# Patient Record
Sex: Female | Born: 1940 | Race: White | Hispanic: No | Marital: Married | Smoking: Never smoker
Health system: Southern US, Community
[De-identification: ages and names within clinical notes are randomized; demographics above are authoritative.]

## PROBLEM LIST (undated history)

## (undated) DIAGNOSIS — Z9889 Other specified postprocedural states: Secondary | ICD-10-CM

## (undated) DIAGNOSIS — K2270 Barrett's esophagus without dysplasia: Secondary | ICD-10-CM

## (undated) DIAGNOSIS — I10 Essential (primary) hypertension: Secondary | ICD-10-CM

## (undated) DIAGNOSIS — R000 Tachycardia, unspecified: Secondary | ICD-10-CM

## (undated) DIAGNOSIS — R112 Nausea with vomiting, unspecified: Secondary | ICD-10-CM

## (undated) DIAGNOSIS — Z8719 Personal history of other diseases of the digestive system: Secondary | ICD-10-CM

## (undated) DIAGNOSIS — G43909 Migraine, unspecified, not intractable, without status migrainosus: Secondary | ICD-10-CM

## (undated) DIAGNOSIS — K5792 Diverticulitis of intestine, part unspecified, without perforation or abscess without bleeding: Secondary | ICD-10-CM

## (undated) DIAGNOSIS — M461 Sacroiliitis, not elsewhere classified: Secondary | ICD-10-CM

## (undated) DIAGNOSIS — M069 Rheumatoid arthritis, unspecified: Secondary | ICD-10-CM

## (undated) DIAGNOSIS — Z853 Personal history of malignant neoplasm of breast: Secondary | ICD-10-CM

## (undated) DIAGNOSIS — C801 Malignant (primary) neoplasm, unspecified: Secondary | ICD-10-CM

## (undated) DIAGNOSIS — L309 Dermatitis, unspecified: Secondary | ICD-10-CM

## (undated) HISTORY — DX: Tachycardia, unspecified: R00.0

## (undated) HISTORY — DX: Barrett's esophagus without dysplasia: K22.70

## (undated) HISTORY — DX: Dermatitis, unspecified: L30.9

## (undated) HISTORY — PX: WEDGE RESECTION: SHX5070

## (undated) HISTORY — PX: ABDOMINAL HYSTERECTOMY: SHX81

## (undated) HISTORY — DX: Essential (primary) hypertension: I10

## (undated) HISTORY — DX: Diverticulitis of intestine, part unspecified, without perforation or abscess without bleeding: K57.92

## (undated) HISTORY — DX: Personal history of other diseases of the digestive system: Z87.19

## (undated) HISTORY — DX: Sacroiliitis, not elsewhere classified: M46.1

## (undated) HISTORY — DX: Personal history of malignant neoplasm of breast: Z85.3

## (undated) HISTORY — DX: Rheumatoid arthritis, unspecified: M06.9

---

## 1996-12-26 HISTORY — PX: BREAST LUMPECTOMY: SHX2

## 2020-01-27 DIAGNOSIS — G459 Transient cerebral ischemic attack, unspecified: Secondary | ICD-10-CM

## 2020-01-27 HISTORY — DX: Transient cerebral ischemic attack, unspecified: G45.9

## 2020-03-26 HISTORY — PX: NEUROMA SURGERY: SHX722

## 2020-06-19 NOTE — Progress Notes (Signed)
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   History of Present Illness: Victoria Sharp is a 79 y.o. female seen in consultation per request of her PCP.  According to the patient about 14 months ago she started having pain and swelling in her both hands.  She was having difficulty making a fist.  She also was experiencing discomfort in her right foot for which she was seen by podiatrist.  She underwent right Morton's neuroma surgery and her symptoms improved as regards to her foot.  She states the symptoms of swelling in her hands continue to get worse and recently she started having increased pain and swelling in her left fifth finger.  She states she was seen by her PCP in May 2021 at the time labs showed positive rheumatoid factor.  She continued to have increased swelling and she was given prednisone for 1 week which was very helpful.  She was also placed on methotrexate 4 tablets p.o. weekly which she is taken for last 3 weeks.  She has noticed improvement in her symptoms.  None of the other joints are painful or swollen.  She has been tolerating methotrexate well.  There is no family history of rheumatoid arthritis.  Activities of Daily Living:  Patient reports morning stiffness for 45 minutes.   Patient Denies nocturnal pain.  Difficulty dressing/grooming: Denies Difficulty climbing stairs: Denies Difficulty getting out of chair: Denies Difficulty using hands for taps, buttons, cutlery, and/or writing: Denies  Review of Systems  Constitutional: Positive for fatigue. Negative for night sweats, weight gain and weight loss.  HENT: Negative for mouth sores, trouble swallowing, trouble swallowing, mouth dryness and  nose dryness.   Eyes: Positive for dryness. Negative for pain, redness and visual disturbance.  Respiratory: Negative for cough, shortness of breath and difficulty breathing.   Cardiovascular: Negative for chest pain, palpitations, hypertension, irregular heartbeat and swelling in legs/feet.  Gastrointestinal: Positive for diarrhea. Negative for blood in stool and constipation.       Occasional  Endocrine: Negative for increased urination.  Genitourinary: Negative for vaginal dryness.  Musculoskeletal: Positive for arthralgias, joint pain, joint swelling and morning stiffness. Negative for myalgias, muscle weakness, muscle tenderness and myalgias.  Skin: Negative for color change, rash, hair loss, skin tightness, ulcers and sensitivity to sunlight.  Allergic/Immunologic: Negative for susceptible to infections.  Neurological: Negative for dizziness, memory loss, night sweats and weakness.  Hematological: Negative for swollen glands.  Psychiatric/Behavioral: Negative for depressed mood and sleep disturbance. The patient is not nervous/anxious.     PMFS History:  There are no problems to display for this patient.   Past Medical History:  Diagnosis Date  . History of breast cancer   . Hypertension   . Rheumatoid arthritis ([place])   . Tachycardia     Family History  Problem Relation Age of Onset  . Heart attack Father   . Kidney disease Brother   . Cancer Brother    Past Surgical History:  Procedure Laterality Date  . ABDOMINAL HYSTERECTOMY    . BREAST LUMPECTOMY Left 1998  . NEUROMA SURGERY Right 03/2020  .  WEDGE RESECTION     Social History   Social History Narrative  . Not on file    There is no immunization history on file for this patient.   Objective: Vital Signs: BP (!) 158/98 (BP Location: Right Arm, Patient Position: Sitting, Cuff Size: Small)   Pulse 77   Resp 12   Ht _0  (1.575 m)   Wt 148 lb 6.4 oz (67.3 kg)   BMI 27.14 kg/m    Physical Exam Vitals and  nursing note reviewed.  Constitutional:      Appearance: She is well-developed.  HENT:     Head: Normocephalic and atraumatic.  Eyes:     Conjunctiva/sclera: Conjunctivae normal.  Cardiovascular:     Rate and Rhythm: Normal rate and regular rhythm.     Heart sounds: Normal heart sounds.  Pulmonary:     Effort: Pulmonary effort is normal.     Breath sounds: Normal breath sounds.  Abdominal:     General: Bowel sounds are normal.     Palpations: Abdomen is soft.  Musculoskeletal:     Cervical back: Normal range of motion.  Lymphadenopathy:     Cervical: No cervical adenopathy.  Skin:    General: Skin is warm and dry.     Capillary Refill: Capillary refill takes less than 2 seconds.  Neurological:     Mental Status: She is alert and oriented to person, place, and time.  Psychiatric:        Behavior: Behavior normal.      Musculoskeletal Exam: C-spine thoracic and lumbar spine with good range of motion.  CDAI Exam: CDAI Score: 13  Patient Global: 5 mm; Provider Global: 5 mm Swollen: 6 ; Tender: 7  Joint Exam 06/26/2020      Right  Left  MCP 2  Swollen Tender  Swollen Tender  MCP 3  Swollen Tender     MCP 5  Swollen Tender  Swollen Tender  PIP 5     Swollen Tender  Tarsometatarsal   Tender        Investigation: No additional findings. Labs from [facility] May 31, 2020 CBC normal,  RF 320,Anti-CCP 130.41, ESR 14, CRP less than 5, ANA 1: 160 homogeneous,  September 16, 2019 CMP showed GFR 52  March 06, 2019 hepatitis C nonreactive  Imaging: No results found.  Recent Labs: No results found for: WBC, HGB, PLT, NA, K, CL, CO2, GLUCOSE, BUN, CREATININE, BILITOT, ALKPHOS, AST, ALT, PROT, ALBUMIN, CALCIUM, GFRAA, QFTBGOLD, QFTBGOLDPLUS  Speciality Comments: No specialty comments available.  Procedures:  No procedures performed Allergies: Contrast media [iodinated diagnostic agents], Latex, Metronidazole, and Sulfamethoxazole-trimethoprim   Assessment  / Plan:     Visit Diagnoses: Rheumatoid arthritis with rheumatoid factor of multiple sites without organ or systems involvement ([place]) - 05/14/20: RF 320, CCP 130.41, ESR 14, CRP<5.  Patient has synovitis in several of her MCPs and PIPs today.  She also has positive rheumatoid factor and positive anti-CCP.  We are detailed discussion regarding rheumatoid arthritis.  She did better after the course of prednisone.  She was also placed on methotrexate about 3 weeks ago.  She has noticed some improvement in her symptoms.  Patient has low GFR.  I would like to repeat labs today.  We may have to switch her to some other medication because of low GFR if it deteriorates further.  I discussed possible use of leflunomide.  She will also need some baseline labs and a chest x-ray.  Pain in  both hands -she has pain and swelling in her MCPs and PIPs.  Plan: XR Hand 2 View Right, XR Hand 2 View Left.  X-ray showed osteoarthritic changes.  Pain in both feet -she complains of some discomfort in her feet but no tenderness was noted.  She had recent Morton's neuroma resection which was helpful.  Plan: XR Foot 2 Views Right, XR Foot 2 Views Left.  X-ray showed osteoarthritic changes.  High risk medication use - Plan: DG Chest 2 View, CBC with Differential/Platelet, COMPLETE METABOLIC PANEL WITH GFR, Hepatitis B core antibody, IgM, Hepatitis B surface antigen, HIV Antibody (routine testing w rflx), QuantiFERON-TB Gold Plus, Serum protein electrophoresis with reflex, IgG, IgA, IgM  Positive ANA (antinuclear antibody) -patient is low titer ANA does not have any clinical features of autoimmune disease.  Plan: RNP Antibody, Sjogrens syndrome-A extractable nuclear antibody, Sjogrens syndrome-B extractable nuclear antibody, Anti-Smith antibody, Anti-DNA antibody, double-stranded  Essential hypertension-blood pressure is elevated today.  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  Vitamin D deficiency-in the past per patient.  History of breast cancer - 1998, left breast, lumpectomy and RTx   Orders: Orders Placed This Encounter  Procedures  . XR Hand 2 View Right  . XR Hand 2 View Left  . XR Foot 2 Views Right  . XR Foot 2 Views Left  . DG Chest 2 View  . CBC with Differential/Platelet  . COMPLETE METABOLIC PANEL WITH GFR  . Hepatitis B core antibody, IgM  . Hepatitis B surface antigen  . HIV Antibody (routine testing w rflx)  . QuantiFERON-TB Gold Plus  . Serum protein electrophoresis with reflex  . IgG, IgA, IgM  . RNP Antibody  . Sjogrens syndrome-A extractable nuclear antibody  . Sjogrens syndrome-B extractable nuclear antibody  . Anti-Smith antibody  . Anti-DNA antibody, double-stranded   No orders of the defined types were placed in this encounter.    Follow-Up Instructions: Return in about 2 weeks (around 07/10/2020) for Rheumatoid arthritis.   Bo Merino, MD  Note - This record has been created using Editor, commissioning.  Chart creation errors have been sought, but may not always  have been located. Such creation errors do not reflect on  the standard of medical care.

## 2020-06-26 ENCOUNTER — Ambulatory Visit: Payer: Self-pay

## 2020-06-26 ENCOUNTER — Encounter (INDEPENDENT_AMBULATORY_CARE_PROVIDER_SITE_OTHER): Payer: Self-pay

## 2020-06-26 ENCOUNTER — Other Ambulatory Visit: Payer: Self-pay

## 2020-06-26 ENCOUNTER — Ambulatory Visit (HOSPITAL_BASED_OUTPATIENT_CLINIC_OR_DEPARTMENT_OTHER)
Admission: RE | Admit: 2020-06-26 | Discharge: 2020-06-26 | Disposition: A | Discharge status: Home / Self Care | Payer: Medicare Other | Source: Ambulatory Visit | Attending: Rheumatology | Admitting: Rheumatology

## 2020-06-26 ENCOUNTER — Encounter: Payer: Self-pay | Admitting: Rheumatology

## 2020-06-26 ENCOUNTER — Ambulatory Visit (INDEPENDENT_AMBULATORY_CARE_PROVIDER_SITE_OTHER): Payer: Medicare Other | Admitting: Rheumatology

## 2020-06-26 VITALS — BP 158/98 | HR 77 | Resp 12 | Ht 62.0 in | Wt 148.4 lb

## 2020-06-26 DIAGNOSIS — M79642 Pain in left hand: Secondary | ICD-10-CM

## 2020-06-26 DIAGNOSIS — M0579 Rheumatoid arthritis with rheumatoid factor of multiple sites without organ or systems involvement: Secondary | ICD-10-CM

## 2020-06-26 DIAGNOSIS — M79672 Pain in left foot: Secondary | ICD-10-CM

## 2020-06-26 DIAGNOSIS — M79671 Pain in right foot: Secondary | ICD-10-CM

## 2020-06-26 DIAGNOSIS — I341 Nonrheumatic mitral (valve) prolapse: Secondary | ICD-10-CM

## 2020-06-26 DIAGNOSIS — M79641 Pain in right hand: Secondary | ICD-10-CM

## 2020-06-26 DIAGNOSIS — Z79899 Other long term (current) drug therapy: Secondary | ICD-10-CM | POA: Diagnosis present

## 2020-06-26 DIAGNOSIS — R768 Other specified abnormal immunological findings in serum: Secondary | ICD-10-CM

## 2020-06-26 DIAGNOSIS — E559 Vitamin D deficiency, unspecified: Secondary | ICD-10-CM

## 2020-06-26 DIAGNOSIS — I10 Essential (primary) hypertension: Secondary | ICD-10-CM

## 2020-06-26 DIAGNOSIS — Z8719 Personal history of other diseases of the digestive system: Secondary | ICD-10-CM

## 2020-06-26 DIAGNOSIS — N183 Chronic kidney disease, stage 3 unspecified: Secondary | ICD-10-CM

## 2020-06-26 DIAGNOSIS — Z853 Personal history of malignant neoplasm of breast: Secondary | ICD-10-CM

## 2020-06-26 IMAGING — DX DG CHEST 2V
2 series · 2 of 2 positions shown · non-contrast
Comparison: None.

CLINICAL DATA: Recent diagnosis of rheumatoid arthritis.

EXAM:
CHEST - 2 VIEW

[chest pa]
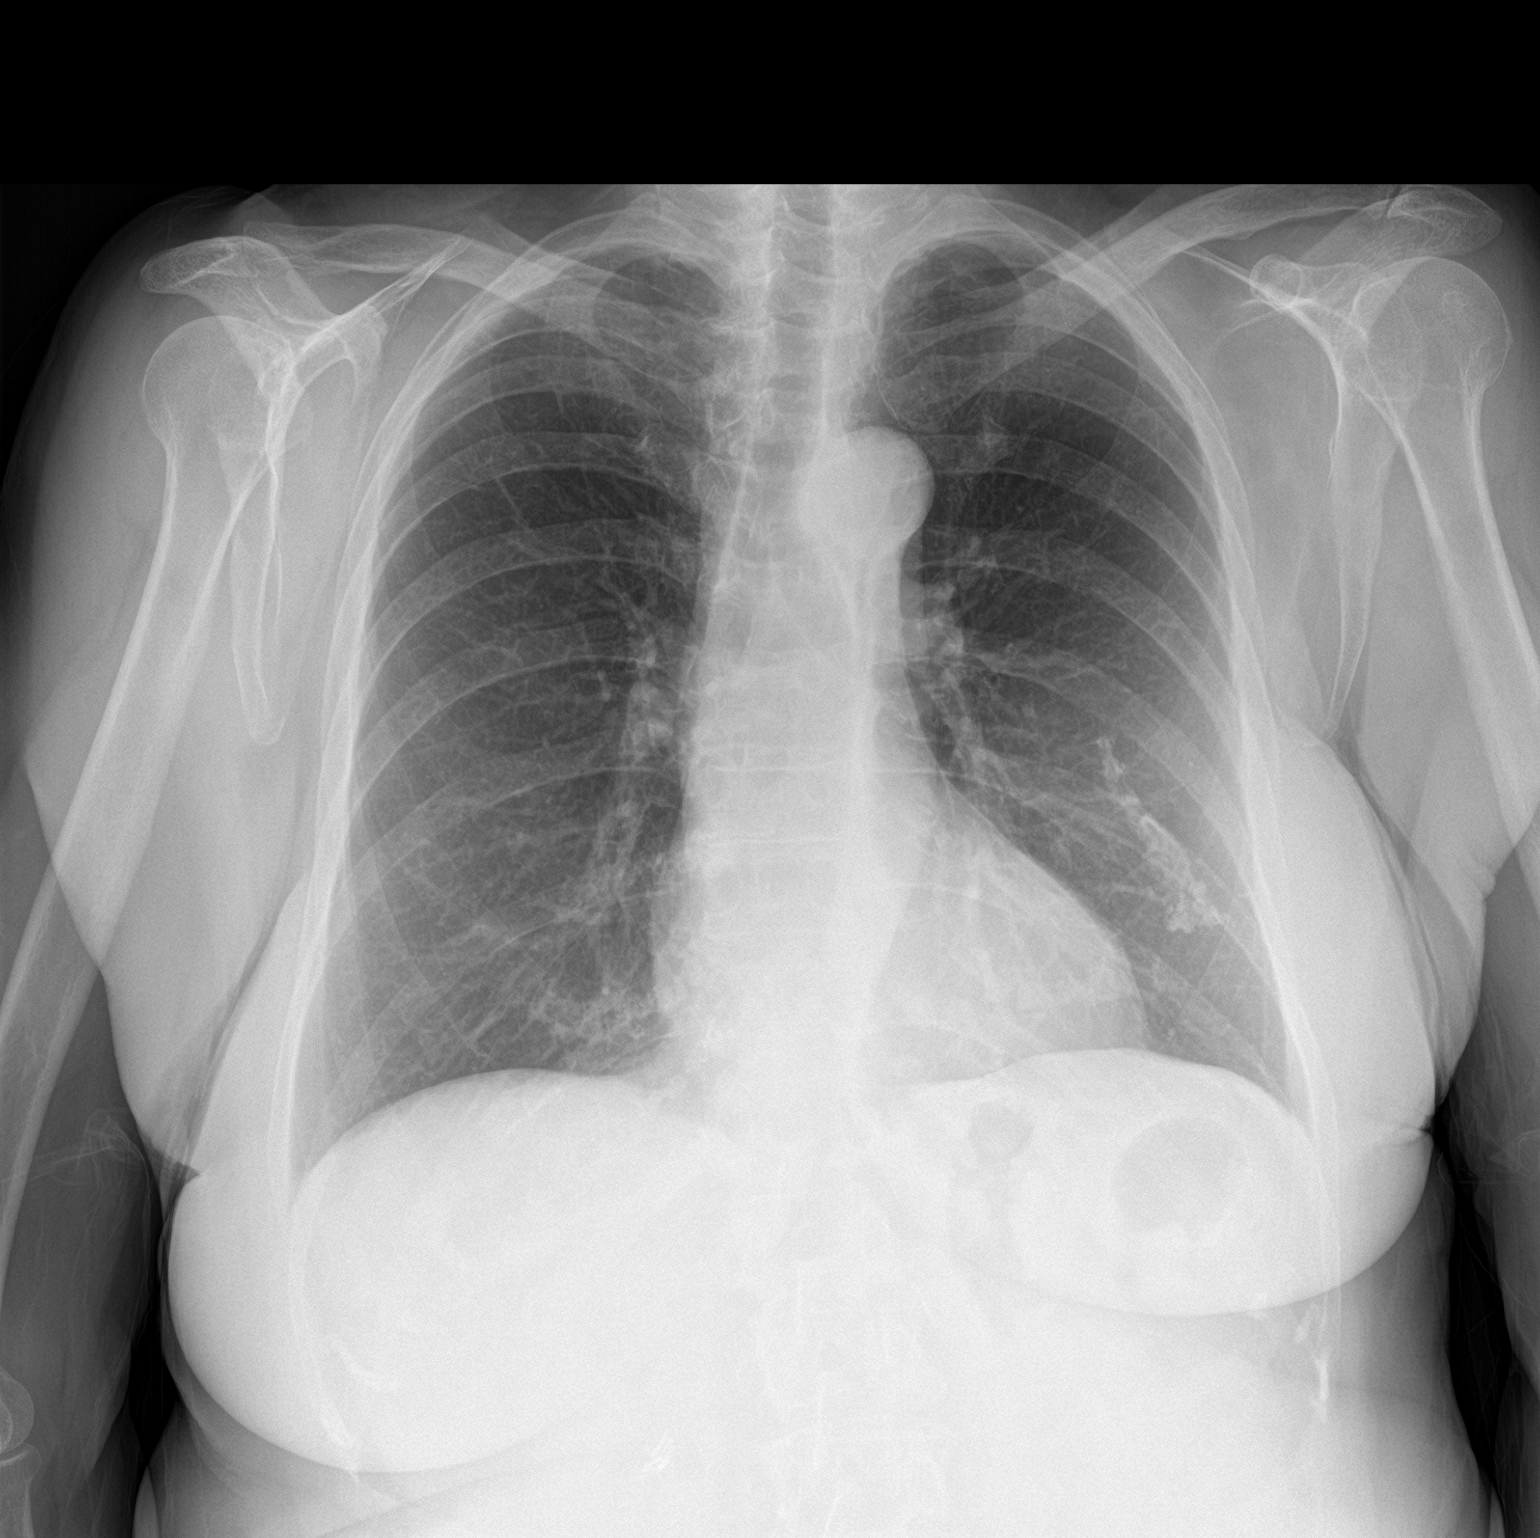

[chest lat]
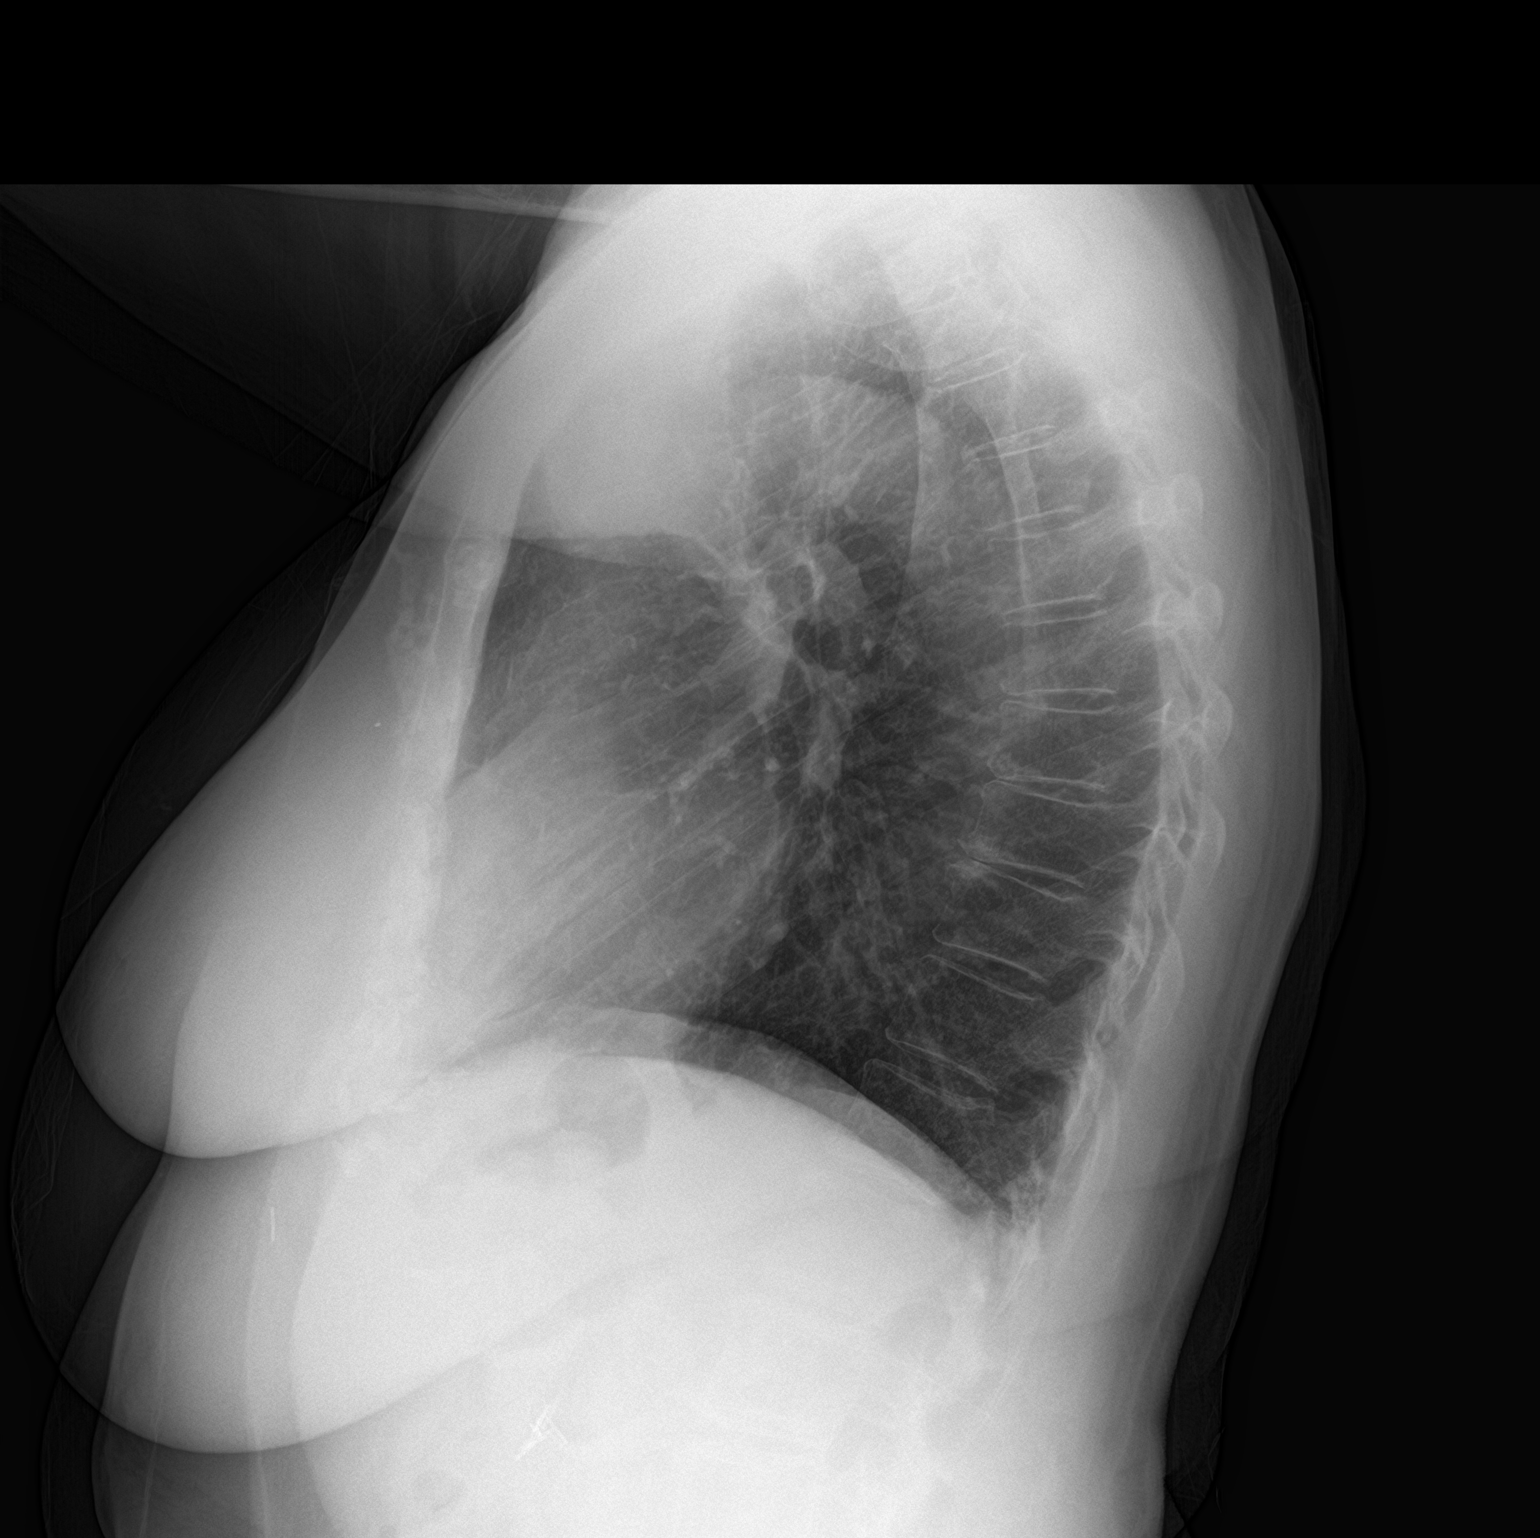

[2 of 2 positions shown; findings below may reference images not displayed]

FINDINGS: The lungs are clear. Heart size is normal. No pneumothorax or
pleural fluid. No acute or focal bony abnormality. Thin
calcification in the anterior left chest wall is likely due to some
prior injury.
IMPRESSION: No acute disease.

## 2020-06-26 NOTE — Patient Instructions (Signed)
Rheumatoid Arthritis Rheumatoid arthritis (RA) is a long-term (chronic) disease. RA causes inflammation in your joints. Your joints may feel painful, stiff, swollen, and warm. RA may start slowly. It most often affects the small joints of the hands and feet. It can also affect other parts of the body. Symptoms of RA often come and go. There is no cure for RA, but medicines can help your symptoms. What are the causes?  RA is an autoimmune disease. This means that your body's defense system (immune system) attacks healthy parts of your body by mistake. The exact cause of RA is not known. What increases the risk?  Being a woman.  Having a family history of RA or other diseases like RA.  Smoking.  Being overweight.  Being exposed to pollutants or chemicals. What are the signs or symptoms?  Morning stiffness that lasts longer than 30 minutes. This is often the first symptom.  Symptoms start slowly. They are often worse in the morning.  As RA gets worse, symptoms may include: ? Pain, stiffness, swelling, warmth, and tenderness in joints on both sides of your body. ? Loss of energy. ? Not feeling hungry. ? Weight loss. ? A low fever. ? Dry eyes and a dry mouth. ? Firm lumps that grow under your skin. ? Changes in the way your joints look. ? Changes in the way your joints work.  Symptoms vary and they: ? Often come and go. ? Sometimes get worse for a period of time. These are called flares. How is this treated?   Treatment may include: ? Taking good care of yourself. Be sure to rest as needed, eat a healthy diet, and exercise. ? Medicines. These may include:  Pain relievers.  Medicines to help with inflammation.  Disease-modifying antirheumatic drugs (DMARDs).  Medicines called biologic response modifiers. ? Physical therapy and occupational therapy. ? Surgery, if joint damage is very bad. Your doctor will work with you to find the best treatments. Follow these  instructions at home: Activity  Return to your normal activities as told by your doctor. Ask your doctor what activities are safe for you.  Rest when you have a flare.  Exercise as told by your doctor. General instructions  Take over-the-counter and prescription medicines only as told by your doctor.  Keep all follow-up visits as told by your doctor. This is important. Where to find more information  American College of Rheumatology: www.rheumatology.org  Arthritis Foundation: www.arthritis.org Contact a doctor if:  You have a flare.  You have a fever.  You have problems because of your medicines. Get help right away if:  You have chest pain.  You have trouble breathing.  You get a hot, painful joint all of a sudden, and it is worse than your normal joint aches. Summary  RA is a long-term disease.  Symptoms of RA start slowly. They are often worse in the morning.  RA causes inflammation in your joints. This information is not intended to replace advice given to you by your health care provider. Make sure you discuss any questions you have with your health care provider. Document Revised: 08/15/2018 Document Reviewed: 08/15/2018 Elsevier Patient Education  2020 [employer]  

## 2020-06-28 NOTE — Progress Notes (Signed)
Chest x-ray is normal.  There is calcification in the anterior chest wall likely due to some prior injury.  Please confirm with patient.

## 2020-06-30 LAB — PROTEIN ELECTROPHORESIS, SERUM, WITH REFLEX
Albumin ELP: 4.1 g/dL (ref 3.8–4.8)
Alpha 1: 0.3 g/dL (ref 0.2–0.3)
Alpha 2: 0.7 g/dL (ref 0.5–0.9)
Beta 2: 0.4 g/dL (ref 0.2–0.5)
Beta Globulin: 0.5 g/dL (ref 0.4–0.6)
Gamma Globulin: 0.8 g/dL (ref 0.8–1.7)
Total Protein: 6.9 g/dL (ref 6.1–8.1)

## 2020-06-30 LAB — CBC WITH DIFFERENTIAL/PLATELET
Absolute Monocytes: 462 cells/uL (ref 200–950)
Basophils Absolute: 30 cells/uL (ref 0–200)
Basophils Relative: 0.5 %
Eosinophils Absolute: 150 cells/uL (ref 15–500)
Eosinophils Relative: 2.5 %
HCT: 43.8 % (ref 35.0–45.0)
Hemoglobin: 14.8 g/dL (ref 11.7–15.5)
Lymphs Abs: 1410 cells/uL (ref 850–3900)
MCH: 31 pg (ref 27.0–33.0)
MCHC: 33.8 g/dL (ref 32.0–36.0)
MCV: 91.6 fL (ref 80.0–100.0)
MPV: 11.4 fL (ref 7.5–12.5)
Monocytes Relative: 7.7 %
Neutro Abs: 3948 cells/uL (ref 1500–7800)
Neutrophils Relative %: 65.8 %
Platelets: 286 10*3/uL (ref 140–400)
RBC: 4.78 10*6/uL (ref 3.80–5.10)
RDW: 13 % (ref 11.0–15.0)
Total Lymphocyte: 23.5 %
WBC: 6 10*3/uL (ref 3.8–10.8)

## 2020-06-30 LAB — QUANTIFERON-TB GOLD PLUS
Mitogen-NIL: >10 IU/mL
NIL: 0.04 IU/mL
QuantiFERON-TB Gold Plus: NEGATIVE
TB1-NIL: 0.04 IU/mL
TB2-NIL: 0.08 IU/mL

## 2020-06-30 LAB — RNP ANTIBODY: Ribonucleic Protein(ENA) Antibody, IgG: <1 AI

## 2020-06-30 LAB — COMPLETE METABOLIC PANEL WITH GFR
AG Ratio: 1.6 (calc) (ref 1.0–2.5)
ALT: 12 U/L (ref 6–29)
AST: 18 U/L (ref 10–35)
Albumin: 4.4 g/dL (ref 3.6–5.1)
Alkaline phosphatase (APISO): 76 U/L (ref 37–153)
BUN/Creatinine Ratio: 18 (calc) (ref 6–22)
BUN: 18 mg/dL (ref 7–25)
CO2: 30 mmol/L (ref 20–32)
Calcium: 9.8 mg/dL (ref 8.6–10.4)
Chloride: 105 mmol/L (ref 98–110)
Creat: 0.99 mg/dL — ABNORMAL HIGH (ref 0.60–0.93)
GFR, Est African American: 64 mL/min/{1.73_m2} (ref >=60)
GFR, Est Non African American: 55 mL/min/{1.73_m2} — ABNORMAL LOW (ref >=60)
Globulin: 2.7 g/dL (calc) (ref 1.9–3.7)
Glucose, Bld: 87 mg/dL (ref 65–99)
Potassium: 4.3 mmol/L (ref 3.5–5.3)
Sodium: 140 mmol/L (ref 135–146)
Total Bilirubin: 0.7 mg/dL (ref 0.2–1.2)
Total Protein: 7.1 g/dL (ref 6.1–8.1)

## 2020-06-30 LAB — SJOGRENS SYNDROME-A EXTRACTABLE NUCLEAR ANTIBODY: SSA (Ro) (ENA) Antibody, IgG: <1 AI

## 2020-06-30 LAB — HIV ANTIBODY (ROUTINE TESTING W REFLEX): HIV 1&2 Ab, 4th Generation: NONREACTIVE

## 2020-06-30 LAB — IGG, IGA, IGM
IgG (Immunoglobin G), Serum: 882 mg/dL (ref 600–1540)
IgM, Serum: 40 mg/dL — ABNORMAL LOW (ref 50–300)
Immunoglobulin A: 283 mg/dL (ref 70–320)

## 2020-06-30 LAB — HEPATITIS B SURFACE ANTIGEN: Hepatitis B Surface Ag: NONREACTIVE

## 2020-06-30 LAB — SJOGRENS SYNDROME-B EXTRACTABLE NUCLEAR ANTIBODY: SSB (La) (ENA) Antibody, IgG: <1 AI

## 2020-06-30 LAB — HEPATITIS B CORE ANTIBODY, IGM: Hep B C IgM: NONREACTIVE

## 2020-06-30 LAB — ANTI-DNA ANTIBODY, DOUBLE-STRANDED: ds DNA Ab: <1 IU/mL

## 2020-06-30 LAB — ANTI-SMITH ANTIBODY: ENA SM Ab Ser-aCnc: <1 AI

## 2020-06-30 NOTE — Progress Notes (Signed)
I will discuss results at the follow-up visit.

## 2020-07-07 ENCOUNTER — Telehealth: Payer: Self-pay | Admitting: Rheumatology

## 2020-07-07 NOTE — Telephone Encounter (Signed)
Please a schedule an appointment to discuss leflunomide

## 2020-07-07 NOTE — Telephone Encounter (Signed)
Patient would like to keep her appointment on 07/14/2020 and Dr. Estanislado Pandy will discuss [place] with patient.

## 2020-07-07 NOTE — Telephone Encounter (Signed)
Patient requesting a call back because she took 4th or 5th  dose of MTX yesterday. Patient got sick afterwards. Nauseous, chills, dry heaves, lots of mucus in throat and nose, diarrhea for two weeks now. Please call to discuss.

## 2020-07-07 NOTE — Telephone Encounter (Signed)
Patient has been experiencing nausea, dry heaves, chills, mucus in throat and nose and diarrhea after last dose of methotrexate yesterday. Patient has been on methotrexate since 06/01/2020 and the symptoms started with stomach ache and dizziness. The symptoms have worsened with each dose. Patient is taking methotrexate 4 tablets by mouth once weekly. Methotrexate was prescribed by Idamae Schuller (PCP). Patient saw you as a new patient on 06/26/2020 and has a follow up on 07/14/2020. Possible use of leflunomide was discussed at new patient visit. Please advise.

## 2020-07-11 NOTE — Progress Notes (Signed)
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   History of Present Illness: Victoria Sharp is a 79 y.o. female with history of seropositive rheumatoid arthritis and osteoarthritis.  She states she was doing well on methotrexate until she started having diarrhea and then she stopped the medication about 2 weeks ago.  She states all her joint pain and swelling has resolved.  She has very minimal discomfort and stiffness now.  She wants to discuss other treatment options.  Activities of Daily Living:  Patient reports morning stiffness for 45 minutes.   Patient Denies nocturnal pain.  Difficulty dressing/grooming: Denies Difficulty climbing stairs: Reports Difficulty getting out of chair: Reports Difficulty using hands for taps, buttons, cutlery, and/or writing: Reports  Review of Systems  Constitutional: Positive for fatigue. Negative for night sweats, weight gain and weight loss.  HENT: Positive for mouth dryness. Negative for mouth sores, trouble swallowing, trouble swallowing and nose dryness.   Eyes: Positive for dryness. Negative for pain, redness, itching and visual disturbance.  Respiratory: Negative for cough, shortness of breath and difficulty breathing.   Cardiovascular: Negative for chest pain, palpitations, hypertension, irregular heartbeat and swelling in legs/feet.  Gastrointestinal: Positive for diarrhea. Negative for blood in stool and constipation.  Endocrine: Positive for increased urination.  Genitourinary: Negative for difficulty urinating and vaginal dryness.  Musculoskeletal: Positive for arthralgias, joint pain, joint swelling and morning stiffness. Negative for myalgias, muscle weakness, muscle  tenderness and myalgias.  Skin: Positive for color change. Negative for rash, hair loss, redness, skin tightness, ulcers and sensitivity to sunlight.  Allergic/Immunologic: Negative for susceptible to infections.  Neurological: Negative for dizziness, numbness, headaches, memory loss, night sweats and weakness.  Hematological: Negative for bruising/bleeding tendency and swollen glands.  Psychiatric/Behavioral: Negative for depressed mood, confusion and sleep disturbance. The patient is not nervous/anxious.     PMFS History:  Patient Active Problem List   Diagnosis Date Noted  . Rheumatoid arthritis with rheumatoid factor of multiple sites without organ or systems involvement ([place]) 06/26/2020  . Essential hypertension 06/26/2020  . MVP (mitral valve prolapse) 06/26/2020  . History of Barrett's esophagus 06/26/2020  . History of gastroesophageal reflux (GERD) 06/26/2020  . History of breast cancer 06/26/2020  . High risk medication use 06/26/2020    Past Medical History:  Diagnosis Date  . History of breast cancer   . Hypertension   . Rheumatoid arthritis ([place])   . Tachycardia     Family History  Problem Relation Age of Onset  . Heart attack Father   . Kidney disease Brother   . Cancer Brother    Past Surgical History:  Procedure Laterality Date  . ABDOMINAL HYSTERECTOMY    . BREAST LUMPECTOMY Left 1998  . NEUROMA SURGERY Right 03/2020  . WEDGE RESECTION     Social History   Social History Narrative  . Not on file    There is no immunization history on file for this patient.   Objective: Vital Signs: BP (!) 152/95 (BP Location: Left Arm, Patient Position: Sitting, Cuff Size: Small)   Pulse 75   Resp 12   Ht  5\' 2"  (1.575 m)   Wt 146 lb 12.8 oz (66.6 kg)   BMI 26.85 kg/m    Physical Exam Vitals and nursing note reviewed.  Constitutional:      Appearance: She is well-developed.  HENT:     Head: Normocephalic and atraumatic.  Eyes:     Conjunctiva/sclera:  Conjunctivae normal.  Cardiovascular:     Rate and Rhythm: Normal rate and regular rhythm.     Heart sounds: Normal heart sounds.  Pulmonary:     Effort: Pulmonary effort is normal.     Breath sounds: Normal breath sounds.  Abdominal:     General: Bowel sounds are normal.     Palpations: Abdomen is soft.  Musculoskeletal:     Cervical back: Normal range of motion.  Lymphadenopathy:     Cervical: No cervical adenopathy.  Skin:    General: Skin is warm and dry.     Capillary Refill: Capillary refill takes less than 2 seconds.  Neurological:     Mental Status: She is alert and oriented to person, place, and time.  Psychiatric:        Behavior: Behavior normal.      Musculoskeletal Exam: C-spine thoracic and lumbar spine with good range of motion.  Shoulder joints, elbow joints, wrist joints, MCPs PIPs and DIPs with good range of motion with no synovitis.  She had mild tenderness on palpation over left fifth MCP joint.  Hip joints, knee joints, ankles with good range of motion.  She had bilateral dorsal spurring and PIP and DIP thickening.  CDAI Exam: CDAI Score: 1.6  Patient Global: 3 mm; Provider Global: 3 mm Swollen: 0 ; Tender: 1  Joint Exam 07/14/2020      Right  Left  MCP 5      Tender     Investigation: No additional findings.  Imaging: DG Chest 2 View  Result Date: 06/27/2020 CLINICAL DATA:  Recent diagnosis of rheumatoid arthritis. EXAM: CHEST - 2 VIEW COMPARISON:  None. FINDINGS: The lungs are clear. Heart size is normal. No pneumothorax or pleural fluid. No acute or focal bony abnormality. Thin calcification in the anterior left chest wall is likely due to some prior injury. IMPRESSION: No acute disease. Electronically Signed   By: Inge Rise M.D.   On: 06/27/2020 15:35   XR Foot 2 Views Left  Result Date: 06/26/2020 First MTP narrowing and subluxation with valgus deformity was noted.  PIP and DIP narrowing was noted.  No other MTP joint narrowing was noted.   No intertarsal or tibiotalar joint space narrowing was noted.  Inferior and posterior calcaneal spurs were noted.  No erosive changes were noted. Impression: These findings are consistent with osteoarthritis of the foot.  XR Foot 2 Views Right  Result Date: 06/26/2020 First MTP severe narrowing and subluxation was noted.  PIP and DIP narrowing was noted.  None of the other MTP showed narrowing.  No intertarsal tibiotalar joint space narrowing was noted.  Inferior calcaneal spur was noted.  No erosive changes were noted. Impression: These findings are consistent with osteoarthritis of the foot.  XR Hand 2 View Left  Result Date: 06/26/2020 CMC, PIP and DIP narrowing was noted.  No MCP, intercarpal or radiocarpal joint space narrowing was noted.  No erosive changes were noted. Impression: These findings are consistent with osteoarthritis of the hand.  XR Hand 2 View Right  Result Date: 06/26/2020 CMC, PIP and DIP narrowing was noted.  No MCP, intercarpal or radiocarpal joint space narrowing  was noted.  No erosive changes were noted. Impression: X-rays were consistent with osteoarthritis.   Recent Labs: Lab Results  Component Value Date   WBC 6.0 06/26/2020   HGB 14.8 06/26/2020   PLT 286 06/26/2020   NA 140 06/26/2020   K 4.3 06/26/2020   CL 105 06/26/2020   CO2 30 06/26/2020   GLUCOSE 87 06/26/2020   BUN 18 06/26/2020   CREATININE 0.99 (H) 06/26/2020   BILITOT 0.7 06/26/2020   AST 18 06/26/2020   ALT 12 06/26/2020   PROT 7.1 06/26/2020   PROT 6.9 06/26/2020   CALCIUM 9.8 06/26/2020   GFRAA 64 06/26/2020   QFTBGOLDPLUS NEGATIVE 06/26/2020   Labs from [facility] May 31, 2020 RF 320, anti-CCP 130.41, ANA 1: 160 homogeneous March 06, 2019 hepatitis C negative June 26, 2020 SPEP normal, IgM low at 40 IgG and IgA normal, TB Gold negative, hepatitis B negative, HIV negative, ENA negative   Speciality Comments: No specialty comments available.  Procedures:  No procedures  performed Allergies: Contrast media [iodinated diagnostic agents], Latex, Metronidazole, and Sulfamethoxazole-trimethoprim   Assessment / Plan:     Visit Diagnoses: Rheumatoid arthritis with rheumatoid factor of multiple sites without organ or systems involvement ([place]) - Positive RF, positive anti-CCP, positive ANA, positive synovitis.  She has done really well on methotrexate at low dose but she had to stop it due to diarrhea.  She also had mild elevation of her creatinine.  We had detailed discussion regarding different treatment options and their side effects.  As her disease is not very aggressive now we discussed  starting low-dose of leflunomide.  Indications contraindications and side effects were discussed.  Handout was given and consent was taken.  My plan is to start her on leflunomide 10 mg p.o. daily.  We will check labs in 2 weeks, 2 months and then every 3 months to monitor for drug toxicity.  Medication counseling:   Baseline Immunosuppressant Therapy Labs  Quantiferon TB Gold Latest Ref Rng & Units 06/26/2020  Quantiferon TB Gold Plus NEGATIVE NEGATIVE    Hepatitis Latest Ref Rng & Units 06/26/2020  Hep B Surface Ag NON-REACTI NON-REACTIVE  Hep B IgM NON-REACTI NON-REACTIVE    Lab Results  Component Value Date   HIV NON-REACTIVE 06/26/2020    Immunoglobulin Electrophoresis Latest Ref Rng & Units 06/26/2020  IgA  70 - 320 mg/dL 283  IgG 600 - 1,540 mg/dL 882  IgM 50 - 300 mg/dL 40(L)    Serum Protein Electrophoresis Latest Ref Rng & Units 06/26/2020  Total Protein 6.1 - 8.1 g/dL 6.9  Albumin 3.8 - 4.8 g/dL 4.1  Alpha-1 0.2 - 0.3 g/dL 0.3  Alpha-2 0.5 - 0.9 g/dL 0.7  Beta Globulin 0.4 - 0.6 g/dL 0.5  Beta 2 0.2 - 0.5 g/dL 0.4  Gamma Globulin 0.8 - 1.7 g/dL 0.8    No results found for: G6PDH  No results found for: TPMT   Patient was counseled on the purpose, proper use, and adverse effects of leflunomide including risk of infection, nausea/diarrhea/weight loss,  increase in blood pressure, rash, hair loss, tingling in the hands and feet, and signs and symptoms of interstitial lung disease.   Also counseled on [place] warning of liver injury and importance of avoiding alcohol while on therapy. Discussed that there is the possibility of an increased risk of malignancy but it is not well understood if this increased risk is due to the medication or the disease state.  Counseled patient to  avoid live vaccines. Recommend annual influenza, Pneumovax 23, Prevnar 13, and Shingrix as indicated.   Discussed the importance of frequent monitoring of liver function and blood count.  Standing orders placed.    Provided patient with educational materials on leflunomide and answered all questions.  Patient consented to [place] use, and consent will be uploaded into the media tab.    High risk medication use -patient will be starting leflunomide.  Primary osteoarthritis of both hands-can protection muscle strengthening was discussed.  Primary osteoarthritis of both feet-proper fitting shoes were discussed.  Positive ANA (antinuclear antibody) -she has no clinical features of lupus.  ENA negative.  Essential hypertension-blood pressure is a still elevated.  She should monitor blood pressure closely.  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  Vitamin D deficiency  History of breast cancer - 1998, left breast, lumpectomy and RTx  Orders: No orders of the defined types were placed in this encounter.  Meds ordered this encounter  Medications  . leflunomide (ARAVA) 10 MG tablet    Sig: Take 1 tablet (10 mg total) by mouth daily.    Dispense:  30 tablet    Refill:  2     Follow-Up Instructions: Return in about 6 weeks (around 08/25/2020) for Rheumatoid arthritis,  Osteoarthritis.   Bo Merino, MD  Note - This record has been created using Editor, commissioning.  Chart creation errors have been sought, but may not always  have been located. Such creation errors do not reflect on  the standard of medical care.

## 2020-07-14 ENCOUNTER — Ambulatory Visit (INDEPENDENT_AMBULATORY_CARE_PROVIDER_SITE_OTHER): Payer: Medicare Other | Admitting: Rheumatology

## 2020-07-14 ENCOUNTER — Other Ambulatory Visit: Payer: Self-pay

## 2020-07-14 ENCOUNTER — Encounter: Payer: Self-pay | Admitting: Rheumatology

## 2020-07-14 VITALS — BP 152/95 | HR 75 | Resp 12 | Ht 62.0 in | Wt 146.8 lb

## 2020-07-14 DIAGNOSIS — M19041 Primary osteoarthritis, right hand: Secondary | ICD-10-CM | POA: Diagnosis not present

## 2020-07-14 DIAGNOSIS — R768 Other specified abnormal immunological findings in serum: Secondary | ICD-10-CM

## 2020-07-14 DIAGNOSIS — M19072 Primary osteoarthritis, left ankle and foot: Secondary | ICD-10-CM

## 2020-07-14 DIAGNOSIS — M19042 Primary osteoarthritis, left hand: Secondary | ICD-10-CM

## 2020-07-14 DIAGNOSIS — Z853 Personal history of malignant neoplasm of breast: Secondary | ICD-10-CM

## 2020-07-14 DIAGNOSIS — M19071 Primary osteoarthritis, right ankle and foot: Secondary | ICD-10-CM

## 2020-07-14 DIAGNOSIS — Z79899 Other long term (current) drug therapy: Secondary | ICD-10-CM | POA: Diagnosis not present

## 2020-07-14 DIAGNOSIS — I10 Essential (primary) hypertension: Secondary | ICD-10-CM

## 2020-07-14 DIAGNOSIS — M0579 Rheumatoid arthritis with rheumatoid factor of multiple sites without organ or systems involvement: Secondary | ICD-10-CM

## 2020-07-14 DIAGNOSIS — Z8719 Personal history of other diseases of the digestive system: Secondary | ICD-10-CM

## 2020-07-14 DIAGNOSIS — I341 Nonrheumatic mitral (valve) prolapse: Secondary | ICD-10-CM

## 2020-07-14 DIAGNOSIS — E559 Vitamin D deficiency, unspecified: Secondary | ICD-10-CM

## 2020-07-14 DIAGNOSIS — N183 Chronic kidney disease, stage 3 unspecified: Secondary | ICD-10-CM

## 2020-07-14 MED ORDER — LEFLUNOMIDE 10 MG PO TABS: 10.0000 mg | ORAL_TABLET | Freq: Every day | ORAL | 2 refills | Status: DC

## 2020-07-14 NOTE — Patient Instructions (Addendum)
Standing Labs We placed an order today for your standing lab work.   Please have your standing labs drawn in 2 weeks, 2 months and then every 3 months  If possible, please have your labs drawn 2 weeks prior to your appointment so that the provider can discuss your results at your appointment.  We have open lab daily Monday through Thursday from 8:30-12:30 PM and 1:30-4:30 PM and Friday from 8:30-12:30 PM and 1:30-4:00 PM at the office of Dr. Bo Merino, [facility].   Please be advised, patients with office appointments requiring lab work will take precedents over walk-in lab work.  If possible, please come for your lab work on Monday and Friday afternoons, as you may experience shorter wait times. The office is located at [address], [place], [place], [place] [address] No appointment is necessary.   Labs are drawn by [facility]. Please bring your co-pay at the time of your lab draw.  You may receive a bill from [facility] for your lab work.  If you wish to have your labs drawn at another location, please call the office 24 hours in advance to send orders.  If you have any questions regarding directions or hours of operation,  please call [phone].   As a reminder, please drink plenty of water prior to coming for your lab work. Thanks!     Leflunomide tablets What is this medicine? LEFLUNOMIDE (le FLOO na mide) is for rheumatoid arthritis. This medicine may be used for other purposes; ask your health care provider or pharmacist if you have questions. COMMON BRAND NAME(S): Arava What should I tell my health care provider before I take this medicine? They need to know if you have any of these conditions:  diabetes  have a fever or infection  high blood pressure  immune system problems  kidney disease  liver disease  low blood cell counts, like low white cell, platelet, or red cell counts  lung or breathing disease, like asthma  recently received or  scheduled to receive a vaccine  receiving treatment for cancer  skin conditions or sensitivity  tingling of the fingers or toes, or other nerve disorder  tuberculosis  an unusual or allergic reaction to leflunomide, teriflunomide, other medicines, food, dyes, or preservatives  pregnant or trying to get pregnant  breast-feeding How should I use this medicine? Take this medicine by mouth with a full glass of water. Follow the directions on the prescription label. Take your medicine at regular intervals. Do not take your medicine more often than directed. Do not stop taking except on your doctor's advice. Talk to your pediatrician regarding the use of this medicine in children. Special care may be needed. Overdosage: If you think you have taken too much of this medicine contact a poison control center or emergency room at once. NOTE: This medicine is only for you. Do not share this medicine with others. What if I miss a dose? If you miss a dose, take it as soon as you can. If it is almost time for your next dose, take only that dose. Do not take double or extra doses. What may interact with this medicine? Do not take this medicine with any of the following medications:  teriflunomide This medicine may also interact with the following medications:  alosetron  birth control pills  caffeine  cefaclor  certain medicines for diabetes like nateglinide, repaglinide, rosiglitazone, pioglitazone  certain medicines for high cholesterol like atorvastatin, pravastatin, rosuvastatin, simvastatin  charcoal  cholestyramine  ciprofloxacin  duloxetine  furosemide  ketoprofen  live virus vaccines  medicines that increase your risk for infection  methotrexate  mitoxantrone  paclitaxel  penicillin  theophylline  tizanidine  warfarin This list may not describe all possible interactions. Give your health care provider a list of all the medicines, herbs, non-prescription  drugs, or dietary supplements you use. Also tell them if you smoke, drink alcohol, or use illegal drugs. Some items may interact with your medicine. What should I watch for while using this medicine? Visit your health care provider for regular checks on your progress. Tell your doctor or health care provider if your symptoms do not start to get better or if they get worse. You may need blood work done while you are taking this medicine. This medicine may cause serious skin reactions. They can happen weeks to months after starting the medicine. Contact your health care provider right away if you notice fevers or flu-like symptoms with a rash. The rash may be red or purple and then turn into blisters or peeling of the skin. Or, you might notice a red rash with swelling of the face, lips or lymph nodes in your neck or under your arms. This medicine may stay in your body for up to 2 years after your last dose. Tell your doctor about any unusual side effects or symptoms. A medicine can be given to help lower your blood levels of this medicine more quickly. Women must use effective birth control with this medicine. There is a potential for serious side effects to an unborn child. Do not become pregnant while taking this medicine. Inform your doctor if you wish to become pregnant. This medicine remains in your blood after you stop taking it. You must continue using effective birth control until the blood levels have been checked and they are low enough. A medicine can be given to help lower your blood levels of this medicine more quickly. Immediately talk to your doctor if you think you may be pregnant. You may need a pregnancy test. Talk to your health care provider or pharmacist for more information. You should not receive certain vaccines during your treatment and for a certain time after your treatment with this medication ends. Talk to your health care provider for more information. What side effects may I  notice from receiving this medicine? Side effects that you should report to your doctor or health care professional as soon as possible:  allergic reactions like skin rash, itching or hives, swelling of the face, lips, or tongue  breathing problems  cough  increased blood pressure  low blood counts - this medicine may decrease the number of white blood cells and platelets. You may be at increased risk for infections and bleeding.  pain, tingling, numbness in the hands or feet  rash, fever, and swollen lymph nodes  redness, blistering, peeing or loosening of the skin, including inside the mouth  signs of decreased platelets or bleeding - bruising, pinpoint red spots on the skin, black, tarry stools, blood in urine  signs of infection - fever or chills, cough, sore throat, pain or trouble passing urine  signs and symptoms of liver injury like dark yellow or brown urine; general ill feeling or flu-like symptoms; light-colored stools; loss of appetite; nausea; right upper belly pain; unusually weak or tired; yellowing of the eyes or skin  trouble passing urine or change in the amount of urine  vomiting Side effects that usually do not require medical attention (report  to your doctor or health care professional if they continue or are bothersome):  diarrhea  hair thinning or loss  headache  nausea  tiredness This list may not describe all possible side effects. Call your doctor for medical advice about side effects. You may report side effects to FDA at 1-800-FDA-1088. Where should I keep my medicine? Keep out of the reach of children. Store at room temperature between 15 and 30 degrees C (59 and 86 degrees F). Protect from moisture and light. Throw away any unused medicine after the expiration date. NOTE: This sheet is a summary. It may not cover all possible information. If you have questions about this medicine, talk to your doctor, pharmacist, or health care provider.   2020 Elsevier/Gold Standard (2019-03-15 15:06:48)

## 2020-07-16 ENCOUNTER — Ambulatory Visit: Payer: Self-pay | Admitting: Rheumatology

## 2020-07-30 ENCOUNTER — Telehealth: Payer: Self-pay | Admitting: Rheumatology

## 2020-07-30 DIAGNOSIS — Z79899 Other long term (current) drug therapy: Secondary | ICD-10-CM

## 2020-07-30 NOTE — Telephone Encounter (Signed)
Lab Orders released.  

## 2020-07-30 NOTE — Telephone Encounter (Signed)
Patient going to [facility] tomorrow for lab draw. Please release orders. 

## 2020-08-01 LAB — CBC WITH DIFFERENTIAL/PLATELET
Absolute Monocytes: 655 cells/uL (ref 200–950)
Basophils Absolute: 62 cells/uL (ref 0–200)
Basophils Relative: 1.1 %
Eosinophils Absolute: 258 cells/uL (ref 15–500)
Eosinophils Relative: 4.6 %
HCT: 44.7 % (ref 35.0–45.0)
Hemoglobin: 14.8 g/dL (ref 11.7–15.5)
Lymphs Abs: 1467 cells/uL (ref 850–3900)
MCH: 30.3 pg (ref 27.0–33.0)
MCHC: 33.1 g/dL (ref 32.0–36.0)
MCV: 91.6 fL (ref 80.0–100.0)
MPV: 11.8 fL (ref 7.5–12.5)
Monocytes Relative: 11.7 %
Neutro Abs: 3158 cells/uL (ref 1500–7800)
Neutrophils Relative %: 56.4 %
Platelets: 263 10*3/uL (ref 140–400)
RBC: 4.88 10*6/uL (ref 3.80–5.10)
RDW: 13.4 % (ref 11.0–15.0)
Total Lymphocyte: 26.2 %
WBC: 5.6 10*3/uL (ref 3.8–10.8)

## 2020-08-01 LAB — COMPLETE METABOLIC PANEL WITH GFR
AG Ratio: 1.7 (calc) (ref 1.0–2.5)
ALT: 18 U/L (ref 6–29)
AST: 24 U/L (ref 10–35)
Albumin: 4.4 g/dL (ref 3.6–5.1)
Alkaline phosphatase (APISO): 78 U/L (ref 37–153)
BUN/Creatinine Ratio: 19 (calc) (ref 6–22)
BUN: 20 mg/dL (ref 7–25)
CO2: 25 mmol/L (ref 20–32)
Calcium: 9.5 mg/dL (ref 8.6–10.4)
Chloride: 104 mmol/L (ref 98–110)
Creat: 1.04 mg/dL — ABNORMAL HIGH (ref 0.60–0.93)
GFR, Est African American: 60 mL/min/{1.73_m2} (ref >=60)
GFR, Est Non African American: 52 mL/min/{1.73_m2} — ABNORMAL LOW (ref >=60)
Globulin: 2.6 g/dL (calc) (ref 1.9–3.7)
Glucose, Bld: 87 mg/dL (ref 65–99)
Potassium: 4.6 mmol/L (ref 3.5–5.3)
Sodium: 140 mmol/L (ref 135–146)
Total Bilirubin: 0.7 mg/dL (ref 0.2–1.2)
Total Protein: 7 g/dL (ref 6.1–8.1)

## 2020-08-01 NOTE — Telephone Encounter (Signed)
CBC is normal.  GFR is low but is stable most likely due to the use of ACE inhibitor's.  Please forward labs to her PCP.

## 2020-08-03 ENCOUNTER — Telehealth: Payer: Self-pay | Admitting: Rheumatology

## 2020-08-03 NOTE — Telephone Encounter (Signed)
I called patient and discuss labs, lab results forwarded to PCP.

## 2020-08-03 NOTE — Telephone Encounter (Signed)
Patient left a message that she was returning a call to discuss lab results.

## 2020-08-10 ENCOUNTER — Telehealth: Payer: Self-pay | Admitting: Rheumatology

## 2020-08-10 NOTE — Telephone Encounter (Signed)
She can take tylenol sparingly for pain relief.  She should avoid taking NSAIDs due to elevated creatinine.

## 2020-08-10 NOTE — Telephone Encounter (Signed)
Patient called stating for the last 2 days she has been experiencing left shoulder pain.  Patient states the pain radiates down to her elbow.  Patient states her left hand is still, but not swollen.  Patient states she also had a low grade fever last night 99.8 which broke this morning.  Patient states she hasn't been able to lay on her left side for the past 2 nights.  Patient is requesting a return call to let her know if there is anything she can take.

## 2020-08-10 NOTE — Telephone Encounter (Signed)
When was the onset of her symptoms? Please clarify if she is having any shortness of breath, chest pain, diaphoresis, jaw pain, or nausea.  If so she should go to the ED for further evaluation.    She the discomfort is localized to her left shoulder joint please schedule a sooner office visit to obtain x-rays.

## 2020-08-10 NOTE — Telephone Encounter (Signed)
Patient states she is not having any shortness of breath, chest pain, diaphoresis, jaw pain, or nausea. Patient advised she can take tylenol sparingly for pain relief.  Patient advised she should avoid taking NSAIDs due to elevated creatinine. Patient states she is feeling better and will call back for a sooner appointment if it gets worse.

## 2020-08-10 NOTE — Telephone Encounter (Signed)
Patient states she is having pain in her left arm near her shoulder and into the top of her arm stopping at her elbow and it is interrupting her sleep. Patient states she tried Voltaren Gel and it has helped. Patient states she is doing better today. Patient states she did have a low grade fever at 99.8. Patient states she is having some stiffness in her left hand and her rings are tight. Patient does not recall any injury. Unable to lay on left side. Patient states she has not missed any doses of Arava. Patient would like to know what she can use as a pain reliever. Please advise.

## 2020-08-13 ENCOUNTER — Ambulatory Visit: Payer: Self-pay | Admitting: Rheumatology

## 2020-08-13 NOTE — Progress Notes (Signed)
Office Visit Note  Patient: Victoria Sharp             Date of Birth: 1941-03-01           MRN: [id number]             PCP: [name]., MD Referring: [name].,* Visit Date: 08/26/2020 Occupation: @GUAROCC @  Subjective:  Pain in joints.   History of Present Illness: Victoria Sharp is a 79 y.o. female with history of rheumatoid arthritis and osteoarthritis.  She was a started on Areva about 5 weeks ago for rheumatoid arthritis.  She states 3 weeks ago she developed a rash on her neck, upper arms, chest and between her thighs.  She was seen by PA at [facility] dermatology.  Who felt that it could be drug reaction.  She was given a steroid intramuscularly and was started on prednisone taper.  The rash is clearing up gradually.  She is also scheduled for a mammogram next week as she developed some swelling in her breast with the onset of the rash.  She has been taking leflunomide throughout this episode.  Its about 4 days ago she developed some swelling on her left wrist which lasted for about 2 days and then resolved.  None of the other joints are swollen currently.  Activities of Daily Living:  Patient reports morning stiffness for 0 minute.   Patient Denies nocturnal pain.  Difficulty dressing/grooming: Denies Difficulty climbing stairs: Denies Difficulty getting out of chair: Reports Difficulty using hands for taps, buttons, cutlery, and/or writing: Denies  Review of Systems  Constitutional: Negative for fatigue, night sweats, weight gain and weight loss.  HENT: Negative for mouth sores, trouble swallowing, trouble swallowing, mouth dryness and nose dryness.   Eyes: Positive for dryness. Negative for pain, redness and visual disturbance.  Respiratory: Negative for cough, shortness of breath and difficulty breathing.   Cardiovascular: Negative for chest pain, palpitations, hypertension, irregular heartbeat and swelling in legs/feet.    Gastrointestinal: Negative for blood in stool, constipation and diarrhea.  Endocrine: Negative for increased urination.  Genitourinary: Negative for vaginal dryness.  Musculoskeletal: Positive for arthralgias and joint pain. Negative for joint swelling, myalgias, muscle weakness, morning stiffness, muscle tenderness and myalgias.  Skin: Positive for rash. Negative for color change, hair loss, skin tightness, ulcers and sensitivity to sunlight.  Allergic/Immunologic: Negative for susceptible to infections.  Neurological: Negative for dizziness, memory loss, night sweats and weakness.  Hematological: Negative for swollen glands.  Psychiatric/Behavioral: Positive for sleep disturbance. Negative for depressed mood. The patient is not nervous/anxious.     PMFS History:  Patient Active Problem List   Diagnosis Date Noted  . Rheumatoid arthritis with rheumatoid factor of multiple sites without organ or systems involvement ([place]) 06/26/2020  . Essential hypertension 06/26/2020  . MVP (mitral valve prolapse) 06/26/2020  . History of Barrett's esophagus 06/26/2020  . History of gastroesophageal reflux (GERD) 06/26/2020  . History of breast cancer 06/26/2020  . High risk medication use 06/26/2020    Past Medical History:  Diagnosis Date  . Dermatitis   . History of breast cancer   . Hypertension   . Rheumatoid arthritis ([place])   . Tachycardia     Family History  Problem Relation Age of Onset  . Heart attack Father   . Sharp disease Brother   . Cancer Brother    Past Surgical History:  Procedure Laterality Date  . ABDOMINAL HYSTERECTOMY    . BREAST LUMPECTOMY Left  [place] Right 03/2020  . WEDGE RESECTION     Social History   Social History Narrative  . Not on file    There is no immunization history on file for this patient.   Objective: Vital Signs: BP (!) 172/107 (BP Location: Right Arm, Patient Position: Sitting, Cuff Size: Small)   Pulse 76   Resp 12    Ht 5\' 2"  (1.575 m)   Wt 145 lb 9.6 oz (66 kg)   BMI 26.63 kg/m    Physical Exam Vitals and nursing note reviewed.  Constitutional:      Appearance: She is well-developed.  HENT:     Head: Normocephalic and atraumatic.  Eyes:     Conjunctiva/sclera: Conjunctivae normal.  Cardiovascular:     Rate and Rhythm: Normal rate and regular rhythm.     Heart sounds: Normal heart sounds.  Pulmonary:     Effort: Pulmonary effort is normal.     Breath sounds: Normal breath sounds.  Abdominal:     General: Bowel sounds are normal.     Palpations: Abdomen is soft.  Musculoskeletal:     Cervical back: Normal range of motion.  Lymphadenopathy:     Cervical: No cervical adenopathy.  Skin:    General: Skin is warm and dry.     Capillary Refill: Capillary refill takes less than 2 seconds.  Neurological:     Mental Status: She is alert and oriented to person, place, and time.  Psychiatric:        Behavior: Behavior normal.      Musculoskeletal Exam: C-spine thoracic and lumbar spine with good range of motion.  Shoulder joints, elbow joints, wrist joints, MCPs, PIPs and DIPs with good range of motion with no synovitis.  Hip joints, knee joints, ankles, MTPs and PIPs with good range of motion with no synovitis.  CDAI Exam: CDAI Score: 0.8  Patient Global: 5 mm; Provider Global: 3 mm Swollen: 0 ; Tender: 0  Joint Exam 08/26/2020   No joint exam has been documented for this visit   There is currently no information documented on the homunculus. Go to the Rheumatology activity and complete the homunculus joint exam.  Investigation: No additional findings.  Imaging: No results found.  Recent Labs: Lab Results  Component Value Date   WBC 5.6 07/31/2020   HGB 14.8 07/31/2020   PLT 263 07/31/2020   NA 140 07/31/2020   K 4.6 07/31/2020   CL 104 07/31/2020   CO2 25 07/31/2020   GLUCOSE 87 07/31/2020   BUN 20 07/31/2020   CREATININE 1.04 (H) 07/31/2020   BILITOT 0.7 07/31/2020   AST  24 07/31/2020   ALT 18 07/31/2020   PROT 7.0 07/31/2020   CALCIUM 9.5 07/31/2020   GFRAA 60 07/31/2020   QFTBGOLDPLUS NEGATIVE 06/26/2020    Speciality Comments: No specialty comments available.  Procedures:  No procedures performed Allergies: Contrast media [iodinated diagnostic agents], Latex, Metronidazole, and Sulfamethoxazole-trimethoprim   Assessment / Plan:     Visit Diagnoses: Rheumatoid arthritis with rheumatoid factor of multiple sites without organ or systems involvement ([place]) - Positive RF, positive anti-CCP, positive ANA, positive synovitis.  Patient had no synovitis on my examination today.  As she is taking high-dose prednisone for the rash.  I have advised her to discontinue leflunomide.  The rash occurred after being on leflunomide for 2 weeks.  She was evaluated at [facility] dermatology and was given intramuscular steroids followed by prednisone taper.  She does not  have any synovitis on examination as she is on prednisone.  Different treatment options and their side effects were discussed at length.  After reviewing indications side effects contraindications we decided to proceed with Enbrel.  Handout was given and consent was taken.  We will apply for Enbrel.  We will schedule her an appointment for her first injection once approved.  Medication counseling:   TB Test: June 30, 2020 Hepatitis panel: June 30, 2020 HIV: June 30, 2020 SPEP: June 30, 2020 Immunoglobulins: June 30, 2020  Chest x-ray: June 27, 2020  Does patient have diagnosis of heart failure?  No  Counseled patient that Enbrel is a TNF blocking agent.  Reviewed Enbrel dose of 50 mg once weekly.  Counseled patient on purpose, proper use, and adverse effects of Enbrel.  Reviewed the most common adverse effects including infections, headache, and injection site reactions. Discussed that there is the possibility of an increased risk of malignancy but it is not well understood if this increased risk is due to the  medication or the disease state.  Advised patient to get yearly dermatology exams due to risk of skin cancer.  Reviewed the importance of regular labs while on Enbrel therapy.  Advised patient to get standing labs one month after starting Enbrel then every 2 months.  Provided patient with standing lab orders.  Counseled patient that Enbrel should be held prior to scheduled surgery.  Counseled patient to avoid live vaccines while on Enbrel.  Advised patient to get annual influenza vaccine and the pneumococcal vaccine as needed.  Provided patient with medication education material and answered all questions.  Patient voiced understanding.  Patient consented to Enbrel.  Will upload consent into the media tab.  Reviewed storage instructions for Enbrel.  Advised initial injection must be administered in office.  Patient voiced understanding.    High risk medication use - Arava 10mg  po qd. (previously on methotrexate at low dose but she had to stop it due to diarrhea)  - Plan: CBC with Differential/Platelet, COMPLETE METABOLIC PANEL WITH GFR  Primary osteoarthritis of both hands-she has some stiffness from underlying osteoarthritis.  Primary osteoarthritis of both feet-proper fitting shoes were discussed.  Positive ANA (antinuclear antibody) - she has no clinical features of lupus.  ENA negative.  Essential hypertension-her blood pressure is currently very high which most likely is due to use of prednisone.  I have advised her to monitor it closely and follow-up with her PCP.  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  Use of mask, social distancing and hand hygiene was discussed.  I have also advised her to get a COVID-19 booster after  she finishes the course of prednisone.  She is up-to-date on other vaccinations.  She will need flu vaccine this winter.  Orders: Orders Placed This Encounter  Procedures  . CBC with Differential/Platelet  . COMPLETE METABOLIC PANEL WITH GFR   No orders of the defined types were placed in this encounter.     Follow-Up Instructions: Return in about 6 weeks (around 10/07/2020) for Rheumatoid arthritis.   Bo Merino, MD  Note - This record has been created using Editor, commissioning.  Chart creation errors have been sought, but may not always  have been located. Such creation  errors do not reflect on  the standard of medical care.

## 2020-08-17 ENCOUNTER — Telehealth: Payer: Self-pay | Admitting: Rheumatology

## 2020-08-17 NOTE — Telephone Encounter (Signed)
Patient left a voicemail stating a couple of days ago she developed a rash from her neck, under her breast area and down to her waistline.  Patient states the left side is worse than the right.  Patient states she notices the rash is worse in the morning or when she is warm.  Patient states she has tried "itching lotion" which only helps temporarily.  Patient states she did start a new medication Leflunomide approximately 1 month ago.  Patient is requesting a return call.

## 2020-08-17 NOTE — Telephone Encounter (Signed)
I returned patient's call.  She states the rashes not getting worse it is the same.  She has noticed a lot of improvement on leflunomide.  I have advised her to schedule an appointment with her PCP and take their advice if it is a reaction to leflunomide or some other rash.  She can contact us if there is a concern about reaction to leflunomide and we can plan on changing her medication.

## 2020-08-21 ENCOUNTER — Telehealth: Payer: Self-pay | Admitting: Rheumatology

## 2020-08-21 NOTE — Telephone Encounter (Signed)
Ok to take dose pack while on Arava. Avoid taking NSAIDs while on prednisone taper.

## 2020-08-21 NOTE — Telephone Encounter (Signed)
Patient called stating she saw her PCP regarding a rash that began around her chest, under her left arm and down to her waist.  Patient states the rash was red, painful, and hard and it was diagnosed as dermatitis.  Patient states she was given a cortisone injection which made it better for a day.  Patient states the rash has returned and was given a prescription of Prednisone (48 tablet dose pack).  Patient states when she took the prescription to the pharmacy she was told "she shouldn't be taking Prednisone with Leflunomide" and advised to call her Rheumatologist before taking the medication.

## 2020-08-21 NOTE — Telephone Encounter (Signed)
Patient advised ok to take dose pack while on [place]. Patient advised to avoid taking NSAIDs while on prednisone taper.

## 2020-08-26 ENCOUNTER — Other Ambulatory Visit: Payer: Self-pay

## 2020-08-26 ENCOUNTER — Telehealth: Payer: Self-pay

## 2020-08-26 ENCOUNTER — Ambulatory Visit (INDEPENDENT_AMBULATORY_CARE_PROVIDER_SITE_OTHER): Payer: Medicare Other | Admitting: Rheumatology

## 2020-08-26 ENCOUNTER — Encounter: Payer: Self-pay | Admitting: Rheumatology

## 2020-08-26 VITALS — BP 172/107 | HR 76 | Resp 12 | Ht 62.0 in | Wt 145.6 lb

## 2020-08-26 DIAGNOSIS — Z853 Personal history of malignant neoplasm of breast: Secondary | ICD-10-CM

## 2020-08-26 DIAGNOSIS — M19071 Primary osteoarthritis, right ankle and foot: Secondary | ICD-10-CM

## 2020-08-26 DIAGNOSIS — Z8719 Personal history of other diseases of the digestive system: Secondary | ICD-10-CM

## 2020-08-26 DIAGNOSIS — E559 Vitamin D deficiency, unspecified: Secondary | ICD-10-CM

## 2020-08-26 DIAGNOSIS — M19072 Primary osteoarthritis, left ankle and foot: Secondary | ICD-10-CM

## 2020-08-26 DIAGNOSIS — I341 Nonrheumatic mitral (valve) prolapse: Secondary | ICD-10-CM

## 2020-08-26 DIAGNOSIS — Z79899 Other long term (current) drug therapy: Secondary | ICD-10-CM | POA: Diagnosis not present

## 2020-08-26 DIAGNOSIS — M0579 Rheumatoid arthritis with rheumatoid factor of multiple sites without organ or systems involvement: Secondary | ICD-10-CM | POA: Diagnosis not present

## 2020-08-26 DIAGNOSIS — M19041 Primary osteoarthritis, right hand: Secondary | ICD-10-CM | POA: Diagnosis not present

## 2020-08-26 DIAGNOSIS — M19042 Primary osteoarthritis, left hand: Secondary | ICD-10-CM

## 2020-08-26 DIAGNOSIS — I10 Essential (primary) hypertension: Secondary | ICD-10-CM

## 2020-08-26 DIAGNOSIS — R768 Other specified abnormal immunological findings in serum: Secondary | ICD-10-CM

## 2020-08-26 DIAGNOSIS — R21 Rash and other nonspecific skin eruption: Secondary | ICD-10-CM

## 2020-08-26 DIAGNOSIS — N183 Chronic kidney disease, stage 3 unspecified: Secondary | ICD-10-CM

## 2020-08-26 NOTE — Telephone Encounter (Signed)
Submitted a Prior Authorization request to [employer] for ENBREL via Cover My Meds. Will update once we receive a response.   (KeyDorian Heckle) - [id number]

## 2020-08-26 NOTE — Patient Instructions (Addendum)
Etanercept injection What is this medicine? ETANERCEPT (et a NER sept) is used for the treatment of rheumatoid arthritis in adults and children. The medicine is also used to treat psoriatic arthritis, ankylosing spondylitis, and psoriasis. This medicine may be used for other purposes; ask your health care provider or pharmacist if you have questions. COMMON BRAND NAME(S): Enbrel What should I tell my health care provider before I take this medicine? They need to know if you have any of these conditions:  blood disorders  cancer  congestive heart failure  diabetes  exposure to chickenpox  immune system problems  infection  multiple sclerosis  seizure disorder  tuberculosis, a positive skin test for tuberculosis or have recently been in close contact with someone who has tuberculosis  Wegener's granulomatosis  an unusual or allergic reaction to etanercept, latex, other medicines, foods, dyes, or preservatives  pregnant or trying to get pregnant  breast-feeding How should I use this medicine? The medicine is given by injection under the skin. You will be taught how to prepare and give this medicine. Use exactly as directed. Take your medicine at regular intervals. Do not take your medicine more often than directed. It is important that you put your used needles and syringes in a special sharps container. Do not put them in a trash can. If you do not have a sharps container, call your pharmacist or healthcare provider to get one. A special MedGuide will be given to you by the pharmacist with each prescription and refill. Be sure to read this information carefully each time. Talk to your pediatrician regarding the use of this medicine in children. While this drug may be prescribed for children as young as 4 years of age for selected conditions, precautions do apply. Overdosage: If you think you have taken too much of this medicine contact a poison control center or emergency room  at once. NOTE: This medicine is only for you. Do not share this medicine with others. What if I miss a dose? If you miss a dose, contact your health care professional to find out when you should take your next dose. Do not take double or extra doses without advice. What may interact with this medicine? Do not take this medicine with any of the following medications:  anakinra This medicine may also interact with the following medications:  cyclophosphamide  sulfasalazine  vaccines This list may not describe all possible interactions. Give your health care provider a list of all the medicines, herbs, non-prescription drugs, or dietary supplements you use. Also tell them if you smoke, drink alcohol, or use illegal drugs. Some items may interact with your medicine. What should I watch for while using this medicine? Tell your doctor or healthcare professional if your symptoms do not start to get better or if they get worse. You will be tested for tuberculosis (TB) before you start this medicine. If your doctor prescribes any medicine for TB, you should start taking the TB medicine before starting this medicine. Make sure to finish the full course of TB medicine. Call your doctor or health care professional for advice if you get a fever, chills or sore throat, or other symptoms of a cold or flu. Do not treat yourself. This drug decreases your body's ability to fight infections. Try to avoid being around people who are sick. What side effects may I notice from receiving this medicine? Side effects that you should report to your doctor or health care professional as soon as possible:  allergic   reactions like skin rash, itching or hives, swelling of the face, lips, or tongue  changes in vision  fever, chills or any other sign of infection  numbness or tingling in legs or other parts of the body  red, scaly patches or raised bumps on the skin  shortness of breath or difficulty  breathing  swollen lymph nodes in the neck, underarm, or groin areas  unexplained weight loss  unusual bleeding or bruising  unusual swelling or fluid retention in the legs  unusually weak or tired Side effects that usually do not require medical attention (report to your doctor or health care professional if they continue or are bothersome):  dizziness  headache  nausea  redness, itching, or swelling at the injection site  vomiting This list may not describe all possible side effects. Call your doctor for medical advice about side effects. You may report side effects to FDA at 1-800-FDA-1088. Where should I keep my medicine? Keep out of the reach of children. Enbrel products: Store unopened Enbrel vials, cartridges, or pens in a refrigerator between 2 and 8 degrees C (36 and 46 degrees F). Do not freeze. Do not shake. Keep in the original container to protect from light. Throw away any unopened and unused medicine that has been stored in the refrigerator after the expiration date. If needed, you may store an Enbrel single-use vial, single-dose prefilled syringe, SureClick autoinjector, or Enbrel Mini cartridge at room temperature between 20 to 25 degrees C (68 to 77 degrees F) for up to 14 days. Protect from light, heat, and moisture. Do not shake. Once any of these items are stored at room temperature, do not place them back into the refrigerator. Throw the item away after 14 days, even if it still contains medicine. If you are using the Enbrel multi-dose vials, you will be instructed on how to dilute and store this medicine once it has been diluted. The Enbrel AutoTouch reusable autoinjector device should be stored at room temperature. Do NOT refrigerate the AutoTouch reusable autoinjector. Erelzi products: Store Erelzi prefilled syringes or Sensoready pen in the refrigerator between 2 and 8 degrees C (36 and 46 degrees F). Do not freeze. Do not shake. Keep in the original container  to protect from light. Throw away any unopened and unused medicine that has been stored in the refrigerator after the expiration date. If needed, you may store the Erelzi prefilled syringe or pen at room temperature between 20 to 25 degrees C (68 to 77 degrees F) for up to 28 days. Protect from light, heat, and moisture. Do not shake. Once any of these items are stored at room temperature, do not place them back into the refrigerator. Throw the item away after 28 days, even if it still contains medicine. NOTE: This sheet is a summary. It may not cover all possible information. If you have questions about this medicine, talk to your doctor, pharmacist, or health care provider.  2020 Elsevier/Gold Standard (2019-03-11 09:49:58)  COVID-19 vaccine recommendations:   COVID-19 vaccine is recommended for everyone (unless you are allergic to a vaccine component), even if you are on a medication that suppresses your immune system.   If you are on Methotrexate, Cellcept (mycophenolate), Rinvoq, Xeljanz, and Olumiant- hold the medication for 1 week after each vaccine. Hold Methotrexate for 2 weeks after the single dose COVID-19 vaccine.   If you are on Orencia subcutaneous injection - hold medication one week prior to and one week after the first COVID-19 vaccine dose (  only).   If you are on Orencia IV infusions- time vaccination administration so that the first COVID-19 vaccination will occur four weeks after the infusion and postpone the subsequent infusion by one week.   If you are on Cyclophosphamide or Rituxan infusions please contact your doctor prior to receiving the COVID-19 vaccine.   Do not take Tylenol or any anti-inflammatory medications (NSAIDs) 24 hours prior to the COVID-19 vaccination.   There is no direct evidence about the efficacy of the COVID-19 vaccine in individuals who are on medications that suppress the immune system.   Even if you are fully vaccinated, and you are on any  medications that suppress your immune system, please continue to wear a mask, maintain at least six feet social distance and practice hand hygiene.   If you develop a COVID-19 infection, please contact your PCP or our office to determine if you need antibody infusion.  The booster vaccine is now available for immunocompromised patients. It is advised that if you had Pfizer vaccine you should get Pfizer booster.  If you had a Moderna vaccine then you should get a Moderna booster. Johnson and Johnson does not have a booster vaccine at this time.  Please see the following web sites for updated information.   [email or web address]://[email or web address]/[email or web address]-Vaccination-Patient-Resources.[email or web address]  [email or web address]://[email or web address]/About-[email or web address]-Releases/ID/1159   

## 2020-08-26 NOTE — Telephone Encounter (Signed)
Please apply for enbrel per Dr. Estanislado Pandy. Thanks!   Consent obtained and sent to the scan center. Also provided patient with patient assistance application (she will complete and bring back to the office). Packet started in pending PAP folder.

## 2020-08-26 NOTE — Telephone Encounter (Signed)
Received notification from [place] regarding a prior authorization for ENBREL. Authorization has been APPROVED from 07/27/20 to 11/24/20.   Authorization # [id number]   Ran test claim for 1 month supply, patient's copay is $1.742.83. Patient has patient assistance application.

## 2020-08-27 ENCOUNTER — Telehealth: Payer: Self-pay | Admitting: Rheumatology

## 2020-08-27 NOTE — Telephone Encounter (Signed)
Routing to clinical staff to advise.

## 2020-08-27 NOTE — Telephone Encounter (Signed)
Patient left a voicemail stating she has decided to bring the Enbrel paperwork to the office personally so that it can be reviewed to make sure everything is filled out correctly.  Patient is requesting a return call with the best time to stop by the office today.

## 2020-08-27 NOTE — Telephone Encounter (Signed)
Patient will come by on 08/28/2020 in the afternoon to discuss paperwork with Marissa per her request.

## 2020-09-09 NOTE — Telephone Encounter (Signed)
Patient returned signed application along with income documents.  Provider portion signed.  Application faxed.  Will update when we receive a response.   Mariella Saa, PharmD, [place], CPP Clinical Specialty Pharmacist (Rheumatology and Pulmonology)  09/09/2020 9:14 AM

## 2020-09-18 ENCOUNTER — Telehealth: Payer: Self-pay | Admitting: Rheumatology

## 2020-09-18 NOTE — Telephone Encounter (Signed)
FYI:  Patient called stating she had her COVID booster vaccine last Friday, 09/11/20.  Patient is asking if she can have her flu vaccine today when she takes her husband to his doctor's appointment.    Marissa checked with Dr. Estanislado Pandy who confirmed it was okay to have the flu vaccine since it has been one week since her booster.

## 2020-09-22 ENCOUNTER — Ambulatory Visit: Payer: Self-pay | Admitting: Rheumatology

## 2020-09-23 ENCOUNTER — Telehealth: Payer: Self-pay | Admitting: Rheumatology

## 2020-09-23 ENCOUNTER — Telehealth: Payer: Self-pay

## 2020-09-23 NOTE — Telephone Encounter (Signed)
Called [employer] and verified.  Received notification from [employer], Enbrel patient assistance application has been APPROVED. Coverage is from 09/11/20 to 12/25/20.   Phone# [phone] Fax# [phone]  Patient is ready to schedule Enbrel New start visit. She just needs to call [employer] to schedule shipment. Patient will need to reapply for 2022 calendar year.

## 2020-09-23 NOTE — Telephone Encounter (Signed)
Patient left a voicemail stating she received a letter last week that her Enbrel has been approved and requesting a return call to let her know what she needs to do next.

## 2020-09-23 NOTE — Telephone Encounter (Signed)
Patient calling to let you know #1. [employer] called to confirm delivery of Enbrel mini, and wanted to make sure it is suppose to be the 50mg . Delivery is scheduled for this Friday. Patient also request a call back this pm to confirm she is still okay to receive her first Enbrel dose tomorrow since she did receive the FLU vaccine this past Friday, 09/18/20.  Please call to advise patient this pm.

## 2020-09-23 NOTE — Telephone Encounter (Signed)
Routing to clinical team

## 2020-09-23 NOTE — Telephone Encounter (Signed)
Patient has been scheduled for 09/24/2020 at 10:30 am for a new start to Enbrel. Patient will call [employer] to set up shipment of medication. Patient provided with number.

## 2020-09-23 NOTE — Telephone Encounter (Signed)
Patient advised she is good to come for appointment tomorrow to start Enbrel.

## 2020-09-24 ENCOUNTER — Other Ambulatory Visit: Payer: Self-pay

## 2020-09-24 ENCOUNTER — Ambulatory Visit (INDEPENDENT_AMBULATORY_CARE_PROVIDER_SITE_OTHER): Payer: Medicare Other | Admitting: Pharmacist

## 2020-09-24 VITALS — BP 154/90 | HR 72

## 2020-09-24 DIAGNOSIS — M0579 Rheumatoid arthritis with rheumatoid factor of multiple sites without organ or systems involvement: Secondary | ICD-10-CM | POA: Diagnosis not present

## 2020-09-24 MED ORDER — ENBREL MINI 50 MG/ML ~~LOC~~ SOCT: 50.0000 mg | SUBCUTANEOUS | 0 refills | Status: DC

## 2020-09-24 NOTE — Progress Notes (Signed)
Pharmacy Note  Subjective:   Patient presents to clinic today to receive first dose of Enbrel.  Patient running a fever or have signs/symptoms of infection? No  Patient currently on antibiotics for the treatment of infection? No  Patient have any upcoming invasive procedures/surgeries? No  Objective: CMP     Component Value Date/Time   NA 140 07/31/2020 1033   K 4.6 07/31/2020 1033   CL 104 07/31/2020 1033   CO2 25 07/31/2020 1033   GLUCOSE 87 07/31/2020 1033   BUN 20 07/31/2020 1033   CREATININE 1.04 (H) 07/31/2020 1033   CALCIUM 9.5 07/31/2020 1033   PROT 7.0 07/31/2020 1033   AST 24 07/31/2020 1033   ALT 18 07/31/2020 1033   BILITOT 0.7 07/31/2020 1033   GFRNONAA 52 (L) 07/31/2020 1033   GFRAA 60 07/31/2020 1033    CBC    Component Value Date/Time   WBC 5.6 07/31/2020 1033   RBC 4.88 07/31/2020 1033   HGB 14.8 07/31/2020 1033   HCT 44.7 07/31/2020 1033   PLT 263 07/31/2020 1033   MCV 91.6 07/31/2020 1033   MCH 30.3 07/31/2020 1033   MCHC 33.1 07/31/2020 1033   RDW 13.4 07/31/2020 1033   LYMPHSABS 1,467 07/31/2020 1033   EOSABS 258 07/31/2020 1033   BASOSABS 62 07/31/2020 1033    Baseline Immunosuppressant Therapy Labs TB GOLD Quantiferon TB Gold Latest Ref Rng & Units 06/26/2020  Quantiferon TB Gold Plus NEGATIVE NEGATIVE   Hepatitis Panel Hepatitis Latest Ref Rng & Units 06/26/2020  Hep B Surface Ag NON-REACTI NON-REACTIVE  Hep B IgM NON-REACTI NON-REACTIVE   HIV Lab Results  Component Value Date   HIV NON-REACTIVE 06/26/2020   Immunoglobulins Immunoglobulin Electrophoresis Latest Ref Rng & Units 06/26/2020  IgA  70 - 320 mg/dL 283  IgG 600 - 1,540 mg/dL 882  IgM 50 - 300 mg/dL 40(L)   SPEP Serum Protein Electrophoresis Latest Ref Rng & Units 07/31/2020  Total Protein 6.1 - 8.1 g/dL 7.0  Albumin 3.8 - 4.8 g/dL -  Alpha-1 0.2 - 0.3 g/dL -  Alpha-2 0.5 - 0.9 g/dL -  Beta Globulin 0.4 - 0.6 g/dL -  Beta 2 0.2 - 0.5 g/dL -  Gamma Globulin 0.8 - 1.7  g/dL -   G6PD No results found for: G6PDH TPMT No results found for: TPMT   Chest x-ray: no acute disease 06/27/2020  Assessment/Plan:  Demonstrated proper injection technique with Enbrel demo device  Patient able to demonstrate proper injection technique using the teach back method. Patient self injected in the lower abdomen with:  Sample Medication: Enbrel Mini 50 mg/ml NDC: 56213-086-57 Lot: [id number] Expiration: 03/2022  Patient tolerated well.  Observed for 30 mins in office for adverse reaction and none noted.   Patient is to return in 1 month for labs and 6-8 weeks for follow-up appointment.  Standing orders placed.   Enbrel approved through insurance but unaffordable.  She was approved for patient assistance and will receive her first shipment tomorrow.  Patient blood pressure elevated at appointment.  Patient states she did not take her blood pressure medications this morning.  She was also anxious about giving herself an injection.  Her blood pressure came down after her injection.  Patient monitors her blood pressure at home.  Advised her to continue to monitor and follow-up with PCP if it continues to be elevated.  All questions encouraged and answered.  Instructed patient to call with any further questions or concerns.  Mariella Saa, PharmD, [employer]  Rheumatology Clinical Pharmacist  09/24/2020 10:40 AM

## 2020-09-24 NOTE — Patient Instructions (Signed)
Standing Labs We placed an order today for your standing lab work.   Please have your standing labs drawn in 1 month then every 3 months   If possible, please have your labs drawn 2 weeks prior to your appointment so that the provider can discuss your results at your appointment.  We have open lab daily Monday through Thursday from 8:30-12:30 PM and 1:30-4:30 PM and Friday from 8:30-12:30 PM and 1:30-4:00 PM at the office of Dr. Shaili Deveshwar, [facility].   Please be advised, patients with office appointments requiring lab work will take precedents over walk-in lab work.  If possible, please come for your lab work on Monday and Friday afternoons, as you may experience shorter wait times. The office is located at [address], [address], [place], [place] [address] No appointment is necessary.   Labs are drawn by [facility]. Please bring your co-pay at the time of your lab draw.  You may receive a bill from [facility] for your lab work.  If you wish to have your labs drawn at another location, please call the office 24 hours in advance to send orders.  If you have any questions regarding directions or hours of operation,  please call [phone].   As a reminder, please drink plenty of water prior to coming for your lab work. Thanks!   

## 2020-10-09 NOTE — Progress Notes (Signed)
Office Visit Note  Patient: Victoria Sharp             Date of Birth: 13-Jan-1941           MRN: [id number]             PCP: [name]., MD Referring: [name].,* Visit Date: 10/23/2020 Occupation: @GUAROCC @  Subjective:  Other (right sided low back pain, patient reports small injection site reaction when injecting enbrel. )   History of Present Illness: Victoria Sharp is a 80 y.o. female with history of seropositive rheumatoid arthritis.  She has been on Enbrel for about 5 weeks now.  Arava was discontinued due to dermatitis.  She states that she has been tolerating Enbrel well except for some localized injection site reaction.  She denies any joint pain or joint swelling.  She has some stiffness in the morning.  Recently she has been experiencing some pain on the right side of her lower back.  She denies any radiculopathy.  Activities of Daily Living:  Patient reports morning stiffness for 20 minutes.   Patient Denies nocturnal pain.  Difficulty dressing/grooming: Denies Difficulty climbing stairs: Denies Difficulty getting out of chair: Denies Difficulty using hands for taps, buttons, cutlery, and/or writing: Reports  Review of Systems  Constitutional: Negative for fatigue.  HENT: Negative for mouth sores, mouth dryness and nose dryness.   Eyes: Positive for dryness. Negative for pain and itching.  Respiratory: Negative for shortness of breath and difficulty breathing.   Cardiovascular: Negative for chest pain and palpitations.  Gastrointestinal: Negative for blood in stool, constipation and diarrhea.  Endocrine: Negative for increased urination.  Genitourinary: Negative for difficulty urinating.  Musculoskeletal: Positive for arthralgias, joint pain and morning stiffness. Negative for joint swelling, myalgias, muscle tenderness and myalgias.  Skin: Positive for rash. Negative for color change and redness.  Allergic/Immunologic: Negative for  susceptible to infections.  Neurological: Negative for dizziness, numbness, headaches, memory loss and weakness.  Hematological: Negative for bruising/bleeding tendency.  Psychiatric/Behavioral: Negative for confusion.    PMFS History:  Patient Active Problem List   Diagnosis Date Noted  . Rheumatoid arthritis with rheumatoid factor of multiple sites without organ or systems involvement ([place]) 06/26/2020  . Essential hypertension 06/26/2020  . MVP (mitral valve prolapse) 06/26/2020  . History of Barrett's esophagus 06/26/2020  . History of gastroesophageal reflux (GERD) 06/26/2020  . History of breast cancer 06/26/2020  . High risk medication use 06/26/2020    Past Medical History:  Diagnosis Date  . Dermatitis   . History of breast cancer   . Hypertension   . Rheumatoid arthritis ([place])   . Tachycardia     Family History  Problem Relation Age of Onset  . Heart attack Father   . Kidney disease Brother   . Cancer Brother    Past Surgical History:  Procedure Laterality Date  . ABDOMINAL HYSTERECTOMY    . BREAST LUMPECTOMY Left 1998  . NEUROMA SURGERY Right 03/2020  . WEDGE RESECTION     Social History   Social History Narrative  . Not on file   Immunization History  Administered Date(s) Administered  . PFIZER SARS-COV-2 Vaccination 01/09/2020, 01/30/2020, 09/11/2020     Objective: Vital Signs: BP 135/86 (BP Location: Left Arm, Patient Position: Sitting, Cuff Size: Normal)   Pulse 68   Resp 14   Ht 5\' 2"  (1.575 m)   Wt 144 lb 9.6 oz (65.6 kg)   BMI 26.45 kg/m    Physical  Exam Vitals and nursing note reviewed.  Constitutional:      Appearance: She is well-developed.  HENT:     Head: Normocephalic and atraumatic.  Eyes:     Conjunctiva/sclera: Conjunctivae normal.  Cardiovascular:     Rate and Rhythm: Normal rate and regular rhythm.     Heart sounds: Normal heart sounds.  Pulmonary:     Effort: Pulmonary effort is normal.     Breath sounds: Normal breath  sounds.  Abdominal:     General: Bowel sounds are normal.     Palpations: Abdomen is soft.  Musculoskeletal:     Cervical back: Normal range of motion.  Lymphadenopathy:     Cervical: No cervical adenopathy.  Skin:    General: Skin is warm and dry.     Capillary Refill: Capillary refill takes less than 2 seconds.  Neurological:     Mental Status: She is alert and oriented to person, place, and time.  Psychiatric:        Behavior: Behavior normal.      Musculoskeletal Exam: C-spine was in good range of motion.  Shoulder joints, elbow joints, wrist joints, MCPs PIPs and DIPs been good range of motion with no synovitis.  She has some PIP and DIP thickening due to underlying osteoarthritis.  She had discomfort in the right piriformis region.  Hip joints, knee joints were in good range of motion.  She had no tenderness over ankle joints or MTPs.  CDAI Exam: CDAI Score: 0.7  Patient Global: 5 mm; Provider Global: 2 mm Swollen: 0 ; Tender: 0  Joint Exam 10/23/2020   No joint exam has been documented for this visit   There is currently no information documented on the homunculus. Go to the Rheumatology activity and complete the homunculus joint exam.  Investigation: No additional findings.  Imaging: No results found.  Recent Labs: Lab Results  Component Value Date   WBC 5.6 10/19/2020   HGB 14.1 10/19/2020   PLT 223 10/19/2020   NA 140 10/19/2020   K 4.7 10/19/2020   CL 103 10/19/2020   CO2 29 10/19/2020   GLUCOSE 72 10/19/2020   BUN 23 10/19/2020   CREATININE 0.89 10/19/2020   BILITOT 0.7 10/19/2020   AST 22 10/19/2020   ALT 13 10/19/2020   PROT 6.5 10/19/2020   CALCIUM 9.8 10/19/2020   GFRAA 72 10/19/2020   QFTBGOLDPLUS NEGATIVE 06/26/2020    Speciality Comments: Methotrexate-diarrhea, Arava-dermatitis  Procedures:  No procedures performed Allergies: Contrast media [iodinated diagnostic agents], Latex, Metronidazole, and Sulfamethoxazole-trimethoprim    Assessment / Plan:     Visit Diagnoses: 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  She is doing much better on Enbrel 50 mg subcu weekly.  She had no synovitis on examination today.  She has been experiencing mild injection site reaction.  Instructions were given.  High risk medication use - Enbrel-started 09/24/20.Jolee Ewing 10mg  po qd- dcd due to dermatitis) (previously on methotrexate at low dose but she had to stop it due to diarrhea)  Primary osteoarthritis of both hands-she has mild DIP and PIP thickening.  Primary osteoarthritis of both feet-she has done very well after her right foot surgery and has been wearing normal shoes now.  Right piriformis syndrome-Tylenol, heating pad and stretching exercises were discussed.  I also gave her a handout on back exercises.  Positive ANA (antinuclear antibody)-she has no clinical features of lupus.  Essential hypertension-blood pressure is slightly elevated.  History of gastroesophageal reflux (GERD)  MVP (mitral valve prolapse)  History of Barrett's esophagus  Vitamin D deficiency  History of breast cancer  Educate about COVID-19 virus infection-patient is fully immunized against COVID-19.  She also received a booster.  Use of mask, social distancing and hand hygiene was discussed.  Use of monoclonal antibodies were discussed in case she develops COVID-19 infection.  Orders: No orders of the defined types were placed in this encounter.  No orders of the defined types were placed in this encounter.   Follow-Up Instructions: Return in about 3 months (around 01/23/2021) for Rheumatoid arthritis.   Bo Merino, MD  Note - This record has been created using Editor, commissioning.  Chart creation errors have been sought, but may not always  have been located. Such creation errors do not reflect on  the  standard of medical care.

## 2020-10-12 ENCOUNTER — Ambulatory Visit: Payer: Self-pay | Admitting: Rheumatology

## 2020-10-14 ENCOUNTER — Ambulatory Visit: Payer: Medicare Other | Admitting: Rheumatology

## 2020-10-16 ENCOUNTER — Telehealth: Payer: Self-pay

## 2020-10-16 DIAGNOSIS — Z79899 Other long term (current) drug therapy: Secondary | ICD-10-CM

## 2020-10-16 NOTE — Telephone Encounter (Signed)
Lab Orders released.  

## 2020-10-16 NOTE — Telephone Encounter (Signed)
Patient called requesting her labwork orders be sent to [facility] in [place].  Patient states she will be going on Monday, 10/19/20.

## 2020-10-19 LAB — CBC WITH DIFFERENTIAL/PLATELET
Absolute Monocytes: 616 cells/uL (ref 200–950)
Basophils Absolute: 39 cells/uL (ref 0–200)
Basophils Relative: 0.7 %
Eosinophils Absolute: 190 cells/uL (ref 15–500)
Eosinophils Relative: 3.4 %
HCT: 42.4 % (ref 35.0–45.0)
Hemoglobin: 14.1 g/dL (ref 11.7–15.5)
Lymphs Abs: 1579 cells/uL (ref 850–3900)
MCH: 30.9 pg (ref 27.0–33.0)
MCHC: 33.3 g/dL (ref 32.0–36.0)
MCV: 92.8 fL (ref 80.0–100.0)
MPV: 12.1 fL (ref 7.5–12.5)
Monocytes Relative: 11 %
Neutro Abs: 3175 cells/uL (ref 1500–7800)
Neutrophils Relative %: 56.7 %
Platelets: 223 10*3/uL (ref 140–400)
RBC: 4.57 10*6/uL (ref 3.80–5.10)
RDW: 12.7 % (ref 11.0–15.0)
Total Lymphocyte: 28.2 %
WBC: 5.6 10*3/uL (ref 3.8–10.8)

## 2020-10-19 LAB — COMPLETE METABOLIC PANEL WITH GFR
AG Ratio: 1.6 (calc) (ref 1.0–2.5)
ALT: 13 U/L (ref 6–29)
AST: 22 U/L (ref 10–35)
Albumin: 4 g/dL (ref 3.6–5.1)
Alkaline phosphatase (APISO): 62 U/L (ref 37–153)
BUN: 23 mg/dL (ref 7–25)
CO2: 29 mmol/L (ref 20–32)
Calcium: 9.8 mg/dL (ref 8.6–10.4)
Chloride: 103 mmol/L (ref 98–110)
Creat: 0.89 mg/dL (ref 0.60–0.93)
GFR, Est African American: 72 mL/min/{1.73_m2} (ref >=60)
GFR, Est Non African American: 63 mL/min/{1.73_m2} (ref >=60)
Globulin: 2.5 g/dL (calc) (ref 1.9–3.7)
Glucose, Bld: 72 mg/dL (ref 65–139)
Potassium: 4.7 mmol/L (ref 3.5–5.3)
Sodium: 140 mmol/L (ref 135–146)
Total Bilirubin: 0.7 mg/dL (ref 0.2–1.2)
Total Protein: 6.5 g/dL (ref 6.1–8.1)

## 2020-10-20 NOTE — Telephone Encounter (Signed)
CBC and CMP normal

## 2020-10-23 ENCOUNTER — Ambulatory Visit (INDEPENDENT_AMBULATORY_CARE_PROVIDER_SITE_OTHER): Payer: Medicare Other | Admitting: Rheumatology

## 2020-10-23 ENCOUNTER — Encounter: Payer: Self-pay | Admitting: Rheumatology

## 2020-10-23 ENCOUNTER — Other Ambulatory Visit: Payer: Self-pay

## 2020-10-23 VITALS — BP 135/86 | HR 68 | Resp 14 | Ht 62.0 in | Wt 144.6 lb

## 2020-10-23 DIAGNOSIS — G5701 Lesion of sciatic nerve, right lower limb: Secondary | ICD-10-CM

## 2020-10-23 DIAGNOSIS — N183 Chronic kidney disease, stage 3 unspecified: Secondary | ICD-10-CM

## 2020-10-23 DIAGNOSIS — I341 Nonrheumatic mitral (valve) prolapse: Secondary | ICD-10-CM

## 2020-10-23 DIAGNOSIS — M19041 Primary osteoarthritis, right hand: Secondary | ICD-10-CM | POA: Diagnosis not present

## 2020-10-23 DIAGNOSIS — M19072 Primary osteoarthritis, left ankle and foot: Secondary | ICD-10-CM

## 2020-10-23 DIAGNOSIS — I10 Essential (primary) hypertension: Secondary | ICD-10-CM

## 2020-10-23 DIAGNOSIS — M0579 Rheumatoid arthritis with rheumatoid factor of multiple sites without organ or systems involvement: Secondary | ICD-10-CM

## 2020-10-23 DIAGNOSIS — M19071 Primary osteoarthritis, right ankle and foot: Secondary | ICD-10-CM | POA: Diagnosis not present

## 2020-10-23 DIAGNOSIS — R768 Other specified abnormal immunological findings in serum: Secondary | ICD-10-CM

## 2020-10-23 DIAGNOSIS — Z7189 Other specified counseling: Secondary | ICD-10-CM

## 2020-10-23 DIAGNOSIS — E559 Vitamin D deficiency, unspecified: Secondary | ICD-10-CM

## 2020-10-23 DIAGNOSIS — Z853 Personal history of malignant neoplasm of breast: Secondary | ICD-10-CM

## 2020-10-23 DIAGNOSIS — Z79899 Other long term (current) drug therapy: Secondary | ICD-10-CM | POA: Diagnosis not present

## 2020-10-23 DIAGNOSIS — M19042 Primary osteoarthritis, left hand: Secondary | ICD-10-CM

## 2020-10-23 DIAGNOSIS — Z8719 Personal history of other diseases of the digestive system: Secondary | ICD-10-CM

## 2020-10-23 NOTE — Patient Instructions (Addendum)
Standing Labs We placed an order today for your standing lab work.   Please have your standing labs drawn in January and every 3 months  If possible, please have your labs drawn 2 weeks prior to your appointment so that the provider can discuss your results at your appointment.  We have open lab daily Monday through Thursday from 8:30-12:30 PM and 1:30-4:30 PM and Friday from 8:30-12:30 PM and 1:30-4:00 PM at the office of Dr. Bo Merino, [facility].   Please be advised, patients with office appointments requiring lab work will take precedents over walk-in lab work.  If possible, please come for your lab work on Monday and Friday afternoons, as you may experience shorter wait times. The office is located at [address] [place], [place], [place] [address] No appointment is necessary.   Labs are drawn by [facility]. Please bring your co-pay at the time of your lab draw.  You may receive a bill from [facility] for your lab work.  If you wish to have your labs drawn at another location, please call the office 24 hours in advance to send orders.  If you have any questions regarding directions or hours of operation,  please call [phone].   As a reminder, please drink plenty of water prior to coming for your lab work. Thanks!  COVID-19 vaccine recommendations:   COVID-19 vaccine is recommended for everyone (unless you are allergic to a vaccine component), even if you are on a medication that suppresses your immune system.    Do not take Tylenol or any anti-inflammatory medications (NSAIDs) 24 hours prior to the COVID-19 vaccination.   There is no direct evidence about the efficacy of the COVID-19 vaccine in individuals who are on medications that suppress the immune system.   Even if you are fully vaccinated, and you are on any medications that suppress your immune system, please continue to wear a mask, maintain at least six feet social distance and practice hand  hygiene.   If you develop a COVID-19 infection, please contact your PCP or our office to determine if you need monoclonal antibody infusion.  The booster vaccine is now available for immunocompromised patients.   Please see the following web sites for updated information.   [email or web address]://[email or web address]/[email or web address]-[email or web address]-Patient-Resources.[email or web address]    Back Exercises The following exercises strengthen the muscles that help to support the trunk and back. They also help to keep the lower back flexible. Doing these exercises can help to prevent back pain or lessen existing pain.  If you have back pain or discomfort, try doing these exercises 2-3 times each day or as told by your health care provider.  As your pain improves, do them once each day, but increase the number of times that you repeat the steps for each exercise (do more repetitions).  To prevent the recurrence of back pain, continue to do these exercises once each day or as told by your health care provider. Do exercises exactly as told by your health care provider and adjust them as directed. It is normal to feel mild stretching, pulling, tightness, or discomfort as you do these exercises, but you should stop right away if you feel sudden pain or your pain gets worse. Exercises Single knee to chest Repeat these steps 3-5 times for each leg: 1. Lie on your back on a firm bed or the floor with your legs extended. 2. Bring one knee to your chest. Your other leg should stay extended and in contact with the floor. 3.  Hold your knee in place by grabbing your knee or thigh with both hands and hold. 4. Pull on your knee until you feel a gentle stretch in your lower back or buttocks. 5. Hold the stretch for 10-30 seconds. 6. Slowly release and straighten your leg. Pelvic tilt Repeat these steps 5-10 times: 1. Lie on your back on a firm bed or the floor with your legs extended. 2. Bend your knees so they are pointing toward  the ceiling and your feet are flat on the floor. 3. Tighten your lower abdominal muscles to press your lower back against the floor. This motion will tilt your pelvis so your tailbone points up toward the ceiling instead of pointing to your feet or the floor. 4. With gentle tension and even breathing, hold this position for 5-10 seconds. Cat-cow Repeat these steps until your lower back becomes more flexible: 1. Get into a hands-and-knees position on a firm surface. Keep your hands under your shoulders, and keep your knees under your hips. You may place padding under your knees for comfort. 2. Let your head hang down toward your chest. Contract your abdominal muscles and point your tailbone toward the floor so your lower back becomes rounded like the back of a cat. 3. Hold this position for 5 seconds. 4. Slowly lift your head, let your abdominal muscles relax and point your tailbone up toward the ceiling so your back forms a sagging arch like the back of a cow. 5. Hold this position for 5 seconds.  Press-ups Repeat these steps 5-10 times: 1. Lie on your abdomen (face-down) on the floor. 2. Place your palms near your head, about shoulder-width apart. 3. Keeping your back as relaxed as possible and keeping your hips on the floor, slowly straighten your arms to raise the top half of your body and lift your shoulders. Do not use your back muscles to raise your upper torso. You may adjust the placement of your hands to make yourself more comfortable. 4. Hold this position for 5 seconds while you keep your back relaxed. 5. Slowly return to lying flat on the floor.  Bridges Repeat these steps 10 times: 1. Lie on your back on a firm surface. 2. Bend your knees so they are pointing toward the ceiling and your feet are flat on the floor. Your arms should be flat at your sides, next to your body. 3. Tighten your buttocks muscles and lift your buttocks off the floor until your waist is at almost the same  height as your knees. You should feel the muscles working in your buttocks and the back of your thighs. If you do not feel these muscles, slide your feet 1-2 inches farther away from your buttocks. 4. Hold this position for 3-5 seconds. 5. Slowly lower your hips to the starting position, and allow your buttocks muscles to relax completely. If this exercise is too easy, try doing it with your arms crossed over your chest. Abdominal crunches Repeat these steps 5-10 times: 1. Lie on your back on a firm bed or the floor with your legs extended. 2. Bend your knees so they are pointing toward the ceiling and your feet are flat on the floor. 3. Cross your arms over your chest. 4. Tip your chin slightly toward your chest without bending your neck. 5. Tighten your abdominal muscles and slowly raise your trunk (torso) high enough to lift your shoulder blades a tiny bit off the floor. Avoid raising your torso higher than that because  it can put too much stress on your low back and does not help to strengthen your abdominal muscles. 6. Slowly return to your starting position. Back lifts Repeat these steps 5-10 times: 1. Lie on your abdomen (face-down) with your arms at your sides, and rest your forehead on the floor. 2. Tighten the muscles in your legs and your buttocks. 3. Slowly lift your chest off the floor while you keep your hips pressed to the floor. Keep the back of your head in line with the curve in your back. Your eyes should be looking at the floor. 4. Hold this position for 3-5 seconds. 5. Slowly return to your starting position. Contact a health care provider if:  Your back pain or discomfort gets much worse when you do an exercise.  Your worsening back pain or discomfort does not lessen within 2 hours after you exercise. If you have any of these problems, stop doing these exercises right away. Do not do them again unless your health care provider says that you can. Get help right away  if:  You develop sudden, severe back pain. If this happens, stop doing the exercises right away. Do not do them again unless your health care provider says that you can. This information is not intended to replace advice given to you by your health care provider. Make sure you discuss any questions you have with your health care provider. Document Revised: 04/18/2019 Document Reviewed: 09/13/2018 Elsevier Patient Education  [employer].

## 2020-10-26 ENCOUNTER — Telehealth: Payer: Self-pay

## 2020-10-26 NOTE — Telephone Encounter (Signed)
Patient left a voicemail stating at her appointment in September she had discussed taking care of her grandchildren three afternoons per week and Dr. Estanislado Pandy wasn't exactly satisfied with her doing that.  Patient states she forgot to ask at her appointment last week "since she had such a good report if it would be okay now to watch them."  Patient requested a return call.

## 2020-10-27 ENCOUNTER — Telehealth: Payer: Self-pay | Admitting: Pharmacy Technician

## 2020-10-27 NOTE — Telephone Encounter (Signed)
Patient advised per Dr. Estanislado Pandy,  she is fully immunized.  She may take care of her grandchildren using all the precautions including use of mask, and good handwashing. Patient expressed understanding.

## 2020-10-27 NOTE — Telephone Encounter (Signed)
Received notification from [employer] regarding a prior authorization for ENBREL. Authorization has been APPROVED from 09/26/20 to 10/26/23.   Authorization # [id number]

## 2020-10-27 NOTE — Telephone Encounter (Signed)
I reviewed her chart.  She is fully immunized.  She may take care of her grandchildren using all the precautions including use of mask, and good handwashing.

## 2020-11-05 ENCOUNTER — Telehealth: Payer: Self-pay | Admitting: Rheumatology

## 2020-11-05 NOTE — Telephone Encounter (Signed)
Patient received patient assistance form for Enbrel. Patient states nothing has changed since she started it two months ago. Please call to advise patient.

## 2020-11-05 NOTE — Telephone Encounter (Signed)
Returned call and advised patient that she will need ot reapply for 2022. She will complete form and drop off at office.

## 2020-11-09 ENCOUNTER — Telehealth: Payer: Self-pay | Admitting: Pharmacist

## 2020-11-09 NOTE — Telephone Encounter (Signed)
Patient signed and completedEnbrel Mini renewal application(maintenance dose of Enbrel 50mg  once weekly).Faxed applicationwithprescription to [employer].  Faxed to [facility] at [place] phone: [phone]  Knox Saliva, PharmD, MPH Clinical Pharmacist (Rheumatology and Pulmonology)

## 2020-11-18 ENCOUNTER — Telehealth: Payer: Self-pay | Admitting: Rheumatology

## 2020-11-18 NOTE — Telephone Encounter (Signed)
Patient left a message stating she has either a really bad allergy, or cold. Patient wants to know if she should hold off on taking Enbrel tomorrow? Please call to advise.

## 2020-11-18 NOTE — Telephone Encounter (Signed)
Patient advised to hold her Enbrel. Patient advised if her symptoms continue then she should be evaluated by PCP or urgent care. Patient advised if placed on antibiotics she should hold Enbrel until they are completed and she is well. Patient expressed understanding.

## 2020-12-01 ENCOUNTER — Telehealth: Payer: Self-pay

## 2020-12-01 NOTE — Telephone Encounter (Signed)
Patient left a voicemail stating she will finish her antibiotics on Wednesday, 12/02/20 and is requesting a return call to let her know when she can begin taking her Enbrel injection again.

## 2020-12-01 NOTE — Telephone Encounter (Signed)
Patient advised she needs to complete the antibiotic and feeling well prior to restarting Enbrel. Patient advised if she is still not feeling well after completing the antibiotics she will need to be reevaluated by her PCP and not to resume the Enbrel.  Patient expressed understanding.

## 2020-12-07 ENCOUNTER — Telehealth: Payer: Self-pay

## 2020-12-07 NOTE — Telephone Encounter (Signed)
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  Patient requested a return call.

## 2020-12-07 NOTE — Telephone Encounter (Signed)
Returned patient's call and advised that office has not received determination and that [employer] is currently processing a high volume of applications. Will advise when determination has been received.

## 2020-12-17 ENCOUNTER — Telehealth: Payer: Self-pay | Admitting: Pharmacist

## 2020-12-17 NOTE — Telephone Encounter (Signed)
Okay thank you

## 2020-12-17 NOTE — Telephone Encounter (Addendum)
Patient's auto-injector AutoTouch malfunctioned - she just received the pen in September and the battery shouldn't have expired after just 3 months. She will be receiving a new one from the company is 7-10 days. She did not receive any dose today since AutoTouch malfunctioned. She plans to stop by clinic today, 12/17/20, to pick up new AutoTouch device and confirmed she has some doses at home.  Advised that once she runs out of her doses at home and if she hasn't heard back from [facility] at that point in time, we can potentially provide a sample then.  Patient's PAP renewal application is still pending, and she plans to call [employer] for an update this week.  Labeled auto-injector is on my desk.  Medication Samples have been provided to the patient.  Drug name: Enbrel AutoTouch Autoinjector Qty: 1 NDC: [id number] LOT: [id number] Exp.Date: 03/25/22   Dosing instructions: Use to inject Enbrel once weekly.  The patient has been instructed regarding the correct time, dose, and frequency of taking this medication, including desired effects and most common side effects.   Cassandria Anger 9:41 AM 12/17/2020  Knox Saliva, PharmD, MPH Clinical Pharmacist (Rheumatology and Pulmonology)

## 2020-12-21 ENCOUNTER — Telehealth: Payer: Self-pay

## 2020-12-21 NOTE — Telephone Encounter (Signed)
Called [employer] and have already provided verbal to replace AutoTouch injector. Patient came into clinic last week b/c it malfunctioned after 3 months of use.  Patient will be notified by [employer] if device needs to be returned to company for further inspection. Patient aware that company will take 7-10 days to ship AutoTouch to patient.  Nothing further needed.  Chesley Mires, PharmD, MPH Clinical Pharmacist (Rheumatology and Pulmonology)

## 2020-12-21 NOTE — Telephone Encounter (Signed)
Nikki from [employer] left a voicemail for a verbal order for a product replacement for the patient.  Patient has reported her Enbrel mini as damaged or defective to the manufacturer.   [employer] will replace the product free of charge and they can ship it to her.  We do need a prescription in order to dispense.  Please call [phone]  Case #[id number]

## 2021-01-08 NOTE — Progress Notes (Signed)
Office Visit Note  Patient: Victoria Sharp             Date of Birth: [birth year]           MRN: [id number]             PCP: [name]., MD Referring: [name].,* Visit Date: 01/22/2021 Occupation: @GUAROCC @  Subjective:  Low back pain   History of Present Illness: Victoria Sharp is a 80 y.o. female with history of seropositive rheumatoid arthritis and osteoarthritis.  She was started on Enbrel 50 mg sq injections once weekly in September 2021.  She has no significant clinical improvement since starting on Enbrel.  She has not had any recent flares.  The swelling and pain in her right hand has improved significantly.  Patient reports that she recently was diagnosed with a sinus infection and was treated with a course of antibiotics.  She states that she held Enbrel during this time.  She did not notice any increased joint pain or joint swelling while off of Enbrel.  Patient reports that she continues to have persistent right-sided lower back pain.  She has been performing home exercises at home but her symptoms have not improved.  She was evaluated by her PCP who recommended physical therapy.  She will be starting physical therapy next week.     Activities of Daily Living:  Patient reports morning stiffness for 0 minutes.   Patient Denies nocturnal pain.  Difficulty dressing/grooming: Denies Difficulty climbing stairs: Denies Difficulty getting out of chair: Denies Difficulty using hands for taps, buttons, cutlery, and/or writing: Denies  Review of Systems  Constitutional: Negative for fatigue.  HENT: Negative for mouth sores, mouth dryness and nose dryness.   Eyes: Positive for dryness. Negative for pain and visual disturbance.  Respiratory: Negative for cough, hemoptysis, shortness of breath and difficulty breathing.   Cardiovascular: Negative for chest pain, palpitations, hypertension and swelling in legs/feet.  Gastrointestinal: Negative for blood  in stool, constipation and diarrhea.  Endocrine: Negative for increased urination.  Genitourinary: Negative for painful urination.  Musculoskeletal: Positive for arthralgias, joint pain and muscle weakness. Negative for joint swelling, myalgias, morning stiffness, muscle tenderness and myalgias.  Skin: Negative for color change, pallor, rash, hair loss, nodules/bumps, skin tightness, ulcers and sensitivity to sunlight.  Allergic/Immunologic: Negative for susceptible to infections.  Neurological: Negative for dizziness, numbness, headaches and weakness.  Hematological: Negative for swollen glands.  Psychiatric/Behavioral: Negative for depressed mood and sleep disturbance. The patient is not nervous/anxious.     PMFS History:  Patient Active Problem List   Diagnosis Date Noted  . Rheumatoid arthritis with rheumatoid factor of multiple sites without organ or systems involvement ([place]) 06/26/2020  . Essential hypertension 06/26/2020  . MVP (mitral valve prolapse) 06/26/2020  . History of Barrett's esophagus 06/26/2020  . History of gastroesophageal reflux (GERD) 06/26/2020  . History of breast cancer 06/26/2020  . High risk medication use 06/26/2020    Past Medical History:  Diagnosis Date  . Dermatitis   . History of breast cancer   . Hypertension   . Rheumatoid arthritis ([place])   . Tachycardia     Family History  Problem Relation Age of Onset  . Heart attack Father   . Kidney disease Brother   . Cancer Brother    Past Surgical History:  Procedure Laterality Date  . ABDOMINAL HYSTERECTOMY    . BREAST LUMPECTOMY Left 1998  . NEUROMA SURGERY Right 03/2020  . WEDGE RESECTION  Social History   Social History Narrative  . Not on file   Immunization History  Administered Date(s) Administered  . PFIZER(Purple Top)SARS-COV-2 Vaccination 01/09/2020, 01/30/2020, 09/11/2020     Objective: Vital Signs: BP (!) 154/99 (BP Location: Left Arm, Patient Position: Sitting, Cuff Size:  Normal)   Pulse 70   Resp 13   Ht 5\' 2"  (1.575 m)   Wt 152 lb 6.4 oz (69.1 kg)   BMI 27.87 kg/m    Physical Exam Vitals and nursing note reviewed.  Constitutional:      Appearance: She is well-developed and well-nourished.  HENT:     Head: Normocephalic and atraumatic.  Eyes:     Extraocular Movements: EOM normal.     Conjunctiva/sclera: Conjunctivae normal.  Cardiovascular:     Pulses: Intact distal pulses.  Pulmonary:     Effort: Pulmonary effort is normal.  Abdominal:     Palpations: Abdomen is soft.  Musculoskeletal:     Cervical back: Normal range of motion.  Skin:    General: Skin is warm and dry.     Capillary Refill: Capillary refill takes less than 2 seconds.  Neurological:     Mental Status: She is alert and oriented to person, place, and time.  Psychiatric:        Mood and Affect: Mood and affect normal.        Behavior: Behavior normal.      Musculoskeletal Exam: C-spine has good range of motion with no discomfort.  Some discomfort in the lumbar spine with range of motion.  Tenderness in the lumbar region and over the right piriformis muscle.  Shoulder joints, elbow joints, wrist joints, MCPs, PIPs, DIPs have good range of motion with no synovitis.  She is able to make a complete fist bilaterally.  PIP and DIP thickening consistent with osteoarthritis of both hands noted.  Hip joints have good range of motion with no discomfort.  Knee joints have good range of motion with no warmth or effusion.  Ankle joints have good range of motion with no tenderness or inflammation.  CDAI Exam: CDAI Score: 0.2  Patient Global: 1 mm; Provider Global: 1 mm Swollen: 0 ; Tender: 0  Joint Exam 01/22/2021   No joint exam has been documented for this visit   There is currently no information documented on the homunculus. Go to the Rheumatology activity and complete the homunculus joint exam.  Investigation: No additional findings.  Imaging: No results found.  Recent  Labs: Lab Results  Component Value Date   WBC 8.2 01/14/2021   HGB 14.7 01/14/2021   PLT 251 01/14/2021   NA 139 01/14/2021   K 4.0 01/14/2021   CL 103 01/14/2021   CO2 28 01/14/2021   GLUCOSE 81 01/14/2021   BUN 20 01/14/2021   CREATININE 0.95 (H) 01/14/2021   BILITOT 0.6 01/14/2021   AST 22 01/14/2021   ALT 13 01/14/2021   PROT 6.7 01/14/2021   CALCIUM 9.7 01/14/2021   GFRAA 67 01/14/2021   QFTBGOLDPLUS NEGATIVE 06/26/2020    Speciality Comments: Methotrexate-diarrhea, Arava-dermatitis  Procedures:  No procedures performed Allergies: Contrast media [iodinated diagnostic agents], Latex, Leflunomide, Methotrexate derivatives, Metronidazole, and Sulfamethoxazole-trimethoprim   Assessment / Plan:     Visit Diagnoses: Rheumatoid arthritis with rheumatoid factor of multiple sites without organ or systems involvement ([place]) - Positive RF, positive anti-CCP, positive ANA, positive synovitis: She has no joint tenderness or synovitis on exam.  She has not had any recent rheumatoid arthritis flares.  She has  clinically been doing well on Enbrel 50 mg sq injections once weekly.  She was started on Enbrel on 09/24/2020 and has been tolerating it without any side effects or injection site reactions.  She has PIP and DIP thickening consistent with osteoarthritis of both hands but has no tenderness or synovitis of MCP joints on exam.  She has noticed significant clinical improvement on Enbrel.  She will continue on Enbrel 50 mg sq injections once weekly.  She does not need a refill at this time.  She was advised to notify us if she develops increased joint pain or joint swelling.  She will follow up in the office in 5 months.   High risk medication use - Enbrel 50 mg sq injections once weekly-started 09/24/20.    Jolee Ewing 10mg  po qd- dcd due to dermatitis) (previously on methotrexate at low dose but she had to stop it due to diarrhea).  CBC and CMP were updated on 01/14/21.  She will be due to update lab  work in April and every 3 months to monitor for drug toxicity. Standing orders for CBC and CMP are in place. TB gold negative on 06/26/20 and will continue to be monitored yearly.  She recently held Enbrel due to being diagnosed with a sinus infection.  She has since resumed the use of Enbrel.  We discussed the importance of holding Enbrel if she develops signs or symptoms of infection and to resume once infection has completely cleared. She has received 3 pfizer covid-19 vaccine doses.  Discussed the importance of yearly skin exams while on Enbrel due to the increased risk of skin cancer.  Primary osteoarthritis of both hands: She has PIP and DIP thickening consistent with osteoarthritis of both hands.  No joint tenderness or inflammation was noted.  We discussed the importance of joint protection and muscle strengthening.  Primary osteoarthritis of both feet: She is not experiencing any discomfort in her feet at this time.  Piriformis syndrome of right side: She has tenderness location on examination today.  She has been performing home exercises without significant relief.  She will be starting physical therapy next week.   Positive ANA (antinuclear antibody) - She has no clinical features of systemic lupus.  Osteoporosis screening:Future order for DEXA placed today.   Other medical conditions are listed as follows:  History of gastroesophageal reflux (GERD)  Essential hypertension  History of Barrett's esophagus  MVP (mitral valve prolapse)  Vitamin D deficiency  History of breast cancer  Orders: Orders Placed This Encounter  Procedures  . DG BONE DENSITY (DXA)   No orders of the defined types were placed in this encounter.    Follow-Up Instructions: Return in about 5 months (around 06/22/2021) for Rheumatoid arthritis, Osteoarthritis.   Ofilia Neas, PA-C  Note - This record has been created using Dragon software.  Chart creation errors have been sought, but may not  always  have been located. Such creation errors do not reflect on  the standard of medical care.

## 2021-01-14 ENCOUNTER — Telehealth: Payer: Self-pay

## 2021-01-14 ENCOUNTER — Other Ambulatory Visit: Payer: Self-pay | Admitting: *Deleted

## 2021-01-14 DIAGNOSIS — M0579 Rheumatoid arthritis with rheumatoid factor of multiple sites without organ or systems involvement: Secondary | ICD-10-CM

## 2021-01-14 DIAGNOSIS — Z111 Encounter for screening for respiratory tuberculosis: Secondary | ICD-10-CM

## 2021-01-14 DIAGNOSIS — Z9225 Personal history of immunosupression therapy: Secondary | ICD-10-CM

## 2021-01-14 DIAGNOSIS — Z79899 Other long term (current) drug therapy: Secondary | ICD-10-CM

## 2021-01-14 NOTE — Telephone Encounter (Signed)
Patient called requesting her labwork orders be sent to [facility] [address] [facility] in [place].  Patient states she will be going this morning (01/14/21) at 10:00 am.

## 2021-01-14 NOTE — Addendum Note (Signed)
Addended by: Carole Binning on: 01/14/2021 10:47 AM   Modules accepted: Orders

## 2021-01-14 NOTE — Telephone Encounter (Signed)
Lab Orders faxed

## 2021-01-14 NOTE — Telephone Encounter (Signed)
Seth Bake called patient, orders for labs placed

## 2021-01-14 NOTE — Telephone Encounter (Signed)
Patient is at [facility] in [facility] and was told her labwork orders need to be faxed to [phone]

## 2021-01-15 LAB — CBC WITH DIFFERENTIAL/PLATELET
Absolute Monocytes: 656 cells/uL (ref 200–950)
Basophils Absolute: 49 cells/uL (ref 0–200)
Basophils Relative: 0.6 %
Eosinophils Absolute: 172 cells/uL (ref 15–500)
Eosinophils Relative: 2.1 %
HCT: 43.9 % (ref 35.0–45.0)
Hemoglobin: 14.7 g/dL (ref 11.7–15.5)
Lymphs Abs: 1820 cells/uL (ref 850–3900)
MCH: 30.6 pg (ref 27.0–33.0)
MCHC: 33.5 g/dL (ref 32.0–36.0)
MCV: 91.5 fL (ref 80.0–100.0)
MPV: 12 fL (ref 7.5–12.5)
Monocytes Relative: 8 %
Neutro Abs: 5502 cells/uL (ref 1500–7800)
Neutrophils Relative %: 67.1 %
Platelets: 251 10*3/uL (ref 140–400)
RBC: 4.8 10*6/uL (ref 3.80–5.10)
RDW: 11.8 % (ref 11.0–15.0)
Total Lymphocyte: 22.2 %
WBC: 8.2 10*3/uL (ref 3.8–10.8)

## 2021-01-15 LAB — COMPLETE METABOLIC PANEL WITH GFR
AG Ratio: 1.8 (calc) (ref 1.0–2.5)
ALT: 13 U/L (ref 6–29)
AST: 22 U/L (ref 10–35)
Albumin: 4.3 g/dL (ref 3.6–5.1)
Alkaline phosphatase (APISO): 69 U/L (ref 37–153)
BUN/Creatinine Ratio: 21 (calc) (ref 6–22)
BUN: 20 mg/dL (ref 7–25)
CO2: 28 mmol/L (ref 20–32)
Calcium: 9.7 mg/dL (ref 8.6–10.4)
Chloride: 103 mmol/L (ref 98–110)
Creat: 0.95 mg/dL — ABNORMAL HIGH (ref 0.60–0.93)
GFR, Est African American: 67 mL/min/{1.73_m2} (ref >=60)
GFR, Est Non African American: 58 mL/min/{1.73_m2} — ABNORMAL LOW (ref >=60)
Globulin: 2.4 g/dL (calc) (ref 1.9–3.7)
Glucose, Bld: 81 mg/dL (ref 65–139)
Potassium: 4 mmol/L (ref 3.5–5.3)
Sodium: 139 mmol/L (ref 135–146)
Total Bilirubin: 0.6 mg/dL (ref 0.2–1.2)
Total Protein: 6.7 g/dL (ref 6.1–8.1)

## 2021-01-15 NOTE — Progress Notes (Signed)
Creatinine is mildly elevated.  Most likely due to ACE inhibitor use.  CBC is normal.  Please forward labs to patient's PCP.

## 2021-01-22 ENCOUNTER — Other Ambulatory Visit: Payer: Self-pay

## 2021-01-22 ENCOUNTER — Encounter: Payer: Self-pay | Admitting: Physician Assistant

## 2021-01-22 ENCOUNTER — Ambulatory Visit (INDEPENDENT_AMBULATORY_CARE_PROVIDER_SITE_OTHER): Payer: Medicare Other | Admitting: Physician Assistant

## 2021-01-22 VITALS — BP 154/99 | HR 70 | Resp 13 | Ht 62.0 in | Wt 152.4 lb

## 2021-01-22 DIAGNOSIS — I10 Essential (primary) hypertension: Secondary | ICD-10-CM

## 2021-01-22 DIAGNOSIS — M19041 Primary osteoarthritis, right hand: Secondary | ICD-10-CM | POA: Diagnosis not present

## 2021-01-22 DIAGNOSIS — Z79899 Other long term (current) drug therapy: Secondary | ICD-10-CM

## 2021-01-22 DIAGNOSIS — R768 Other specified abnormal immunological findings in serum: Secondary | ICD-10-CM

## 2021-01-22 DIAGNOSIS — M0579 Rheumatoid arthritis with rheumatoid factor of multiple sites without organ or systems involvement: Secondary | ICD-10-CM | POA: Diagnosis not present

## 2021-01-22 DIAGNOSIS — E559 Vitamin D deficiency, unspecified: Secondary | ICD-10-CM

## 2021-01-22 DIAGNOSIS — M19042 Primary osteoarthritis, left hand: Secondary | ICD-10-CM

## 2021-01-22 DIAGNOSIS — Z853 Personal history of malignant neoplasm of breast: Secondary | ICD-10-CM

## 2021-01-22 DIAGNOSIS — Z13820 Encounter for screening for osteoporosis: Secondary | ICD-10-CM

## 2021-01-22 DIAGNOSIS — M19072 Primary osteoarthritis, left ankle and foot: Secondary | ICD-10-CM

## 2021-01-22 DIAGNOSIS — M19071 Primary osteoarthritis, right ankle and foot: Secondary | ICD-10-CM

## 2021-01-22 DIAGNOSIS — Z780 Asymptomatic menopausal state: Secondary | ICD-10-CM

## 2021-01-22 DIAGNOSIS — Z8719 Personal history of other diseases of the digestive system: Secondary | ICD-10-CM

## 2021-01-22 DIAGNOSIS — I341 Nonrheumatic mitral (valve) prolapse: Secondary | ICD-10-CM

## 2021-01-22 DIAGNOSIS — G5701 Lesion of sciatic nerve, right lower limb: Secondary | ICD-10-CM

## 2021-01-22 NOTE — Patient Instructions (Signed)
Standing Labs We placed an order today for your standing lab work.   Please have your standing labs drawn in April and every 3 months  If possible, please have your labs drawn 2 weeks prior to your appointment so that the provider can discuss your results at your appointment.  We have open lab daily Monday through Thursday from 8:30-12:30 PM and 1:30-4:30 PM and Friday from 8:30-12:30 PM and 1:30-4:00 PM at the office of Dr. Shaili Deveshwar, [facility].   Please be advised, all patients with office appointments requiring lab work will take precedents over walk-in lab work.  If possible, please come for your lab work on Monday and Friday afternoons, as you may experience shorter wait times. The office is located at [address], [address], [place], [place] [address] No appointment is necessary.   Labs are drawn by [facility]. Please bring your co-pay at the time of your lab draw.  You may receive a bill from [facility] for your lab work.  If you wish to have your labs drawn at another location, please call the office 24 hours in advance to send orders.  If you have any questions regarding directions or hours of operation,  please call [phone].   As a reminder, please drink plenty of water prior to coming for your lab work. Thanks!  

## 2021-01-27 ENCOUNTER — Observation Stay (HOSPITAL_BASED_OUTPATIENT_CLINIC_OR_DEPARTMENT_OTHER)
Admission: EM | Admit: 2021-01-27 | Discharge: 2021-01-28 | Disposition: A | Discharge status: Home / Self Care | Payer: Medicare Other | Attending: Family Medicine | Admitting: Family Medicine

## 2021-01-27 ENCOUNTER — Emergency Department (HOSPITAL_BASED_OUTPATIENT_CLINIC_OR_DEPARTMENT_OTHER): Payer: Medicare Other

## 2021-01-27 ENCOUNTER — Emergency Department (HOSPITAL_COMMUNITY): Payer: Medicare Other

## 2021-01-27 ENCOUNTER — Encounter (HOSPITAL_BASED_OUTPATIENT_CLINIC_OR_DEPARTMENT_OTHER): Payer: Self-pay

## 2021-01-27 ENCOUNTER — Other Ambulatory Visit: Payer: Self-pay

## 2021-01-27 DIAGNOSIS — Z91040 Latex allergy status: Secondary | ICD-10-CM | POA: Diagnosis not present

## 2021-01-27 DIAGNOSIS — H539 Unspecified visual disturbance: Secondary | ICD-10-CM

## 2021-01-27 DIAGNOSIS — G459 Transient cerebral ischemic attack, unspecified: Secondary | ICD-10-CM | POA: Diagnosis not present

## 2021-01-27 DIAGNOSIS — R200 Anesthesia of skin: Secondary | ICD-10-CM

## 2021-01-27 DIAGNOSIS — M0579 Rheumatoid arthritis with rheumatoid factor of multiple sites without organ or systems involvement: Secondary | ICD-10-CM | POA: Diagnosis present

## 2021-01-27 DIAGNOSIS — R202 Paresthesia of skin: Secondary | ICD-10-CM | POA: Diagnosis present

## 2021-01-27 DIAGNOSIS — Z853 Personal history of malignant neoplasm of breast: Secondary | ICD-10-CM | POA: Diagnosis not present

## 2021-01-27 DIAGNOSIS — I10 Essential (primary) hypertension: Secondary | ICD-10-CM | POA: Diagnosis present

## 2021-01-27 DIAGNOSIS — R519 Headache, unspecified: Secondary | ICD-10-CM

## 2021-01-27 DIAGNOSIS — N183 Chronic kidney disease, stage 3 unspecified: Secondary | ICD-10-CM | POA: Insufficient documentation

## 2021-01-27 DIAGNOSIS — Z79899 Other long term (current) drug therapy: Secondary | ICD-10-CM | POA: Insufficient documentation

## 2021-01-27 DIAGNOSIS — Z20822 Contact with and (suspected) exposure to covid-19: Secondary | ICD-10-CM | POA: Insufficient documentation

## 2021-01-27 DIAGNOSIS — I129 Hypertensive chronic kidney disease with stage 1 through stage 4 chronic kidney disease, or unspecified chronic kidney disease: Secondary | ICD-10-CM | POA: Insufficient documentation

## 2021-01-27 LAB — RAPID URINE DRUG SCREEN, HOSP PERFORMED
Amphetamines: NOT DETECTED
Barbiturates: NOT DETECTED
Benzodiazepines: NOT DETECTED
Cocaine: NOT DETECTED
Opiates: NOT DETECTED
Tetrahydrocannabinol: NOT DETECTED

## 2021-01-27 LAB — URINALYSIS, ROUTINE W REFLEX MICROSCOPIC
Bilirubin Urine: NEGATIVE
Glucose, UA: NEGATIVE mg/dL
Hgb urine dipstick: NEGATIVE
Ketones, ur: NEGATIVE mg/dL
Leukocytes,Ua: NEGATIVE
Nitrite: NEGATIVE
Protein, ur: NEGATIVE mg/dL
Specific Gravity, Urine: 1.01 (ref 1.005–1.030)
pH: 6 (ref 5.0–8.0)

## 2021-01-27 LAB — COMPREHENSIVE METABOLIC PANEL
ALT: 15 U/L (ref 0–44)
AST: 25 U/L (ref 15–41)
Albumin: 4.3 g/dL (ref 3.5–5.0)
Alkaline Phosphatase: 65 U/L (ref 38–126)
Anion gap: 9 (ref 5–15)
BUN: 23 mg/dL (ref 8–23)
CO2: 27 mmol/L (ref 22–32)
Calcium: 9.6 mg/dL (ref 8.9–10.3)
Chloride: 103 mmol/L (ref 98–111)
Creatinine, Ser: 1.03 mg/dL — ABNORMAL HIGH (ref 0.44–1.00)
GFR, Estimated: 56 mL/min — ABNORMAL LOW (ref >60)
Glucose, Bld: 92 mg/dL (ref 70–99)
Potassium: 4.2 mmol/L (ref 3.5–5.1)
Sodium: 139 mmol/L (ref 135–145)
Total Bilirubin: 0.6 mg/dL (ref 0.3–1.2)
Total Protein: 7.4 g/dL (ref 6.5–8.1)

## 2021-01-27 LAB — CBC
HCT: 47.8 % — ABNORMAL HIGH (ref 36.0–46.0)
Hemoglobin: 15.8 g/dL — ABNORMAL HIGH (ref 12.0–15.0)
MCH: 30.6 pg (ref 26.0–34.0)
MCHC: 33.1 g/dL (ref 30.0–36.0)
MCV: 92.5 fL (ref 80.0–100.0)
Platelets: 249 10*3/uL (ref 150–400)
RBC: 5.17 MIL/uL — ABNORMAL HIGH (ref 3.87–5.11)
RDW: 12.4 % (ref 11.5–15.5)
WBC: 5.3 10*3/uL (ref 4.0–10.5)
nRBC: 0 % (ref 0.0–0.2)

## 2021-01-27 LAB — PROTIME-INR
INR: 1 (ref 0.8–1.2)
Prothrombin Time: 13 seconds (ref 11.4–15.2)

## 2021-01-27 LAB — DIFFERENTIAL
Abs Immature Granulocytes: 0.01 10*3/uL (ref 0.00–0.07)
Basophils Absolute: 0 10*3/uL (ref 0.0–0.1)
Basophils Relative: 1 %
Eosinophils Absolute: 0.1 10*3/uL (ref 0.0–0.5)
Eosinophils Relative: 2 %
Immature Granulocytes: 0 %
Lymphocytes Relative: 30 %
Lymphs Abs: 1.6 10*3/uL (ref 0.7–4.0)
Monocytes Absolute: 0.5 10*3/uL (ref 0.1–1.0)
Monocytes Relative: 9 %
Neutro Abs: 3.1 10*3/uL (ref 1.7–7.7)
Neutrophils Relative %: 58 %

## 2021-01-27 LAB — APTT: aPTT: 27 seconds (ref 24–36)

## 2021-01-27 LAB — ETHANOL: Alcohol, Ethyl (B): <10 mg/dL (ref <10)

## 2021-01-27 IMAGING — CT CT ANGIO HEAD
2 of 7 series · 8 of 33 positions shown · IV contrast (OMNI 350)
Comparison: None.

CLINICAL DATA: TIA. Blurry vision, facial numbness, and right-sided
headache.

EXAM:
CT ANGIOGRAPHY HEAD AND NECK
TECHNIQUE: Multidetector CT imaging of the head and neck was performed using
the standard protocol during bolus administration of intravenous
contrast. Multiplanar CT image reconstructions and MIPs were
obtained to evaluate the vascular anatomy. Carotid stenosis
measurements (when applicable) are obtained utilizing NASCET
criteria, using the distal internal carotid diameter as the
denominator.
CONTRAST:  50mL OMNIPAQUE IOHEXOL 350 MG/ML SOLN

[Series 5: cta neck · axial · 0.48mm/px · z∈[-202,-88]mm · 2 of 171 slices shown]
[im 57/171  soft-tissue]
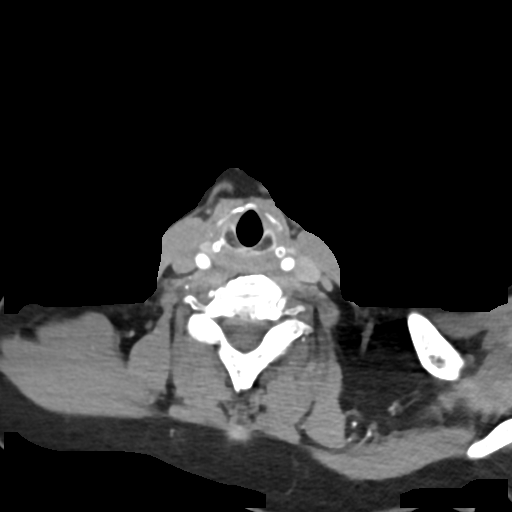
[im 114/171  bone]
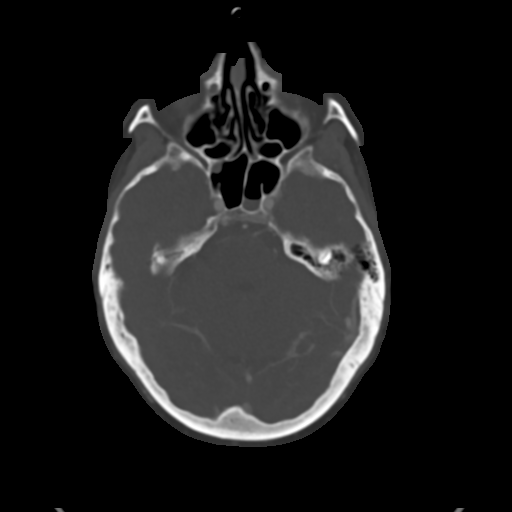

[Series 7: cta neck axial · axial · 0.50mm/px · z∈[-266,-25]mm · 6 of 339 slices shown]
[im 49/339  soft-tissue]
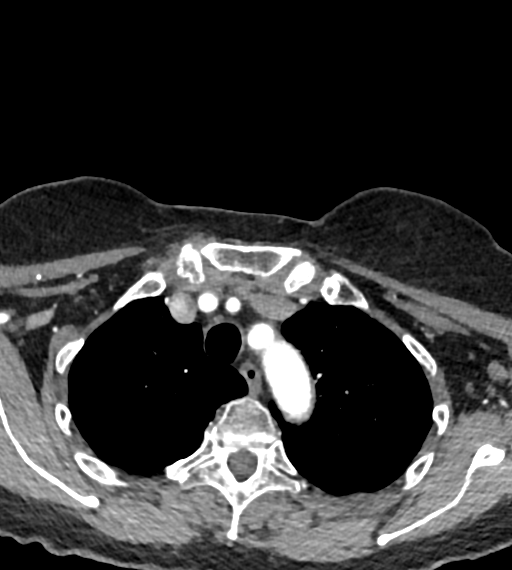
[im 97/339  soft-tissue]
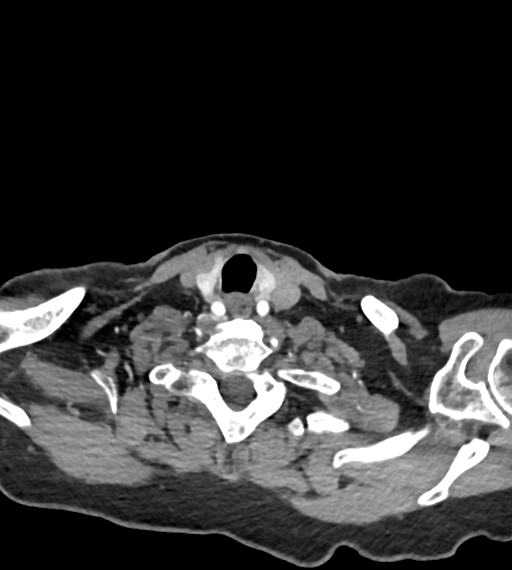
[im 145/339  soft-tissue]
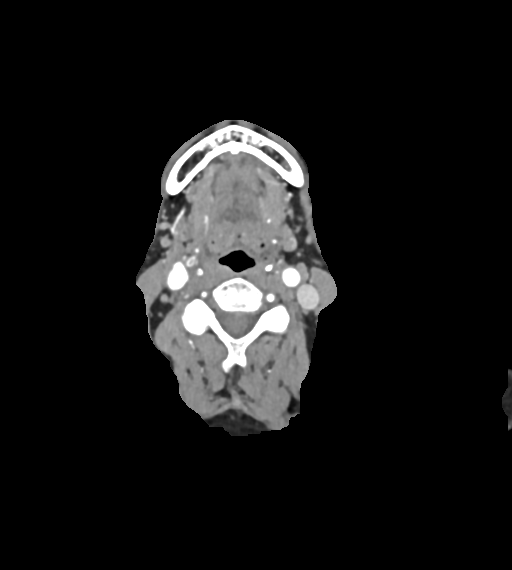
[im 194/339  soft-tissue]
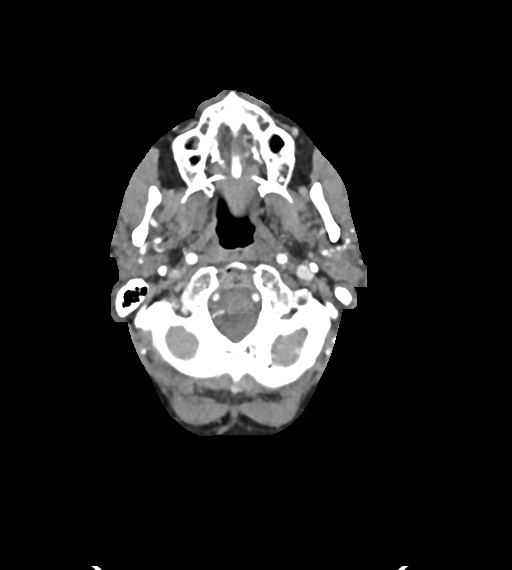
[im 242/339  soft-tissue]
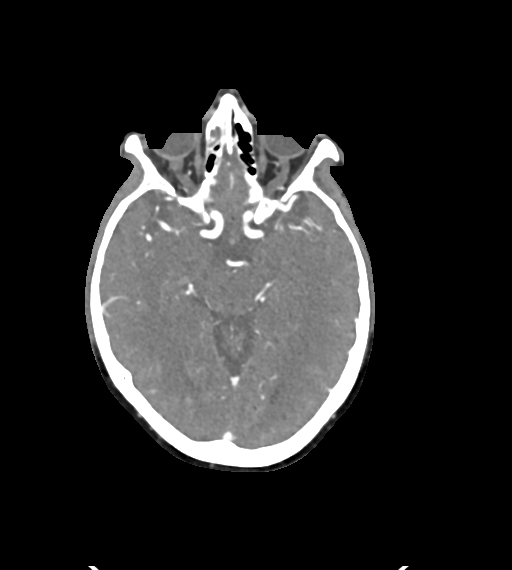
[im 290/339  soft-tissue]
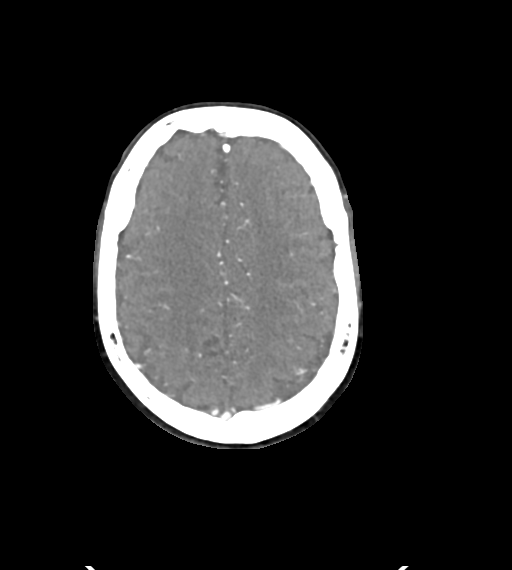

[8 of 33 positions shown; findings below may reference images not displayed]

FINDINGS: CTA NECK FINDINGS

Aortic arch: Standard 3 vessel aortic arch with minimal
atherosclerotic plaque. Widely patent arch vessel origins.

Right carotid system: Patent with minimal atherosclerotic plaque at
the carotid bifurcation. No evidence of stenosis or dissection.
Tortuous proximal common carotid artery.

Left carotid system: Patent with minimal atherosclerotic plaque at
the carotid bifurcation. No evidence of stenosis or dissection.

Vertebral arteries: Patent without evidence of stenosis or
dissection. Dominant left vertebral artery.

Skeleton: Moderate lower cervical disc degeneration. Severe left
facet arthrosis at C7-T1 with trace anterolisthesis.

Other neck: No evidence of cervical lymphadenopathy or mass.

Upper chest: Small nodules in both lung apices measuring 2 mm. No
apical lung consolidation or mass.

Review of the MIP images confirms the above findings

CTA HEAD FINDINGS

Anterior circulation: The internal carotid arteries are widely
patent from skull base to carotid termini. ACAs and MCAs are patent
without evidence of proximal branch occlusion or significant
proximal stenosis. No aneurysm is identified.

Posterior circulation: The intracranial vertebral arteries are
patent with the right being extremely diminutive distal to the PICA
origin. There is atherosclerotic irregularity of the left V4 segment
with at most mild stenosis. The right PICA and left AICA appear
dominant. Patent SCAs are seen bilaterally. The basilar artery is
widely patent. There are fetal origins of both PCAs. No significant
proximal PCA stenosis is evident. No aneurysm is identified.

Venous sinuses: As permitted by contrast timing, patent.

Anatomic variants: Fetal PCAs.

Review of the MIP images confirms the above findings
IMPRESSION: 1. Mild atherosclerosis in the head and neck without a large vessel
occlusion or flow limiting proximal stenosis.
2. 2 mm apical lung nodules. No follow-up needed if patient is
low-risk (and has no known or suspected primary neoplasm).
Non-contrast chest CT can be considered in 12 months if patient is
high-risk. This recommendation follows the consensus statement:
Guidelines for Management of Incidental Pulmonary Nodules Detected
3. Aortic Atherosclerosis ([5A]-[5A]).

## 2021-01-27 IMAGING — MR MR HEAD W/O CM
7 of 11 series · 25 of 48 positions shown · non-contrast
Comparison: None.

CLINICAL DATA: Transient ischemic attack

EXAM:
MRI HEAD WITHOUT CONTRAST
TECHNIQUE: Multiplanar, multiecho pulse sequences of the brain and surrounding
structures were obtained without intravenous contrast.

[Series 2: DWI · axial · 3.0mm · 0.94mm/px · z∈[-60,+92]mm · 7 of 104 slices shown (1 of 2)]
[im 1/104]
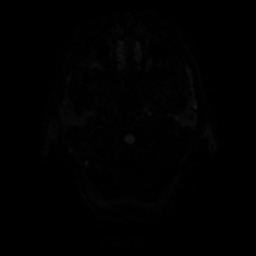
[im 18/104]
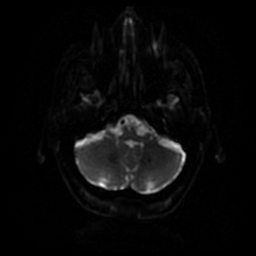
[im 35/104]
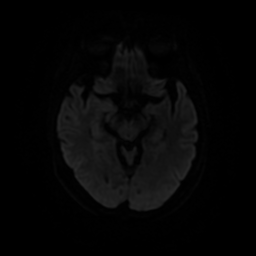
[im 52/104]
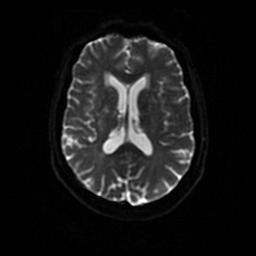
[im 69/104]
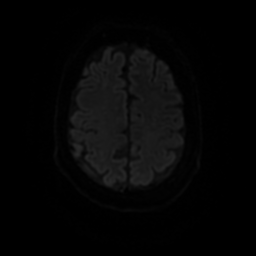
[im 86/104]
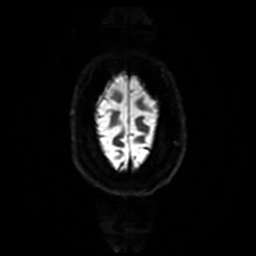
[im 104/104]
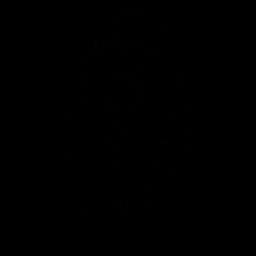

[Series 3: DWI · coronal · 4.0mm · 0.94mm/px · 6 of 74 slices shown (2 of 2)]
[im 1/74]
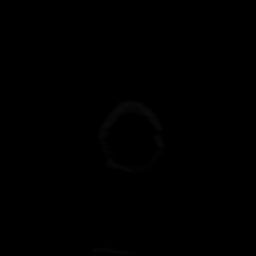
[im 15/74]
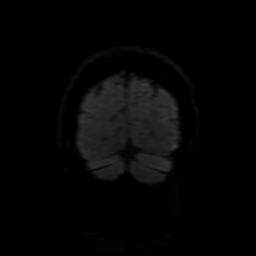
[im 30/74]
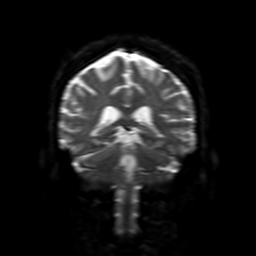
[im 44/74]
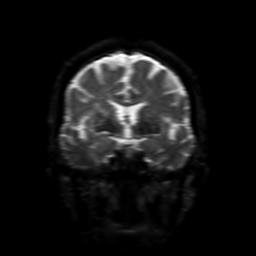
[im 59/74]
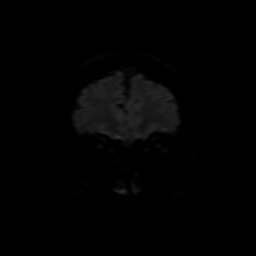
[im 74/74]
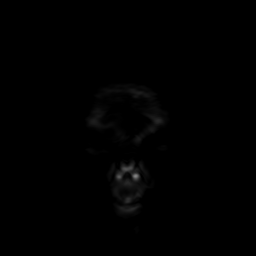

[Series 4: FLAIR · sagittal · 5.0mm · 0.23mm/px · 2 of 26 slices shown (1 of 2)]
[im 1/26]
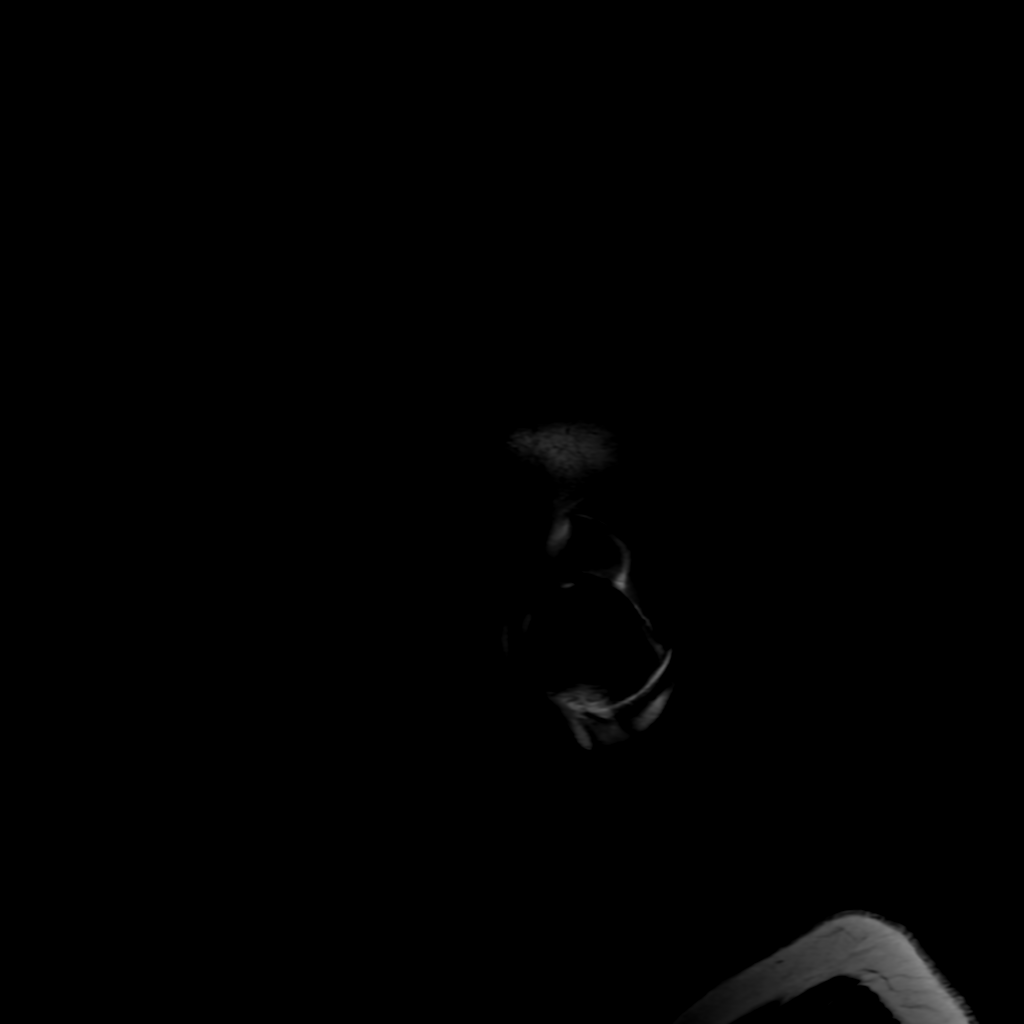
[im 26/26]
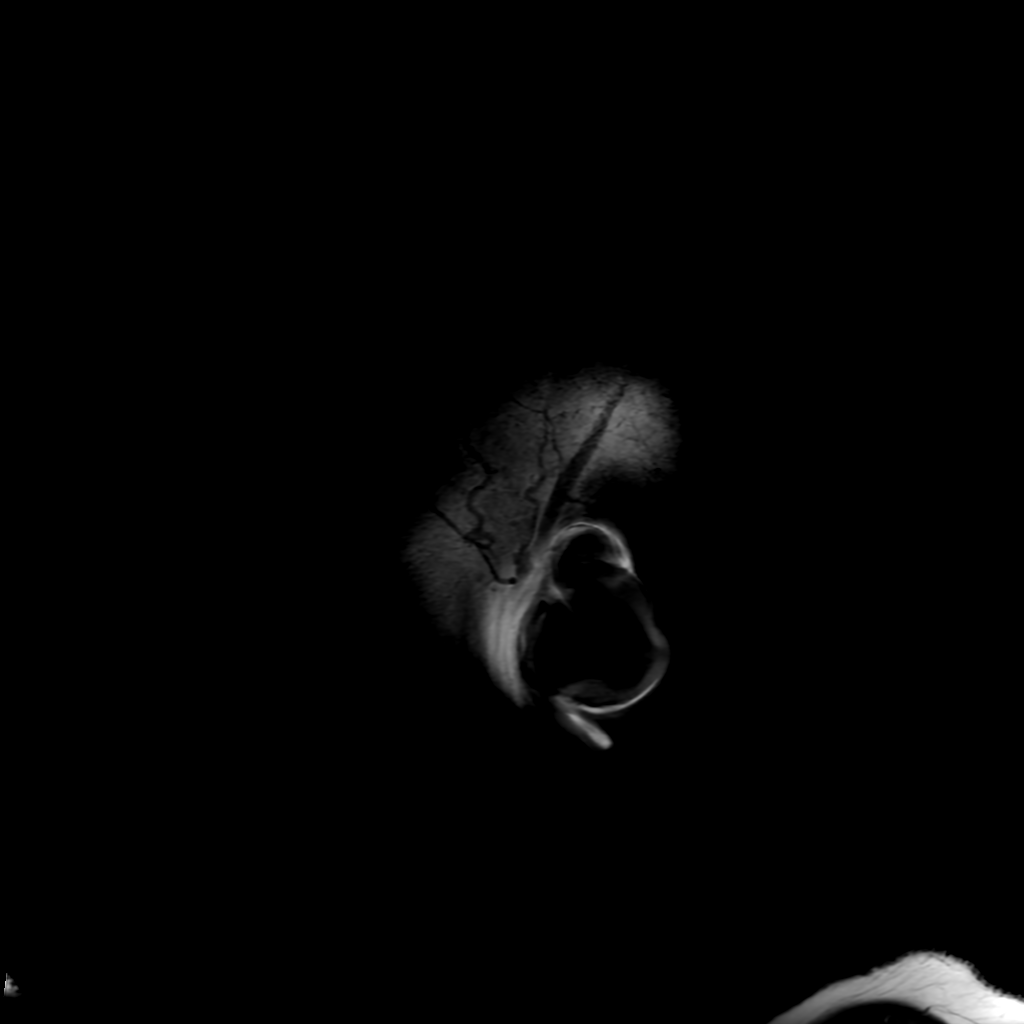

[Series 5: T2 · axial · 5.0mm · 0.23mm/px · 1 of 26 slices shown]
[im 1/26]
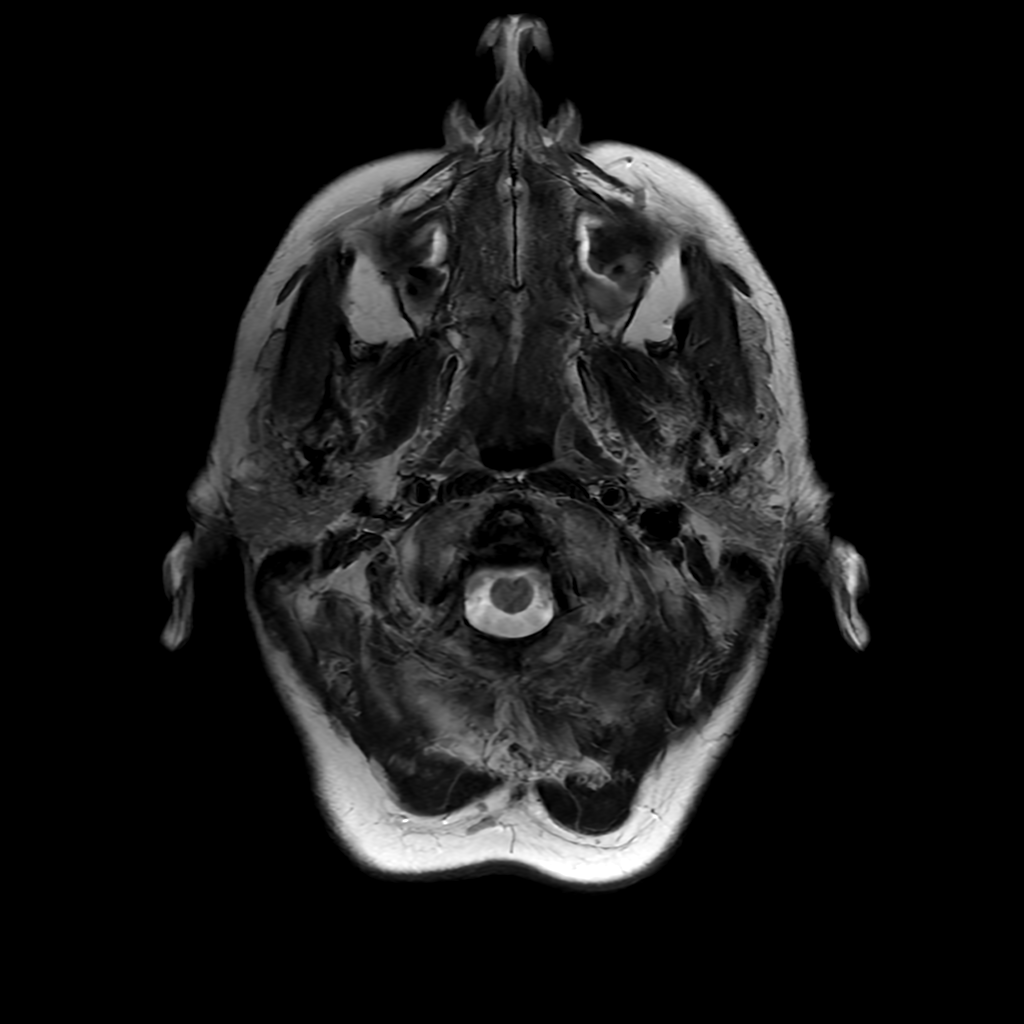

[Series 6: FLAIR · axial · 3.0mm · 0.45mm/px · z∈[-63,+87]mm · 2 of 26 slices shown (2 of 2)]
[im 1/26]
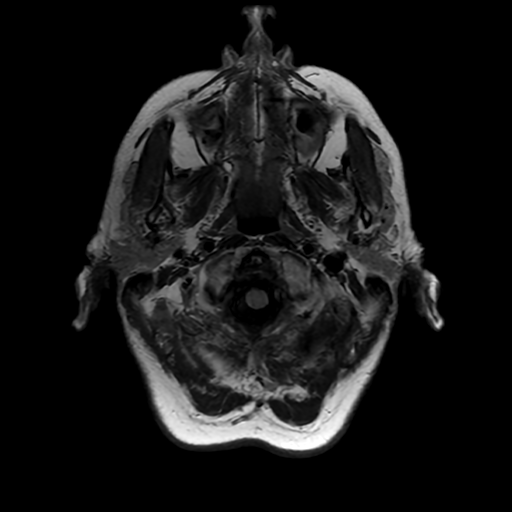
[im 26/26]
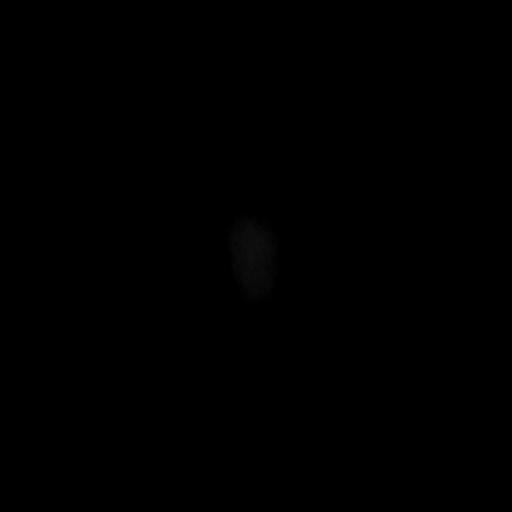

[Series 250: ADC · axial · 3.0mm · 0.94mm/px · z∈[-60,+92]mm · 4 of 52 slices shown (1 of 2)]
[im 1/52]
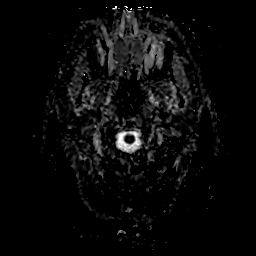
[im 18/52]
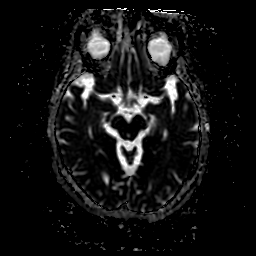
[im 35/52]
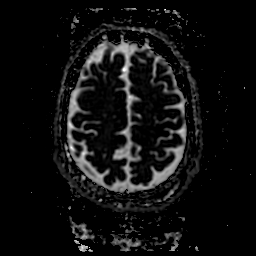
[im 52/52]
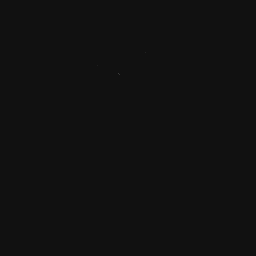

[Series 350: ADC · coronal · 4.0mm · 0.94mm/px · 3 of 36 slices shown (2 of 2)]
[im 1/36]
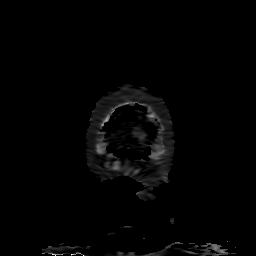
[im 18/36]
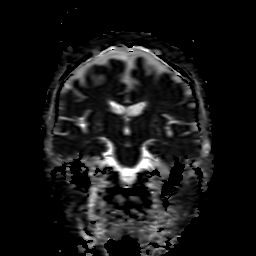
[im 36/36]
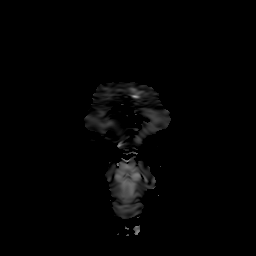

[25 of 48 positions shown; findings below may reference images not displayed]

FINDINGS: Brain: No acute infarct, mass effect or extra-axial collection. No
acute or chronic hemorrhage. There is multifocal hyperintense
T2-weighted signal within the white matter. Parenchymal volume and
CSF spaces are normal. The midline structures are normal.

Vascular: Major flow voids are preserved.

Skull and upper cervical spine: Normal calvarium and skull base.
Visualized upper cervical spine and soft tissues are normal.

Sinuses/Orbits:No paranasal sinus fluid levels or advanced mucosal
thickening. No mastoid or middle ear effusion. Normal orbits.
IMPRESSION: 1. No acute intracranial abnormality.
2. Findings of chronic small vessel ischemia.

## 2021-01-27 IMAGING — CT CT HEAD W/O CM
3 series · 16 of 47 positions shown, 19 images · non-contrast
Comparison: None.

CLINICAL DATA: Woke up this morning with vision changes, confusion,
right jaw numbness, and left hand numbness. Right parietal headache
for the past 3 weeks.

EXAM:
CT HEAD WITHOUT CONTRAST
TECHNIQUE: Contiguous axial images were obtained from the base of the skull
through the vertex without intravenous contrast.

[Series 2: head wo · axial · 0.42mm/px · z∈[-163,-28]mm · 10 of 33 slices shown, 13 images]
[im 3/33  brain]
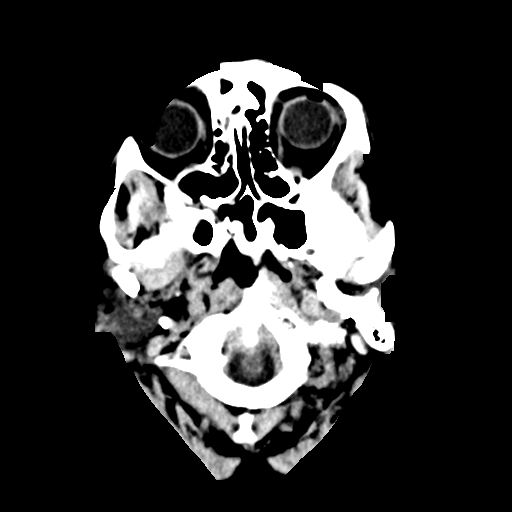
[im 3/33  bone]
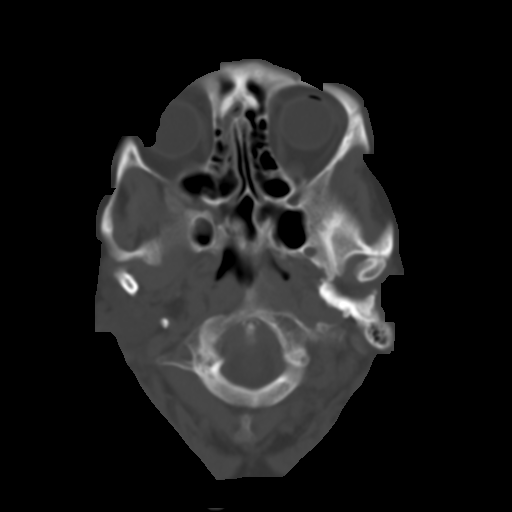
[im 6/33  brain]
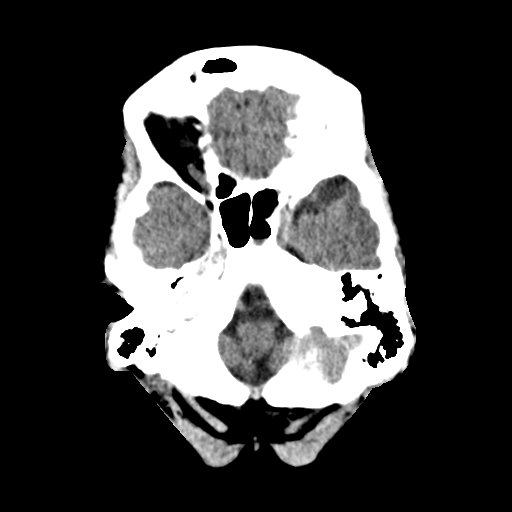
[im 9/33  brain]
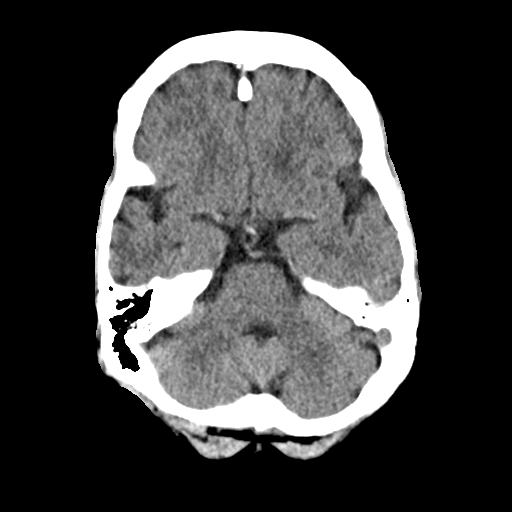
[im 12/33  brain]
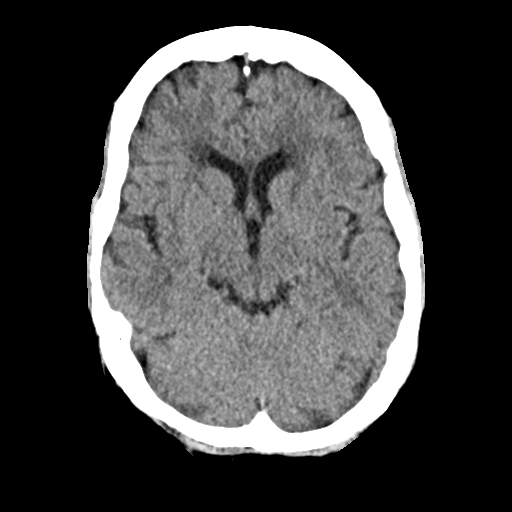
[im 15/33  brain]
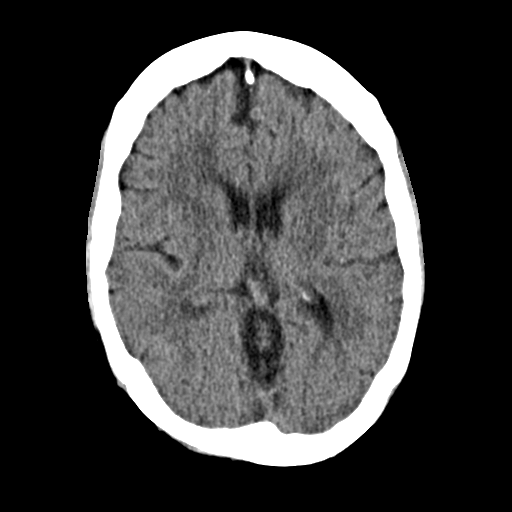
[im 15/33  bone]
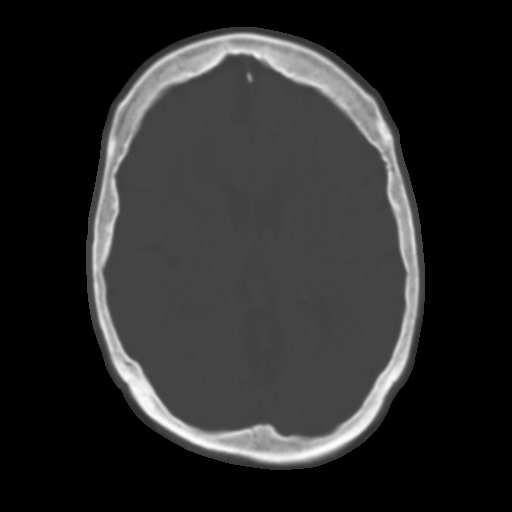
[im 18/33  brain]
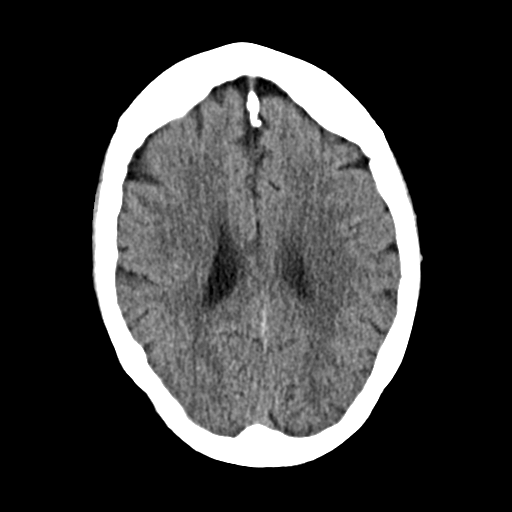
[im 21/33  brain]
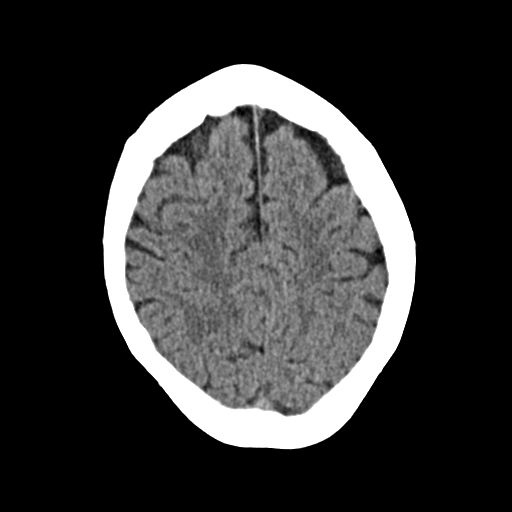
[im 25/33  brain]
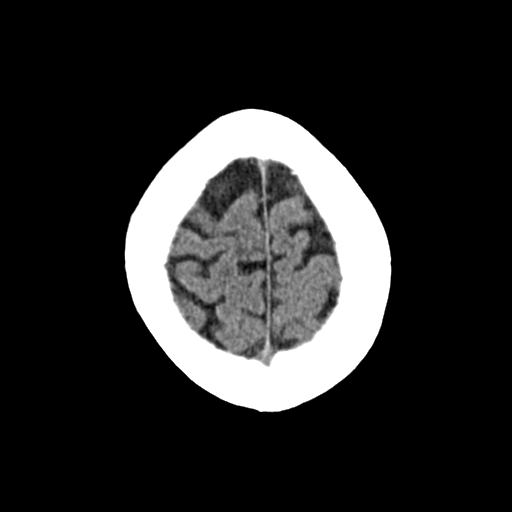
[im 27/33  brain]
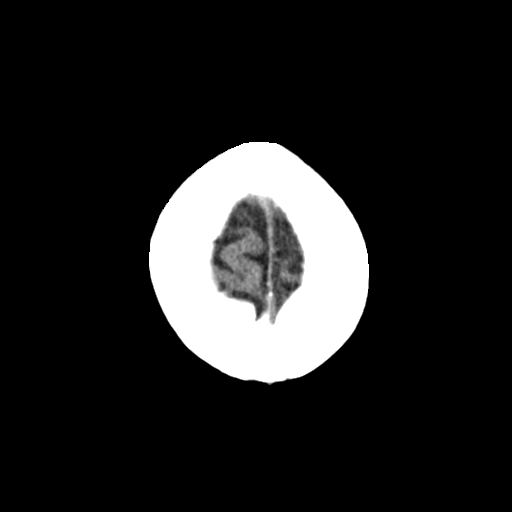
[im 27/33  bone]
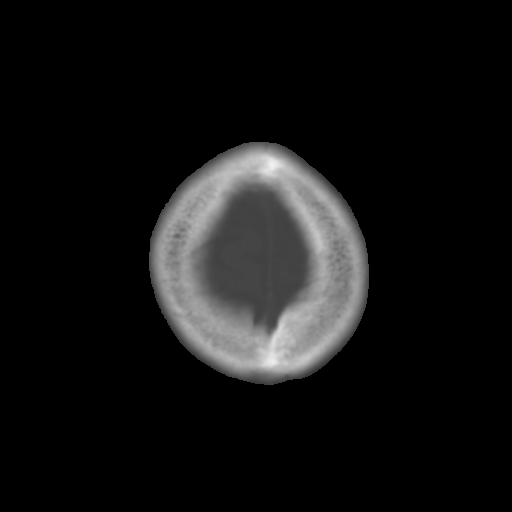
[im 30/33  brain]
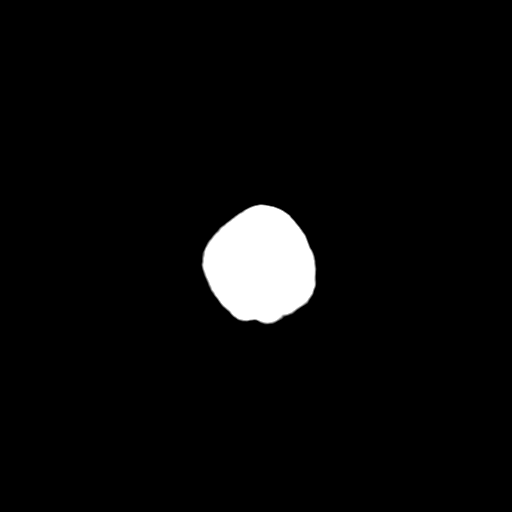

[Series 4: coronal soft · coronal · 0.33mm/px · 3 of 68 slices shown]
[im 23/68  brain]
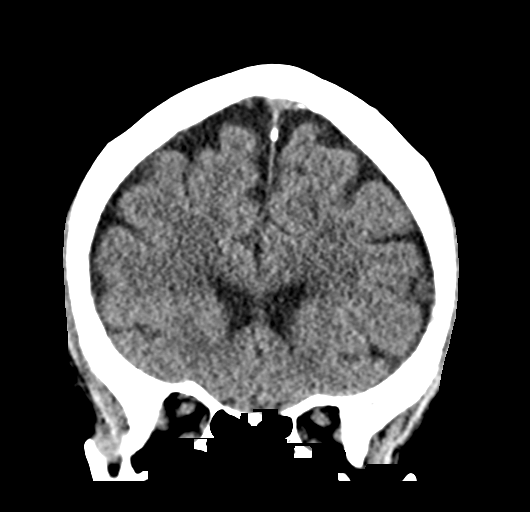
[im 30/68  brain]
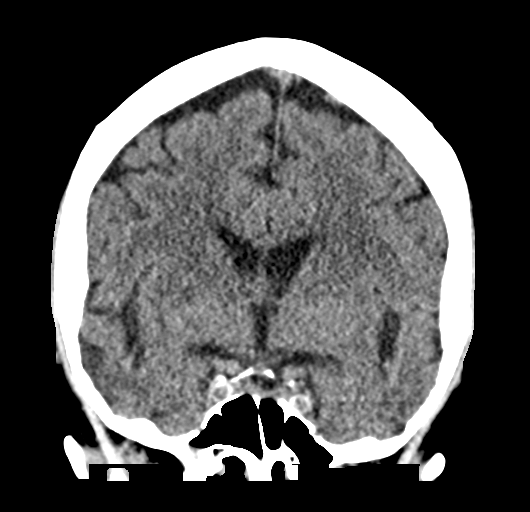
[im 38/68  brain]
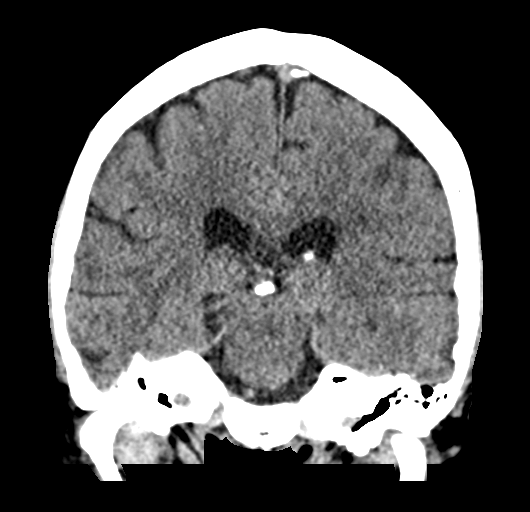

[Series 5: sag soft · sagittal · 0.33mm/px · 3 of 52 slices shown]
[im 18/52  brain]
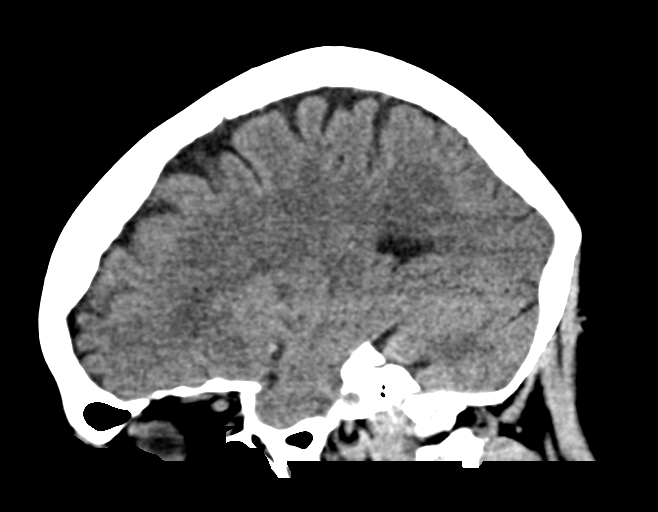
[im 26/52  brain]
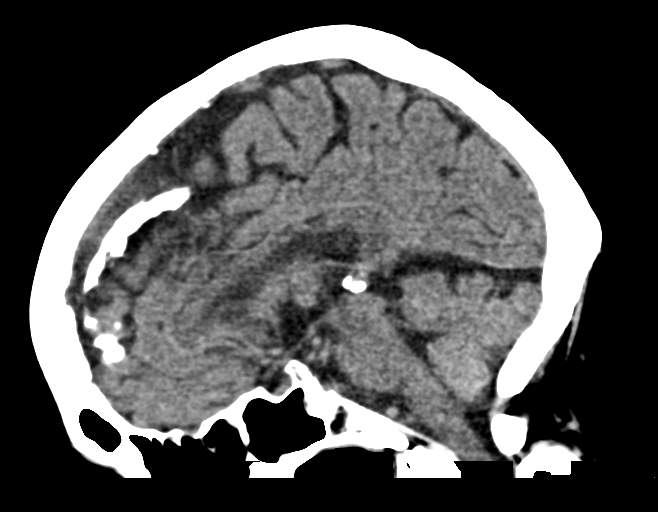
[im 35/52  brain]
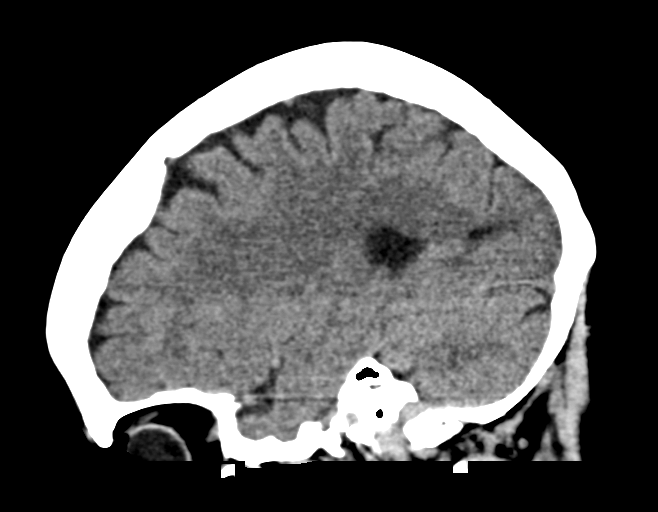

[16 of 47 positions shown; findings below may reference images not displayed]

FINDINGS: Brain: No evidence of acute infarction, hemorrhage, hydrocephalus,
extra-axial collection or mass lesion/mass effect. Mild generalized
cerebral atrophy. Scattered mild periventricular and subcortical
white matter hypodensities are nonspecific, but favored to reflect
chronic microvascular ischemic changes.

Vascular: No hyperdense vessel or unexpected calcification.

Skull: Normal. Negative for fracture or focal lesion.

Sinuses/Orbits: No acute finding. Small air-fluid level in the left
sphenoid sinus.

Other: None.
IMPRESSION: 1. No acute intracranial abnormality.
2. Mild cerebral atrophy and chronic microvascular ischemic changes.

## 2021-01-27 MED ORDER — IOHEXOL 350 MG/ML SOLN
50.0000 mL | Freq: Once | INTRAVENOUS | Status: AC | PRN
  Administered 2021-01-27: 50 mL via INTRAVENOUS

## 2021-01-27 MED ORDER — DIPHENHYDRAMINE HCL 50 MG/ML IJ SOLN
12.5000 mg | Freq: Once | INTRAMUSCULAR | Status: AC
  Administered 2021-01-27: 12.5 mg via INTRAVENOUS
  Filled 2021-01-27 (×2): qty 1

## 2021-01-27 MED ORDER — DIPHENHYDRAMINE HCL 25 MG PO CAPS: 50.0000 mg | ORAL_CAPSULE | Freq: Once | ORAL | Status: AC

## 2021-01-27 MED ORDER — PROCHLORPERAZINE EDISYLATE 10 MG/2ML IJ SOLN
5.0000 mg | Freq: Once | INTRAMUSCULAR | Status: AC
  Administered 2021-01-27: 5 mg via INTRAVENOUS
  Filled 2021-01-27 (×2): qty 2

## 2021-01-27 MED ORDER — DIPHENHYDRAMINE HCL 50 MG/ML IJ SOLN
50.0000 mg | Freq: Once | INTRAMUSCULAR | Status: AC
  Administered 2021-01-27: 50 mg via INTRAVENOUS
  Filled 2021-01-27: qty 1

## 2021-01-27 MED ORDER — HYDROCORTISONE NA SUCCINATE PF 100 MG IJ SOLR
200.0000 mg | Freq: Once | INTRAMUSCULAR | Status: AC
  Administered 2021-01-27: 200 mg via INTRAVENOUS
  Filled 2021-01-27 (×2): qty 4

## 2021-01-27 MED ORDER — HYDROCORTISONE NA SUCCINATE PF 250 MG IJ SOLR: 200.0000 mg | Freq: Once | INTRAMUSCULAR | Status: DC

## 2021-01-27 NOTE — ED Notes (Signed)
Patient transported to CT 

## 2021-01-27 NOTE — ED Notes (Signed)
Patient transported to MRI 

## 2021-01-27 NOTE — ED Provider Notes (Signed)
Patient sent ED to ED from [place] for stroke work-up.  In short, patient is 80 year old female with history of rheumatoid arthritis on Enbrel who presented to the ED with complaints of right-sided headache, blurred vision, and facial numbness.  Patient was concerned given her elevated blood pressures at home.  Dr. Theda Sers, neurology, was consulted and recommended CTA head and neck as well as MRI of brain.  If negative, plan is for likely dispo home.  Patient has a contrast dye allergy, patient will require steroid premedication.  Physical exam per sending physician was notable for serous effusion to bilateral TMs and subjective decreased sensation to light touch on right side of face.  No other focal deficits.  Her symptoms had resolved at time of the evaluation.  On my examination, patient reports that this morning when she got up out of bed she began to experience blurred vision and "floaters" in her vision.  She then felt a "weird sensation" in her chest.  She then developed the numbness paresthesias involving her chin, most notably on the right side.  She also experienced left forearm paresthesias.  She denies any focal weakness, but states that she is generally weak in the morning due to her rheumatoid arthritis.  She states that she then was having trouble working a remote control and felt confused.  These symptoms lasted approximately 1 hour before improving.  She denies any persisting symptoms at this time.  She states that her blood pressure medications were adjusted a few months ago and she was unsure if that was a contributing factor.  Physical Exam  BP (!) 148/89 (BP Location: Right Arm)   Pulse 92   Temp 98.1 F (36.7 C) (Oral)   Resp 20   Ht 5\' 2"  (1.575 m)   Wt 67.1 kg   SpO2 98%   BMI 27.07 kg/m   Physical Exam Vitals and nursing note reviewed. Exam conducted with a chaperone present.  HENT:     Head: Normocephalic and atraumatic.  Eyes:     General: No scleral  icterus.    Extraocular Movements: Extraocular movements intact.     Conjunctiva/sclera: Conjunctivae normal.     Pupils: Pupils are equal, round, and reactive to light.  Cardiovascular:     Rate and Rhythm: Normal rate.     Pulses: Normal pulses.  Pulmonary:     Effort: Pulmonary effort is normal. No respiratory distress.  Skin:    General: Skin is dry.  Neurological:     General: No focal deficit present.     Mental Status: She is alert and oriented to person, place, and time.     GCS: GCS eye subscore is 4. GCS verbal subscore is 5. GCS motor subscore is 6.     Cranial Nerves: No cranial nerve deficit.     Motor: No weakness.  Psychiatric:        Mood and Affect: Mood normal.        Behavior: Behavior normal.        Thought Content: Thought content normal.    ED Course/Procedures   Clinical Course as of 01/27/21 2336  Wed Jan 27, 2021  2322 I spoke with Dr. Lorrin Goodell.  Questions whether not this was related to hypertension versus TIA.  He recommends admission to hospitalist for TIA work-up.  He will place additional orders and see her shortly.   [GG]  2334 I spoke with Dr. Tonie Griffith who will admit patient. [GG]    Clinical Course  User Index [GG] Corena Herter, PA-C    Procedures Results for orders placed or performed during the hospital encounter of 01/27/21  Ethanol  Result Value Ref Range   Alcohol, Ethyl (B) <10 <10 mg/dL  Protime-INR  Result Value Ref Range   Prothrombin Time 13.0 11.4 - 15.2 seconds   INR 1.0 0.8 - 1.2  APTT  Result Value Ref Range   aPTT 27 24 - 36 seconds  CBC  Result Value Ref Range   WBC 5.3 4.0 - 10.5 K/uL   RBC 5.17 (H) 3.87 - 5.11 MIL/uL   Hemoglobin 15.8 (H) 12.0 - 15.0 g/dL   HCT 47.8 (H) 36.0 - 46.0 %   MCV 92.5 80.0 - 100.0 fL   MCH 30.6 26.0 - 34.0 pg   MCHC 33.1 30.0 - 36.0 g/dL   RDW 12.4 11.5 - 15.5 %   Platelets 249 150 - 400 K/uL   nRBC 0.0 0.0 - 0.2 %  Differential  Result Value Ref Range   Neutrophils Relative  % 58 %   Neutro Abs 3.1 1.7 - 7.7 K/uL   Lymphocytes Relative 30 %   Lymphs Abs 1.6 0.7 - 4.0 K/uL   Monocytes Relative 9 %   Monocytes Absolute 0.5 0.1 - 1.0 K/uL   Eosinophils Relative 2 %   Eosinophils Absolute 0.1 0.0 - 0.5 K/uL   Basophils Relative 1 %   Basophils Absolute 0.0 0.0 - 0.1 K/uL   Immature Granulocytes 0 %   Abs Immature Granulocytes 0.01 0.00 - 0.07 K/uL  Comprehensive metabolic panel  Result Value Ref Range   Sodium 139 135 - 145 mmol/L   Potassium 4.2 3.5 - 5.1 mmol/L   Chloride 103 98 - 111 mmol/L   CO2 27 22 - 32 mmol/L   Glucose, Bld 92 70 - 99 mg/dL   BUN 23 8 - 23 mg/dL   Creatinine, Ser 1.03 (H) 0.44 - 1.00 mg/dL   Calcium 9.6 8.9 - 10.3 mg/dL   Total Protein 7.4 6.5 - 8.1 g/dL   Albumin 4.3 3.5 - 5.0 g/dL   AST 25 15 - 41 U/L   ALT 15 0 - 44 U/L   Alkaline Phosphatase 65 38 - 126 U/L   Total Bilirubin 0.6 0.3 - 1.2 mg/dL   GFR, Estimated 56 (L) >60 mL/min   Anion gap 9 5 - 15  Urine rapid drug screen (hosp performed)  Result Value Ref Range   Opiates NONE DETECTED NONE DETECTED   Cocaine NONE DETECTED NONE DETECTED   Benzodiazepines NONE DETECTED NONE DETECTED   Amphetamines NONE DETECTED NONE DETECTED   Tetrahydrocannabinol NONE DETECTED NONE DETECTED   Barbiturates NONE DETECTED NONE DETECTED  Urinalysis, Routine w reflex microscopic Urine, Clean Catch  Result Value Ref Range   Color, Urine YELLOW YELLOW   APPearance CLEAR CLEAR   Specific Gravity, Urine 1.010 1.005 - 1.030   pH 6.0 5.0 - 8.0   Glucose, UA NEGATIVE NEGATIVE mg/dL   Hgb urine dipstick NEGATIVE NEGATIVE   Bilirubin Urine NEGATIVE NEGATIVE   Ketones, ur NEGATIVE NEGATIVE mg/dL   Protein, ur NEGATIVE NEGATIVE mg/dL   Nitrite NEGATIVE NEGATIVE   Leukocytes,Ua NEGATIVE NEGATIVE   CT Angio Head W or Wo Contrast  Result Date: 01/27/2021 CLINICAL DATA:  TIA. Blurry vision, facial numbness, and right-sided headache. EXAM: CT ANGIOGRAPHY HEAD AND NECK TECHNIQUE: Multidetector CT  imaging of the head and neck was performed using the standard protocol during bolus administration of intravenous contrast.  Multiplanar CT image reconstructions and MIPs were obtained to evaluate the vascular anatomy. Carotid stenosis measurements (when applicable) are obtained utilizing NASCET criteria, using the distal internal carotid diameter as the denominator. CONTRAST:  32mL OMNIPAQUE IOHEXOL 350 MG/ML SOLN COMPARISON:  None. FINDINGS: CTA NECK FINDINGS Aortic arch: Standard 3 vessel aortic arch with minimal atherosclerotic plaque. Widely patent arch vessel origins. Right carotid system: Patent with minimal atherosclerotic plaque at the carotid bifurcation. No evidence of stenosis or dissection. Tortuous proximal common carotid artery. Left carotid system: Patent with minimal atherosclerotic plaque at the carotid bifurcation. No evidence of stenosis or dissection. Vertebral arteries: Patent without evidence of stenosis or dissection. Dominant left vertebral artery. Skeleton: Moderate lower cervical disc degeneration. Severe left facet arthrosis at C7-T1 with trace anterolisthesis. Other neck: No evidence of cervical lymphadenopathy or mass. Upper chest: Small nodules in both lung apices measuring 2 mm. No apical lung consolidation or mass. Review of the MIP images confirms the above findings CTA HEAD FINDINGS Anterior circulation: The internal carotid arteries are widely patent from skull base to carotid termini. ACAs and MCAs are patent without evidence of proximal branch occlusion or significant proximal stenosis. No aneurysm is identified. Posterior circulation: The intracranial vertebral arteries are patent with the right being extremely diminutive distal to the PICA origin. There is atherosclerotic irregularity of the left V4 segment with at most mild stenosis. The right PICA and left AICA appear dominant. Patent SCAs are seen bilaterally. The basilar artery is widely patent. There are fetal origins of  both PCAs. No significant proximal PCA stenosis is evident. No aneurysm is identified. Venous sinuses: As permitted by contrast timing, patent. Anatomic variants: Fetal PCAs. Review of the MIP images confirms the above findings IMPRESSION: 1. Mild atherosclerosis in the head and neck without a large vessel occlusion or flow limiting proximal stenosis. 2. 2 mm apical lung nodules. No follow-up needed if patient is low-risk (and has no known or suspected primary neoplasm). Non-contrast chest CT can be considered in 12 months if patient is high-risk. This recommendation follows the consensus statement: Guidelines for Management of Incidental Pulmonary Nodules Detected on CT Images: From the Fleischner Society 2017; Radiology 2017; 284:228-243. 3. Aortic Atherosclerosis (ICD10-I70.0). Electronically Signed   By: Logan Bores M.D.   On: 01/27/2021 18:11   CT HEAD WO CONTRAST  Result Date: 01/27/2021 CLINICAL DATA:  Woke up this morning with vision changes, confusion, right jaw numbness, and left hand numbness. Right parietal headache for the past 3 weeks. EXAM: CT HEAD WITHOUT CONTRAST TECHNIQUE: Contiguous axial images were obtained from the base of the skull through the vertex without intravenous contrast. COMPARISON:  None. FINDINGS: Brain: No evidence of acute infarction, hemorrhage, hydrocephalus, extra-axial collection or mass lesion/mass effect. Mild generalized cerebral atrophy. Scattered mild periventricular and subcortical white matter hypodensities are nonspecific, but favored to reflect chronic microvascular ischemic changes. Vascular: No hyperdense vessel or unexpected calcification. Skull: Normal. Negative for fracture or focal lesion. Sinuses/Orbits: No acute finding. Small air-fluid level in the left sphenoid sinus. Other: None. IMPRESSION: 1. No acute intracranial abnormality. 2. Mild cerebral atrophy and chronic microvascular ischemic changes. Electronically Signed   By: Titus Dubin M.D.   On:  01/27/2021 09:26   CT Angio Neck W and/or Wo Contrast  Result Date: 01/27/2021 CLINICAL DATA:  TIA. Blurry vision, facial numbness, and right-sided headache. EXAM: CT ANGIOGRAPHY HEAD AND NECK TECHNIQUE: Multidetector CT imaging of the head and neck was performed using the standard protocol during bolus administration of intravenous  contrast. Multiplanar CT image reconstructions and MIPs were obtained to evaluate the vascular anatomy. Carotid stenosis measurements (when applicable) are obtained utilizing NASCET criteria, using the distal internal carotid diameter as the denominator. CONTRAST:  37mL OMNIPAQUE IOHEXOL 350 MG/ML SOLN COMPARISON:  None. FINDINGS: CTA NECK FINDINGS Aortic arch: Standard 3 vessel aortic arch with minimal atherosclerotic plaque. Widely patent arch vessel origins. Right carotid system: Patent with minimal atherosclerotic plaque at the carotid bifurcation. No evidence of stenosis or dissection. Tortuous proximal common carotid artery. Left carotid system: Patent with minimal atherosclerotic plaque at the carotid bifurcation. No evidence of stenosis or dissection. Vertebral arteries: Patent without evidence of stenosis or dissection. Dominant left vertebral artery. Skeleton: Moderate lower cervical disc degeneration. Severe left facet arthrosis at C7-T1 with trace anterolisthesis. Other neck: No evidence of cervical lymphadenopathy or mass. Upper chest: Small nodules in both lung apices measuring 2 mm. No apical lung consolidation or mass. Review of the MIP images confirms the above findings CTA HEAD FINDINGS Anterior circulation: The internal carotid arteries are widely patent from skull base to carotid termini. ACAs and MCAs are patent without evidence of proximal branch occlusion or significant proximal stenosis. No aneurysm is identified. Posterior circulation: The intracranial vertebral arteries are patent with the right being extremely diminutive distal to the PICA origin. There is  atherosclerotic irregularity of the left V4 segment with at most mild stenosis. The right PICA and left AICA appear dominant. Patent SCAs are seen bilaterally. The basilar artery is widely patent. There are fetal origins of both PCAs. No significant proximal PCA stenosis is evident. No aneurysm is identified. Venous sinuses: As permitted by contrast timing, patent. Anatomic variants: Fetal PCAs. Review of the MIP images confirms the above findings IMPRESSION: 1. Mild atherosclerosis in the head and neck without a large vessel occlusion or flow limiting proximal stenosis. 2. 2 mm apical lung nodules. No follow-up needed if patient is low-risk (and has no known or suspected primary neoplasm). Non-contrast chest CT can be considered in 12 months if patient is high-risk. This recommendation follows the consensus statement: Guidelines for Management of Incidental Pulmonary Nodules Detected on CT Images: From the Fleischner Society 2017; Radiology 2017; 284:228-243. 3. Aortic Atherosclerosis (ICD10-I70.0). Electronically Signed   By: Logan Bores M.D.   On: 01/27/2021 18:11   MR BRAIN WO CONTRAST  Result Date: 01/27/2021 CLINICAL DATA:  Transient ischemic attack EXAM: MRI HEAD WITHOUT CONTRAST TECHNIQUE: Multiplanar, multiecho pulse sequences of the brain and surrounding structures were obtained without intravenous contrast. COMPARISON:  None. FINDINGS: Brain: No acute infarct, mass effect or extra-axial collection. No acute or chronic hemorrhage. There is multifocal hyperintense T2-weighted signal within the white matter. Parenchymal volume and CSF spaces are normal. The midline structures are normal. Vascular: Major flow voids are preserved. Skull and upper cervical spine: Normal calvarium and skull base. Visualized upper cervical spine and soft tissues are normal. Sinuses/Orbits:No paranasal sinus fluid levels or advanced mucosal thickening. No mastoid or middle ear effusion. Normal orbits. IMPRESSION: 1. No acute  intracranial abnormality. 2. Findings of chronic small vessel ischemia. Electronically Signed   By: Ulyses Jarred M.D.   On: 01/27/2021 20:37    MDM   Victoria Sharp was evaluated in Emergency Department on 01/27/2021 for the symptoms described in the history of present illness. She was evaluated in the context of the global COVID-19 pandemic, which necessitated consideration that the patient might be at risk for infection with the SARS-CoV-2 virus that causes COVID-19. Institutional protocols and algorithms  that pertain to the evaluation of patients at risk for COVID-19 are in a state of rapid change based on information released by regulatory bodies including the CDC and federal and state organizations. These policies and algorithms were followed during the patient's care in the ED.  I personally reviewed patient's medical chart and all notes from triage and staff during today's encounter. I have also ordered and reviewed all labs and imaging that I felt to be medically necessary in the evaluation of this patient's complaints and with consideration of their with their physical exam. If needed, translation services were available and utilized.   I spoke with Dr. Theda Sers, neurology, who will see patient at bedside.  CTA head and neck as well as MRI brain ordered and pending.  I spoke with Dr. Lorrin Goodell.  Questions whether not this was related to hypertension versus TIA.  He recommends admission to hospitalist for TIA work-up.  He will place additional orders and see her shortly.    I spoke with hospitalist, Dr. Tonie Griffith, who will admit patient.     Corena Herter, PA-C 01/27/21 2336    Carmin Muskrat, MD 01/28/21 [phone]

## 2021-01-27 NOTE — ED Triage Notes (Signed)
Pt arrives with c/o headache, facial numbness, and headache states that when she woke up this morning she felt that she "couldn't see as well" states that she has had a headache off and on for a few weeks reports she felt some numbness in her face and throat that started on one side and moved to the other side. Pt reports being on a new medication and monitoring her BP this morning she got 178/98 at home around 530 am, took it again at 756 am and got 213/128 in her left arm and 208/129 in her right arm.

## 2021-01-27 NOTE — ED Provider Notes (Signed)
[facility] EMERGENCY DEPARTMENT Provider Note   CSN: [id number] Arrival date & time: 01/27/21  [id number]     History Chief Complaint  Patient presents with  . Numbness    Victoria Sharp is a 80 y.o. female.  80 yo F with a chief complaint of blurry vision facial numbness and a right-sided headache.  Patient has had a right-sided headache for a couple weeks now.  Woke up this morning and felt like something was not right and when she turned the lights on to look into the mirror realized that her vision was somewhat blurry.  She thinks it was worse on the right than the left.  Did not try to close 1 eye to see if there is any change.  She will like the right side of her face was also weak but has trouble quantifying this.  She denied arm or leg weakness denies difficulty with speech or swallowing.  She did feel little bit confused.  Her vision improved but then she felt like she was tingling all over her face.  Tingling went into her arms.  Her vision changes that happened to her previously when she had had a migraine headache.  Has not had 1 of those in some time.  Checked her blood pressure at home and was concerned because it was elevated.  Symptoms lasted in total of about 20 minutes.  Still has a mild right-sided headache but otherwise symptoms had resolved.  Came to the ED for evaluation.  Has been having issues with blood pressure elevation.  Had recent medication change.  The history is provided by the patient.  Illness Severity:  Moderate Onset quality:  Gradual Duration:  20 minutes Timing:  Rare Progression:  Resolved Chronicity:  New Associated symptoms: no chest pain, no congestion, no fever, no headaches, no myalgias, no nausea, no rhinorrhea, no shortness of breath, no vomiting and no wheezing        Past Medical History:  Diagnosis Date  . Dermatitis   . History of breast cancer   . Hypertension   . Rheumatoid arthritis ([place])   . Tachycardia      Patient Active Problem List   Diagnosis Date Noted  . Rheumatoid arthritis with rheumatoid factor of multiple sites without organ or systems involvement ([place]) 06/26/2020  . Essential hypertension 06/26/2020  . MVP (mitral valve prolapse) 06/26/2020  . History of Barrett's esophagus 06/26/2020  . History of gastroesophageal reflux (GERD) 06/26/2020  . History of breast cancer 06/26/2020  . High risk medication use 06/26/2020    Past Surgical History:  Procedure Laterality Date  . ABDOMINAL HYSTERECTOMY    . BREAST LUMPECTOMY Left 1998  . NEUROMA SURGERY Right 03/2020  . WEDGE RESECTION       OB History   No obstetric history on file.     Family History  Problem Relation Age of Onset  . Heart attack Father   . Kidney disease Brother   . Cancer Brother     Social History   Tobacco Use  . Smoking status: Never Smoker  . Smokeless tobacco: Never Used  Vaping Use  . Vaping Use: Never used  Substance Use Topics  . Alcohol use: Yes    Comment: Socially   . Drug use: Never    Home Medications Prior to Admission medications   Medication Sig Start Date End Date Taking? Authorizing Provider  Etanercept (ENBREL MINI) 50 MG/ML SOCT Inject 50 mg into the skin once a week. 09/24/20  Bo Merino, MD  losartan (COZAAR) 100 MG tablet Take 100 mg by mouth daily. 10/29/20   [provider]  pantoprazole (PROTONIX) 20 MG tablet Take 20 mg by mouth daily. 07/05/17   [provider]    Allergies    Contrast media [iodinated diagnostic agents], Latex, Leflunomide, Methotrexate derivatives, Metronidazole, and Sulfamethoxazole-trimethoprim  Review of Systems   Review of Systems  Constitutional: Negative for chills and fever.  HENT: Negative for congestion, rhinorrhea and trouble swallowing.   Eyes: Negative for redness and visual disturbance.  Respiratory: Negative for shortness of breath and wheezing.   Cardiovascular: Negative for chest pain and  palpitations.  Gastrointestinal: Negative for nausea and vomiting.  Genitourinary: Negative for dysuria and urgency.  Musculoskeletal: Negative for arthralgias and myalgias.  Skin: Negative for pallor and wound.  Neurological: Positive for numbness (tingling to face). Negative for dizziness and headaches.    Physical Exam Updated Vital Signs BP (!) 185/96 (BP Location: Right Arm)   Pulse 73   Temp 97.8 F (36.6 C) (Oral)   Resp 18   Ht 5\' 2"  (1.575 m)   Wt 67.1 kg   SpO2 98%   BMI 27.07 kg/m   Physical Exam Vitals and nursing note reviewed.  Constitutional:      General: She is not in acute distress.    Appearance: She is well-developed and well-nourished. She is not diaphoretic.  HENT:     Head: Normocephalic and atraumatic.     Comments: Serous effusion to bilateral TMs.  Mild tenderness with percussion to the right maxillary sinus. Eyes:     Extraocular Movements: EOM normal.     Pupils: Pupils are equal, round, and reactive to light.  Cardiovascular:     Rate and Rhythm: Normal rate and regular rhythm.     Heart sounds: No murmur heard. No friction rub. No gallop.   Pulmonary:     Effort: Pulmonary effort is normal.     Breath sounds: No wheezing or rales.  Abdominal:     General: There is no distension.     Palpations: Abdomen is soft.     Tenderness: There is no abdominal tenderness.  Musculoskeletal:        General: No tenderness or edema.     Cervical back: Normal range of motion and neck supple.  Skin:    General: Skin is warm and dry.  Neurological:     Mental Status: She is alert and oriented to person, place, and time.     Comments: Subjective decrease sensation to light touch to the right side of her face.  Otherwise benign neurologic exam.  Able to ambulate without issue.  Psychiatric:        Mood and Affect: Mood and affect normal.        Behavior: Behavior normal.     ED Results / Procedures / Treatments   Labs (all labs ordered are listed,  but only abnormal results are displayed) Labs Reviewed  CBC - Abnormal; Notable for the following components:      Result Value   RBC 5.17 (*)    Hemoglobin 15.8 (*)    HCT 47.8 (*)    All other components within normal limits  COMPREHENSIVE METABOLIC PANEL - Abnormal; Notable for the following components:   Creatinine, Ser 1.03 (*)    GFR, Estimated 56 (*)    All other components within normal limits  ETHANOL  PROTIME-INR  APTT  DIFFERENTIAL  RAPID URINE DRUG SCREEN, HOSP PERFORMED  URINALYSIS, ROUTINE W REFLEX MICROSCOPIC    EKG EKG Interpretation  Date/Time:  Wednesday January 27 2021 08:37:32 EST Ventricular Rate:  78 PR Interval:    QRS Duration: 107 QT Interval:  411 QTC Calculation: 469 R Axis:   -46 Text Interpretation: Sinus rhythm Left anterior fascicular block RSR' in V1 or V2, right VCD or RVH Left ventricular hypertrophy No old tracing to compare Confirmed by Deno Etienne [phone]) on 01/27/2021 9:14:17 AM   Radiology CT HEAD WO CONTRAST  Result Date: 01/27/2021 CLINICAL DATA:  Woke up this morning with vision changes, confusion, right jaw numbness, and left hand numbness. Right parietal headache for the past 3 weeks. EXAM: CT HEAD WITHOUT CONTRAST TECHNIQUE: Contiguous axial images were obtained from the base of the skull through the vertex without intravenous contrast. COMPARISON:  None. FINDINGS: Brain: No evidence of acute infarction, hemorrhage, hydrocephalus, extra-axial collection or mass lesion/mass effect. Mild generalized cerebral atrophy. Scattered mild periventricular and subcortical white matter hypodensities are nonspecific, but favored to reflect chronic microvascular ischemic changes. Vascular: No hyperdense vessel or unexpected calcification. Skull: Normal. Negative for fracture or focal lesion. Sinuses/Orbits: No acute finding. Small air-fluid level in the left sphenoid sinus. Other: None. IMPRESSION: 1. No acute intracranial abnormality. 2. Mild cerebral  atrophy and chronic microvascular ischemic changes. Electronically Signed   By: Titus Dubin M.D.   On: 01/27/2021 09:26    Procedures Procedures   Medications Ordered in ED Medications  prochlorperazine (COMPAZINE) injection 5 mg (5 mg Intravenous Not Given 01/27/21 1029)  diphenhydrAMINE (BENADRYL) injection 12.5 mg (12.5 mg Intravenous Not Given 01/27/21 1030)  hydrocortisone sodium succinate (SOLU-CORTEF) injection 200 mg (has no administration in time range)  diphenhydrAMINE (BENADRYL) capsule 50 mg (has no administration in time range)    Or  diphenhydrAMINE (BENADRYL) injection 50 mg (has no administration in time range)    ED Course  I have reviewed the triage vital signs and the nursing notes.  Pertinent labs & imaging results that were available during my care of the patient were reviewed by me and considered in my medical decision making (see chart for details).    MDM Rules/Calculators/A&P                          80 yo F here with a chief complaints of a right-sided headache and blurred vision and facial numbness.  Occurred this morning and has resolved.  Headache is been going on for the past couple weeks.  She is also concerned because her blood pressure has been elevated.  Will obtain a CT scan of the head blood work discussed with neurology.  Discussed with Dr. Theda Sers, recommends CTA head and neck and MRI brain.  If negative likely d/c home.   I discussed the case with Dr. Regenia Skeeter who accepts the patient in transfer ED to ED.  Patient will go P[facility] to [facility].  She does have a contrast dye allergy.  Will order the CT scans here and attempt to give the steroid prior to her leaving.  The patients results and plan were reviewed and discussed.   Any x-rays performed were independently reviewed by myself.   Differential diagnosis were considered with the presenting HPI.  Medications  prochlorperazine (COMPAZINE) injection 5 mg (5 mg Intravenous Not Given 01/27/21 1029)   diphenhydrAMINE (BENADRYL) injection 12.5 mg (12.5 mg Intravenous Not Given 01/27/21 1030)  hydrocortisone sodium succinate (SOLU-CORTEF) injection 200 mg (has no administration in time range)  diphenhydrAMINE (  BENADRYL) capsule 50 mg (has no administration in time range)    Or  diphenhydrAMINE (BENADRYL) injection 50 mg (has no administration in time range)    Vitals:   01/27/21 0838 01/27/21 0838 01/27/21 0911 01/27/21 1030  BP:   (!) 181/93 (!) 185/96  Pulse: 75  77 73  Resp: 10  20 18   Temp:  97.8 F (36.6 C)    TempSrc:  Oral    SpO2: 99%  98% 98%  Weight:      Height:        Final diagnoses:  Right-sided headache  Vision changes  Facial numbness       Final Clinical Impression(s) / ED Diagnoses Final diagnoses:  Right-sided headache  Vision changes  Facial numbness    Rx / DC Orders ED Discharge Orders    None       Deno Etienne, DO 01/27/21 1132

## 2021-01-27 NOTE — ED Notes (Signed)
Pt on monitor and vitals cycling 

## 2021-01-27 NOTE — ED Notes (Signed)
Pt husband driving patient and given directions for how to get to [facility]. IV secured for transport.

## 2021-01-27 NOTE — Consult Note (Signed)
Neurology Consultation  Reason for Consult: Headache, blurry vision, facial numbness Referring Physician: Dr. Tyrone Nine  CC: Headache, blurry vision, facial numbness  History is obtained from: patient, EDP, chart review  HPI: Victoria Sharp is a 80 y.o. female with a medical history significant for hypertension, GERD, breast cancer, and rheumatoid arthritis on Humira who presented to [facility] today after experiencing vision changes, facial and arm tingling, and headache. She states that she woke this morning at 5:15 earlier than usual with a headache located at the top of her head and felt a little off balance. She states she carried on making her coffee when she experienced floaters and flashing lights with temporary double vision. Later she was attempting to change the channel on the television and states she is usually extremely proficient with technology but felt that her thoughts were clouded and she could not figure out how to change the channel. This prompted her to be evaluated. She claims the headache is persistent and has been present in varying severity for at least one month. With her associated vision changes, she experienced circumoral numbness and tingling right greater than left that she then felt in her left forearm that extended to her left fingers. The numbness, tingling, and vision changes lasted approximately about an hour before resolution.  She denies any prior hx of migraine. Reports that it is very rare for her to get headaches, no photo or phonophobia, no nausea, no vomiting.  Victoria Sharp states that during these events, she took her blood pressure and it was "sky high" with a diastolic blood pressure upwards if 115. She notes that she was on an ACE-inhibitor but because of a chronic cough was switched 2 months ago to losartan but states she does not believe that her blood pressure is as well controlled with this agent.   At the OSH, Victoria Sharp had a CT head without acute  intracranial abnormality but with mild cerebral atrophy and chronic microvascular ischemic changes and CT angio with mild atherosclerosis but without flow limiting proximal stenosis. She was then transferred to [facility] for further TIA work-up and evaluation and for MRI brain imaging.   ROS: A 14 point ROS was performed and is negative except as noted in the HPI.   Past Medical History:  Diagnosis Date  . Dermatitis   . History of breast cancer   . Hypertension   . Rheumatoid arthritis ([place])   . Tachycardia    Family History  Problem Relation Age of Onset  . Heart attack Father   . Kidney disease Brother   . Cancer Brother    Social History:   reports that she has never smoked. She has never used smokeless tobacco. She reports current alcohol use. She reports that she does not use drugs.  Medications No current facility-administered medications for this encounter.  Current Outpatient Medications:  .  Etanercept (ENBREL MINI) 50 MG/ML SOCT, Inject 50 mg into the skin once a week., Disp: 12 mL, Rfl: 0 .  losartan (COZAAR) 100 MG tablet, Take 100 mg by mouth daily., Disp: , Rfl:  .  pantoprazole (PROTONIX) 20 MG tablet, Take 20 mg by mouth daily., Disp: , Rfl:   Exam: Current vital signs: BP (!) 151/101   Pulse 94   Temp 97.7 F (36.5 C) (Oral)   Resp 16   Ht 5\' 2"  (1.575 m)   Wt 67.1 kg   SpO2 96%   BMI 27.07 kg/m  Vital signs in last 24 hours: Temp:  [  97.7 F (36.5 C)-97.8 F (36.6 C)] 97.7 F (36.5 C) (02/02 1231) Pulse Rate:  [70-94] 94 (02/02 1744) Resp:  [10-20] 16 (02/02 1744) BP: (150-186)/(81-105) 151/101 (02/02 1744) SpO2:  [95 %-100 %] 96 % (02/02 1744) Weight:  [67.1 kg] 67.1 kg (02/02 0834)  GENERAL: Awake, alert in no acute distress, laying in stretcher in hallway HEAD: - Normocephalic and atraumatic, dry mm EENT: Normal conjunctiva, no OP obstruction LUNGS - Normal respiratory effort. Non-labored breathing CV - extremities warm, well perfused ABDOMEN -  Soft, nontender, non-distended SKIN: within defined limits  NEURO:  Mental Status: alert and oriented to person, place, time, and situation. She is able to give a clear and coherent history.  Speech/Language: speech is clear without aphasia or dysarthria.  Naming, repetition, fluency, and comprehension intact. Cranial Nerves:  II: PERRL 3 mm/brisk. Visual fields full.  III, IV, VI: EOMI without ptosis. V: Face sensation intact and symmetric to light touch.  VII: Face is symmetric resting and smiling. Able to puff cheeks and raise eyebrows.  VIII: Hearing intact to voice IX, X: Phonation normal.  XI: Normal sternocleidomastoid and trapezius muscle strength XII: Tongue protrudes midline   Motor: 5/5 strength is all muscle groups without pronator drift. Antigravity movement present in all extremities.  Tone is normal. Bulk is normal.  Sensation- Intact to light touch bilaterally in all four extremities. Extinction intact.  Coordination: FTN intact bilaterally. HKS intact bilaterally. No pronator drift.  Gait- deferred  Labs I have reviewed labs in epic and the results pertinent to this consultation are: CBC    Component Value Date/Time   WBC 5.3 01/27/2021 0853   RBC 5.17 (H) 01/27/2021 0853   HGB 15.8 (H) 01/27/2021 0853   HCT 47.8 (H) 01/27/2021 0853   PLT 249 01/27/2021 0853   MCV 92.5 01/27/2021 0853   MCH 30.6 01/27/2021 0853   MCHC 33.1 01/27/2021 0853   RDW 12.4 01/27/2021 0853   LYMPHSABS 1.6 01/27/2021 0853   MONOABS 0.5 01/27/2021 0853   EOSABS 0.1 01/27/2021 0853   BASOSABS 0.0 01/27/2021 0853   CMP     Component Value Date/Time   NA 139 01/27/2021 0853   K 4.2 01/27/2021 0853   CL 103 01/27/2021 0853   CO2 27 01/27/2021 0853   GLUCOSE 92 01/27/2021 0853   BUN 23 01/27/2021 0853   CREATININE 1.03 (H) 01/27/2021 0853   CREATININE 0.95 (H) 01/14/2021 1055   CALCIUM 9.6 01/27/2021 0853   PROT 7.4 01/27/2021 0853   ALBUMIN 4.3 01/27/2021 0853   AST 25  01/27/2021 0853   ALT 15 01/27/2021 0853   ALKPHOS 65 01/27/2021 0853   BILITOT 0.6 01/27/2021 0853   GFRNONAA 56 (L) 01/27/2021 0853   GFRNONAA 58 (L) 01/14/2021 1055   GFRAA 67 01/14/2021 1055   Lipid Panel  No results found for: CHOL, TRIG, HDL, CHOLHDL, VLDL, LDLCALC, LDLDIRECT   No results found for: HGBA1C   Imaging I have reviewed the images obtained: CT-scan of the brain IMPRESSION: 1. No acute intracranial abnormality. 2. Mild cerebral atrophy and chronic microvascular ischemic changes.  CT angio head and neck IMPRESSION: 1. Mild atherosclerosis in the head and neck without a large vessel occlusion or flow limiting proximal stenosis. 2. 2 mm apical lung nodules. No follow-up needed if patient is low-risk (and has no known or suspected primary neoplasm). Non-contrast chest CT can be considered in 12 months if patient is high-risk. This recommendation follows the consensus statement: Guidelines for Management of Incidental  Pulmonary Nodules Detected on CT Images: From the Fleischner Society 2017; Radiology 2017; 284:228-243. 3. Aortic Atherosclerosis (ICD10-I70.0).  MRI Brain without contrast: No acute intracranial findings, chronic small vessel ischemia.  Impression:Victoria Sharp is a 80 y.o. female with hx of RA, HTN who presents with an episode of double vision with floaters in her eyes, followed by circumoral numbness, left hand numbness and tingling along with trouble working her TV remote. The episode lasted about an hour and resolved prior to coming to the ED. Endorses low grade headache at the vertex for the last month, no prior history of migraine.  Differential includes TIA vs complicated migraines. The episode seems to be somewhat consistent with a complicated migraine. She however never gets headache, never had anything like this in the past. It would be very unusual to have a new onset migraine in her age group. Given her risk factors including hx of  Rheumatoid arthritis and HTN along with notable small vessel ischemia on imaging, will do a full TIA workup.  Recommendations: Plan: - MRI Brain completed and negative for a stroke. - Vessel imaging with CT Angio completed and negative for an LVO. - Agree with TTE - Agree with Lipid panel with LDL - Please start statin if LDL > 70 - Recommend HbA1c - Antithrombotic - Aspirin 81mg  daily. - Recommend DVT ppx - SBP goal - permissive hypertension first 24 h < 220/110. Held home meds.  - Recommend Telemetry monitoring for arrythmia - Stroke education booklet - Recommend PT/OT/SLP consult - I ordered Magnesium Oxide 400mg  daily as a preventative medication for headache. - Recommend outpatient follow up with Neurology. Patient would like to follow up with a local neurologist closer to home.   Pt seen by NP/Neuro and later by MD. Note/plan to be edited by MD as needed.  Anibal Henderson, AGAC-NP [facility] Pager: [phone]  [place] Pager Number [phone]

## 2021-01-28 ENCOUNTER — Observation Stay (HOSPITAL_COMMUNITY): Payer: Medicare Other

## 2021-01-28 DIAGNOSIS — I10 Essential (primary) hypertension: Secondary | ICD-10-CM | POA: Diagnosis not present

## 2021-01-28 DIAGNOSIS — G459 Transient cerebral ischemic attack, unspecified: Secondary | ICD-10-CM | POA: Diagnosis not present

## 2021-01-28 LAB — LIPID PANEL
Cholesterol: 207 mg/dL — ABNORMAL HIGH (ref 0–200)
HDL: 84 mg/dL (ref >40)
LDL Cholesterol: 112 mg/dL — ABNORMAL HIGH (ref 0–99)
Total CHOL/HDL Ratio: 2.5 RATIO
Triglycerides: 57 mg/dL (ref <150)
VLDL: 11 mg/dL (ref 0–40)

## 2021-01-28 LAB — C-REACTIVE PROTEIN: CRP: <0.5 mg/dL (ref <1.0)

## 2021-01-28 LAB — SARS CORONAVIRUS 2 (TAT 6-24 HRS): SARS Coronavirus 2: NEGATIVE

## 2021-01-28 LAB — HEMOGLOBIN A1C
Hgb A1c MFr Bld: 5.3 % (ref 4.8–5.6)
Mean Plasma Glucose: 105.41 mg/dL

## 2021-01-28 LAB — SEDIMENTATION RATE: Sed Rate: 7 mm/hr (ref 0–22)

## 2021-01-28 MED ORDER — SENNOSIDES-DOCUSATE SODIUM 8.6-50 MG PO TABS: 1.0000 | ORAL_TABLET | Freq: Every evening | ORAL | Status: DC | PRN

## 2021-01-28 MED ORDER — STROKE: EARLY STAGES OF RECOVERY BOOK
Freq: Once | Status: DC
  Filled 2021-01-28: qty 1

## 2021-01-28 MED ORDER — ASPIRIN 81 MG PO CHEW
81.0000 mg | CHEWABLE_TABLET | Freq: Every day | ORAL | Status: DC
  Administered 2021-01-28: 81 mg via ORAL
  Filled 2021-01-28: qty 1

## 2021-01-28 MED ORDER — ACETAMINOPHEN 160 MG/5ML PO SOLN: 650.0000 mg | ORAL | Status: DC | PRN

## 2021-01-28 MED ORDER — ACETAMINOPHEN 325 MG PO TABS: 650.0000 mg | ORAL_TABLET | ORAL | Status: DC | PRN

## 2021-01-28 MED ORDER — MAGNESIUM OXIDE 400 (241.3 MG) MG PO TABS
400.0000 mg | ORAL_TABLET | Freq: Every day | ORAL | Status: DC
  Administered 2021-01-28: 400 mg via ORAL
  Filled 2021-01-28: qty 1

## 2021-01-28 MED ORDER — ACETAMINOPHEN 650 MG RE SUPP
650.0000 mg | RECTAL | Status: DC | PRN
Start: 2021-01-27 — End: 2021-01-28

## 2021-01-28 MED ORDER — ASPIRIN 81 MG PO CHEW: 81.0000 mg | CHEWABLE_TABLET | Freq: Every day | ORAL | 12 refills | Status: DC

## 2021-01-28 MED ORDER — TOPIRAMATE 25 MG PO TABS: 25.0000 mg | ORAL_TABLET | Freq: Two times a day (BID) | ORAL | 11 refills | Status: DC

## 2021-01-28 MED ORDER — LOSARTAN POTASSIUM 50 MG PO TABS
100.0000 mg | ORAL_TABLET | Freq: Every day | ORAL | Status: DC
  Administered 2021-01-28: 100 mg via ORAL
  Filled 2021-01-28: qty 2

## 2021-01-28 NOTE — ED Notes (Signed)
Breakfast Ordered 

## 2021-01-28 NOTE — Discharge Summary (Signed)
Physician Discharge Summary  Victoria Sharp CVE:[id number] DOB: Oct 26, 1941 DOA: 01/27/2021  PCP: [name]., MD  Admit date: 01/27/2021 Discharge date: 01/28/2021  Time spent: 32 minutes  Recommendations for Outpatient Follow-up:  1. Continue aspirin 2. Needs outpatient follow-up with PCP 1 to 2 weeks 3. 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, HTN, MVP, Barrett's esophagus with reflux, prior breast cancer, multiple visual issues, stage IIIa chronic kidney disease Admit from home headache facial numbness on and off-initially thought this was a migraine--blurred vision double vision also tingling in her arms and in her face Seen by neurologist and felt this was TIA versus complicated migraine CT head, MRI brain negative for stroke             Admitted as observation-echocardiogram was ordered but was canceled-autoimmune work-up was ordered by neurologist but they felt on their evaluation of the patient that patient probably had a complicated migraine which may have presented similar to TIA She was stabilized for discharge home after there consulted review and should follow-up in the outpatient setting with PCP and rheumatologist    Consultations:  Neurologist  Discharge Exam: Vitals:   01/28/21 1000 01/28/21 1104  BP: (!) 142/101 (!) 144/98  Pulse: 85 78  Resp: 13 16  Temp:    SpO2: 99% 98%    General: Alert coherent no distress EOMI NCAT moving all 4 limbs equally finger-nose-finger intact power  5/5 upper and lower extremities grip strength good Smile symmetric No facial asymmetry Uvula midline Shoulder shrug intact Sensory intact Abdomen soft nontender Chest clear S1-S2 no murmur No lower extremity edema  Discharge Instructions    Allergies as of 01/28/2021      Reactions   Contrast Media [iodinated Diagnostic Agents] Hives   Latex Other (See Comments)   Ask pt and enter   Leflunomide    Methotrexate Derivatives    Metronidazole Nausea And Vomiting, Other (See Comments)   Ask pt and enter   Sulfamethoxazole-trimethoprim Nausea And Vomiting, Other (See Comments)   Ask pt and enter      Medication List    TAKE these medications   aspirin 81 MG chewable tablet Chew 1 tablet (81 mg total) by mouth daily. Start taking on: January 29, 2021   Enbrel Mini 50 MG/ML Soct Generic drug: Etanercept Inject 50 mg into the skin once a week. What changed: when to take this   losartan 100 MG tablet Commonly known as: COZAAR Take 100 mg by mouth daily.   pantoprazole 20 MG tablet Commonly known as: PROTONIX Take 20 mg by mouth daily.   topiramate 25 MG tablet Commonly known as: Topamax Take 1 tablet (25 mg total) by mouth 2 (two) times daily.      Allergies  Allergen Reactions   Contrast Media [Iodinated Diagnostic Agents] Hives   Latex Other (See Comments)    Ask pt and enter   Leflunomide    Methotrexate Derivatives    Metronidazole Nausea And Vomiting and Other (See Comments)    Ask pt and enter    Sulfamethoxazole-Trimethoprim Nausea And Vomiting  and Other (See Comments)    Ask pt and enter     Follow-up Information    Go to  [facility].   Contact information: [place] [id number] [phone]               The results of significant diagnostics from this hospitalization (including imaging, microbiology, ancillary and laboratory) are listed below for reference.    Significant  Diagnostic Studies: CT Angio Head W or Wo Contrast  Result Date: 01/27/2021 CLINICAL DATA:  TIA. Blurry vision, facial numbness, and right-sided headache. EXAM: CT ANGIOGRAPHY HEAD AND NECK TECHNIQUE: Multidetector CT imaging of the head and neck was performed using the standard protocol during bolus administration of intravenous contrast. Multiplanar CT image reconstructions and MIPs were obtained to evaluate the vascular anatomy. Carotid stenosis measurements (when applicable) are obtained utilizing NASCET criteria, using the distal internal carotid diameter as the denominator. CONTRAST:  20mL OMNIPAQUE IOHEXOL 350 MG/ML SOLN COMPARISON:  None. FINDINGS: CTA NECK FINDINGS Aortic arch: Standard 3 vessel aortic arch with minimal atherosclerotic plaque. Widely patent arch vessel origins. Right carotid system: Patent with minimal atherosclerotic plaque at the carotid bifurcation. No evidence of stenosis or dissection. Tortuous proximal common carotid artery. Left carotid system: Patent with minimal atherosclerotic plaque at the carotid bifurcation. No evidence of stenosis or dissection. Vertebral arteries: Patent without evidence of stenosis or dissection. Dominant left vertebral artery. Skeleton: Moderate lower cervical disc degeneration. Severe left facet arthrosis at C7-T1 with trace anterolisthesis. Other neck: No evidence of cervical lymphadenopathy or mass. Upper chest: Small nodules in both lung apices measuring 2 mm. No apical lung consolidation or mass. Review of the MIP images confirms the above findings CTA HEAD FINDINGS Anterior circulation: The internal carotid arteries are widely patent from skull base to carotid termini. ACAs and MCAs are patent without evidence of proximal branch occlusion or significant proximal stenosis. No aneurysm is identified. Posterior circulation: The intracranial vertebral arteries are patent with the right being extremely diminutive distal to the PICA origin. There is  atherosclerotic irregularity of the left V4 segment with at most mild stenosis. The right PICA and left AICA appear dominant. Patent SCAs are seen bilaterally. The basilar artery is widely patent. There are fetal origins of both PCAs. No significant proximal PCA stenosis is evident. No aneurysm is identified. Venous sinuses: As permitted by contrast timing, patent. Anatomic variants: Fetal PCAs. Review of the MIP images confirms the above findings IMPRESSION: 1. Mild atherosclerosis in the head and neck without a large vessel occlusion or flow limiting proximal stenosis. 2. 2 mm apical lung nodules. No follow-up needed if patient is low-risk (and has no known or suspected primary neoplasm). Non-contrast chest CT can be considered in 12 months if patient is high-risk. This recommendation follows the consensus statement: Guidelines for Management of Incidental Pulmonary Nodules Detected on CT Images: From the Fleischner Society 2017; Radiology 2017; 284:228-243. 3. Aortic Atherosclerosis (ICD10-I70.0). Electronically Signed   By: Logan Bores M.D.   On: 01/27/2021 18:11   CT HEAD WO CONTRAST  Result Date: 01/27/2021 CLINICAL DATA:  Woke up this morning with vision changes, confusion, right jaw numbness, and left hand numbness. Right parietal headache for the past 3 weeks. EXAM: CT HEAD WITHOUT CONTRAST TECHNIQUE: Contiguous axial images were obtained from the base of the skull through the vertex without intravenous contrast. COMPARISON:  None. FINDINGS: Brain: No evidence of acute infarction, hemorrhage, hydrocephalus, extra-axial collection or mass lesion/mass effect. Mild generalized  cerebral atrophy. Scattered mild periventricular and subcortical white matter hypodensities are nonspecific, but favored to reflect chronic microvascular ischemic changes. Vascular: No hyperdense vessel or unexpected calcification. Skull: Normal. Negative for fracture or focal lesion. Sinuses/Orbits: No acute finding. Small air-fluid  level in the left sphenoid sinus. Other: None. IMPRESSION: 1. No acute intracranial abnormality. 2. Mild cerebral atrophy and chronic microvascular ischemic changes. Electronically Signed   By: Titus Dubin M.D.   On: 01/27/2021 09:26   CT Angio Neck W and/or Wo Contrast  Result Date: 01/27/2021 CLINICAL DATA:  TIA. Blurry vision, facial numbness, and right-sided headache. EXAM: CT ANGIOGRAPHY HEAD AND NECK TECHNIQUE: Multidetector CT imaging of the head and neck was performed using the standard protocol during bolus administration of intravenous contrast. Multiplanar CT image reconstructions and MIPs were obtained to evaluate the vascular anatomy. Carotid stenosis measurements (when applicable) are obtained utilizing NASCET criteria, using the distal internal carotid diameter as the denominator. CONTRAST:  50mL OMNIPAQUE IOHEXOL 350 MG/ML SOLN COMPARISON:  None. FINDINGS: CTA NECK FINDINGS Aortic arch: Standard 3 vessel aortic arch with minimal atherosclerotic plaque. Widely patent arch vessel origins. Right carotid system: Patent with minimal atherosclerotic plaque at the carotid bifurcation. No evidence of stenosis or dissection. Tortuous proximal common carotid artery. Left carotid system: Patent with minimal atherosclerotic plaque at the carotid bifurcation. No evidence of stenosis or dissection. Vertebral arteries: Patent without evidence of stenosis or dissection. Dominant left vertebral artery. Skeleton: Moderate lower cervical disc degeneration. Severe left facet arthrosis at C7-T1 with trace anterolisthesis. Other neck: No evidence of cervical lymphadenopathy or mass. Upper chest: Small nodules in both lung apices measuring 2 mm. No apical lung consolidation or mass. Review of the MIP images confirms the above findings CTA HEAD FINDINGS Anterior circulation: The internal carotid arteries are widely patent from skull base to carotid termini. ACAs and MCAs are patent without evidence of proximal branch  occlusion or significant proximal stenosis. No aneurysm is identified. Posterior circulation: The intracranial vertebral arteries are patent with the right being extremely diminutive distal to the PICA origin. There is atherosclerotic irregularity of the left V4 segment with at most mild stenosis. The right PICA and left AICA appear dominant. Patent SCAs are seen bilaterally. The basilar artery is widely patent. There are fetal origins of both PCAs. No significant proximal PCA stenosis is evident. No aneurysm is identified. Venous sinuses: As permitted by contrast timing, patent. Anatomic variants: Fetal PCAs. Review of the MIP images confirms the above findings IMPRESSION: 1. Mild atherosclerosis in the head and neck without a large vessel occlusion or flow limiting proximal stenosis. 2. 2 mm apical lung nodules. No follow-up needed if patient is low-risk (and has no known or suspected primary neoplasm). Non-contrast chest CT can be considered in 12 months if patient is high-risk. This recommendation follows the consensus statement: Guidelines for Management of Incidental Pulmonary Nodules Detected on CT Images: From the Fleischner Society 2017; Radiology 2017; 284:228-243. 3. Aortic Atherosclerosis (ICD10-I70.0). Electronically Signed   By: Logan Bores M.D.   On: 01/27/2021 18:11   MR BRAIN WO CONTRAST  Result Date: 01/27/2021 CLINICAL DATA:  Transient ischemic attack EXAM: MRI HEAD WITHOUT CONTRAST TECHNIQUE: Multiplanar, multiecho pulse sequences of the brain and surrounding structures were obtained without intravenous contrast. COMPARISON:  None. FINDINGS: Brain: No acute infarct, mass effect or extra-axial collection. No acute or chronic hemorrhage. There is multifocal hyperintense T2-weighted signal within the white matter. Parenchymal volume and CSF spaces are normal. The midline structures are normal. Vascular:  Major flow voids are preserved. Skull and upper cervical spine: Normal calvarium and skull  base. Visualized upper cervical spine and soft tissues are normal. Sinuses/Orbits:No paranasal sinus fluid levels or advanced mucosal thickening. No mastoid or middle ear effusion. Normal orbits. IMPRESSION: 1. No acute intracranial abnormality. 2. Findings of chronic small vessel ischemia. Electronically Signed   By: Ulyses Jarred M.D.   On: 01/27/2021 20:37    Microbiology: Recent Results (from the past 240 hour(s))  SARS CORONAVIRUS 2 (TAT 6-24 HRS) Nasopharyngeal Nasopharyngeal Swab     Status: None   Collection Time: 01/28/21  5:19 AM   Specimen: Nasopharyngeal Swab  Result Value Ref Range Status   SARS Coronavirus 2 NEGATIVE NEGATIVE Final    Comment: (NOTE) SARS-CoV-2 target nucleic acids are NOT DETECTED.  The SARS-CoV-2 RNA is generally detectable in upper and lower respiratory specimens during the acute phase of infection. Negative results do not preclude SARS-CoV-2 infection, do not rule out co-infections with other pathogens, and should not be used as the sole basis for treatment or other patient management decisions. Negative results must be combined with clinical observations, patient history, and epidemiological information. The expected result is Negative.  Fact Sheet for Patients: SugarRoll.be  Fact Sheet for Healthcare Providers: https://www.woods-mathews.com/  This test is not yet approved or cleared by the [place] FDA and  has been authorized for detection and/or diagnosis of SARS-CoV-2 by FDA under an Emergency Use Authorization (EUA). This EUA will remain  in effect (meaning this test can be used) for the duration of the COVID-19 declaration under Se ction 564(b)(1) of the Act, 21 U.S.C. section 360bbb-3(b)(1), unless the authorization is terminated or revoked sooner.  Performed at [facility], [place] [address], [place], [place] [address]      Labs: Basic Metabolic Panel: Recent Labs  Lab  01/27/21 0853  NA 139  K 4.2  CL 103  CO2 27  GLUCOSE 92  BUN 23  CREATININE 1.03*  CALCIUM 9.6   Liver Function Tests: Recent Labs  Lab 01/27/21 0853  AST 25  ALT 15  ALKPHOS 65  BILITOT 0.6  PROT 7.4  ALBUMIN 4.3   No results for input(s): LIPASE, AMYLASE in the last 168 hours. No results for input(s): AMMONIA in the last 168 hours. CBC: Recent Labs  Lab 01/27/21 0853  WBC 5.3  NEUTROABS 3.1  HGB 15.8*  HCT 47.8*  MCV 92.5  PLT 249   Cardiac Enzymes: No results for input(s): CKTOTAL, CKMB, CKMBINDEX, TROPONINI in the last 168 hours. BNP: BNP (last 3 results) No results for input(s): BNP in the last 8760 hours.  ProBNP (last 3 results) No results for input(s): PROBNP in the last 8760 hours.  CBG: No results for input(s): GLUCAP in the last 168 hours.     Signed:  Nita Sells MD   [facility] 01/28/2021, 11:58 AM

## 2021-01-28 NOTE — Progress Notes (Signed)
STROKE TEAM PROGRESS NOTE   INTERVAL HISTORY No acute event since arrival   Patient is sitting up on ED stretcher in NAD. Calm, conversant and engages easily in conversation,able to provide history. She reports mild headaches x years, occasionally with visual "flashes" and blurred vision. Then about a month or so ago she had a headache which she woke up with that she describes as worse in intensity primarily in the tempo parietal region which at times left her scalp sore to touch. She does not recall any n/v, light or sound sensitivity with any of her headaches. She denies loss of vision or diplopia. She denies pain with chewing.   Then yesterday she became alarmed when the headache included facial and left forearm tingling and presented to ED for further evaluation. She had a headache early this morning that was 5/10 and located in the frontal region that has now resolved. She denies vision changes, numbness, tingling or weakness currently. Vitals:   01/28/21 0530 01/28/21 0545 01/28/21 0736 01/28/21 0745  BP: 124/90 138/82 (!) 149/84 (!) 142/88  Pulse: 76 72 82 79  Resp: 15  19 14   Temp:    99 F (37.2 C)  TempSrc:    Oral  SpO2: 97% 97% 99% 97%  Weight:      Height:       CBC:  Recent Labs  Lab 01/27/21 0853  WBC 5.3  NEUTROABS 3.1  HGB 15.8*  HCT 47.8*  MCV 92.5  PLT 0000000   Basic Metabolic Panel:  Recent Labs  Lab 01/27/21 0853  NA 139  K 4.2  CL 103  CO2 27  GLUCOSE 92  BUN 23  CREATININE 1.03*  CALCIUM 9.6   Lipid Panel:  Recent Labs  Lab 01/28/21 0529  CHOL 207*  TRIG 57  HDL 84  CHOLHDL 2.5  VLDL 11  LDLCALC 112*   HgbA1c:  Recent Labs  Lab 01/28/21 0529  HGBA1C 5.3   Urine Drug Screen:  Recent Labs  Lab 01/27/21 0853  LABOPIA NONE DETECTED  COCAINSCRNUR NONE DETECTED  LABBENZ NONE DETECTED  AMPHETMU NONE DETECTED  THCU NONE DETECTED  LABBARB NONE DETECTED    Alcohol Level  Recent Labs  Lab 01/27/21 0853  ETH <10    IMAGING past  24 hours CT Angio Head W or Wo Contrast  Result Date: 01/27/2021 CLINICAL DATA:  TIA. Blurry vision, facial numbness, and right-sided headache. EXAM: CT ANGIOGRAPHY HEAD AND NECK TECHNIQUE: Multidetector CT imaging of the head and neck was performed using the standard protocol during bolus administration of intravenous contrast. Multiplanar CT image reconstructions and MIPs were obtained to evaluate the vascular anatomy. Carotid stenosis measurements (when applicable) are obtained utilizing NASCET criteria, using the distal internal carotid diameter as the denominator. CONTRAST:  61mL OMNIPAQUE IOHEXOL 350 MG/ML SOLN COMPARISON:  None. FINDINGS: CTA NECK FINDINGS Aortic arch: Standard 3 vessel aortic arch with minimal atherosclerotic plaque. Widely patent arch vessel origins. Right carotid system: Patent with minimal atherosclerotic plaque at the carotid bifurcation. No evidence of stenosis or dissection. Tortuous proximal common carotid artery. Left carotid system: Patent with minimal atherosclerotic plaque at the carotid bifurcation. No evidence of stenosis or dissection. Vertebral arteries: Patent without evidence of stenosis or dissection. Dominant left vertebral artery. Skeleton: Moderate lower cervical disc degeneration. Severe left facet arthrosis at C7-T1 with trace anterolisthesis. Other neck: No evidence of cervical lymphadenopathy or mass. Upper chest: Small nodules in both lung apices measuring 2 mm. No apical lung consolidation or mass.  Review of the MIP images confirms the above findings CTA HEAD FINDINGS Anterior circulation: The internal carotid arteries are widely patent from skull base to carotid termini. ACAs and MCAs are patent without evidence of proximal branch occlusion or significant proximal stenosis. No aneurysm is identified. Posterior circulation: The intracranial vertebral arteries are patent with the right being extremely diminutive distal to the PICA origin. There is atherosclerotic  irregularity of the left V4 segment with at most mild stenosis. The right PICA and left AICA appear dominant. Patent SCAs are seen bilaterally. The basilar artery is widely patent. There are fetal origins of both PCAs. No significant proximal PCA stenosis is evident. No aneurysm is identified. Venous sinuses: As permitted by contrast timing, patent. Anatomic variants: Fetal PCAs. Review of the MIP images confirms the above findings IMPRESSION: 1. Mild atherosclerosis in the head and neck without a large vessel occlusion or flow limiting proximal stenosis. 2. 2 mm apical lung nodules. No follow-up needed if patient is low-risk (and has no known or suspected primary neoplasm). Non-contrast chest CT can be considered in 12 months if patient is high-risk. This recommendation follows the consensus statement: Guidelines for Management of Incidental Pulmonary Nodules Detected on CT Images: From the Fleischner Society 2017; Radiology 2017; 284:228-243. 3. Aortic Atherosclerosis (ICD10-I70.0). Electronically Signed   By: Logan Bores M.D.   On: 01/27/2021 18:11   CT Angio Neck W and/or Wo Contrast  Result Date: 01/27/2021 CLINICAL DATA:  TIA. Blurry vision, facial numbness, and right-sided headache. EXAM: CT ANGIOGRAPHY HEAD AND NECK TECHNIQUE: Multidetector CT imaging of the head and neck was performed using the standard protocol during bolus administration of intravenous contrast. Multiplanar CT image reconstructions and MIPs were obtained to evaluate the vascular anatomy. Carotid stenosis measurements (when applicable) are obtained utilizing NASCET criteria, using the distal internal carotid diameter as the denominator. CONTRAST:  32mL OMNIPAQUE IOHEXOL 350 MG/ML SOLN COMPARISON:  None. FINDINGS: CTA NECK FINDINGS Aortic arch: Standard 3 vessel aortic arch with minimal atherosclerotic plaque. Widely patent arch vessel origins. Right carotid system: Patent with minimal atherosclerotic plaque at the carotid bifurcation.  No evidence of stenosis or dissection. Tortuous proximal common carotid artery. Left carotid system: Patent with minimal atherosclerotic plaque at the carotid bifurcation. No evidence of stenosis or dissection. Vertebral arteries: Patent without evidence of stenosis or dissection. Dominant left vertebral artery. Skeleton: Moderate lower cervical disc degeneration. Severe left facet arthrosis at C7-T1 with trace anterolisthesis. Other neck: No evidence of cervical lymphadenopathy or mass. Upper chest: Small nodules in both lung apices measuring 2 mm. No apical lung consolidation or mass. Review of the MIP images confirms the above findings CTA HEAD FINDINGS Anterior circulation: The internal carotid arteries are widely patent from skull base to carotid termini. ACAs and MCAs are patent without evidence of proximal branch occlusion or significant proximal stenosis. No aneurysm is identified. Posterior circulation: The intracranial vertebral arteries are patent with the right being extremely diminutive distal to the PICA origin. There is atherosclerotic irregularity of the left V4 segment with at most mild stenosis. The right PICA and left AICA appear dominant. Patent SCAs are seen bilaterally. The basilar artery is widely patent. There are fetal origins of both PCAs. No significant proximal PCA stenosis is evident. No aneurysm is identified. Venous sinuses: As permitted by contrast timing, patent. Anatomic variants: Fetal PCAs. Review of the MIP images confirms the above findings IMPRESSION: 1. Mild atherosclerosis in the head and neck without a large vessel occlusion or flow limiting proximal stenosis.  2. 2 mm apical lung nodules. No follow-up needed if patient is low-risk (and has no known or suspected primary neoplasm). Non-contrast chest CT can be considered in 12 months if patient is high-risk. This recommendation follows the consensus statement: Guidelines for Management of Incidental Pulmonary Nodules Detected  on CT Images: From the Fleischner Society 2017; Radiology 2017; 284:228-243. 3. Aortic Atherosclerosis (ICD10-I70.0). Electronically Signed   By: Logan Bores M.D.   On: 01/27/2021 18:11   MR BRAIN WO CONTRAST  Result Date: 01/27/2021 CLINICAL DATA:  Transient ischemic attack EXAM: MRI HEAD WITHOUT CONTRAST TECHNIQUE: Multiplanar, multiecho pulse sequences of the brain and surrounding structures were obtained without intravenous contrast. COMPARISON:  None. FINDINGS: Brain: No acute infarct, mass effect or extra-axial collection. No acute or chronic hemorrhage. There is multifocal hyperintense T2-weighted signal within the white matter. Parenchymal volume and CSF spaces are normal. The midline structures are normal. Vascular: Major flow voids are preserved. Skull and upper cervical spine: Normal calvarium and skull base. Visualized upper cervical spine and soft tissues are normal. Sinuses/Orbits:No paranasal sinus fluid levels or advanced mucosal thickening. No mastoid or middle ear effusion. Normal orbits. IMPRESSION: 1. No acute intracranial abnormality. 2. Findings of chronic small vessel ischemia. Electronically Signed   By: Ulyses Jarred M.D.   On: 01/27/2021 20:37    PHYSICAL EXAM GENERAL: Awake, alert in no acute distress, laying in stretcher in hallway HEAD: - Normocephalic and atraumatic, dry mm EENT: Normal conjunctiva, no OP obstruction LUNGS - Normal respiratory effort. Non-labored breathing CV - extremities warm, well perfused ABDOMEN - Soft, nontender, non-distended SKIN: within defined limits  NEURO:  Mental Status: alert and oriented to person, place, time, and situation. She is able to give a clear and coherent history.  Speech/Language: speech is clear without aphasia or dysarthria.  Naming, repetition, fluency, and comprehension intact. Cranial Nerves:  II: PERRL 3 mm/brisk. Visual fields full.  III, IV, VI: EOMI without ptosis. V: Face sensation intact and symmetric to light  touch.  VII: Face is symmetric resting and smiling. Able to puff cheeks and raise eyebrows.  VIII: Hearing intact to voice IX, X: Phonation normal.  XI: Normal sternocleidomastoid and trapezius muscle strength XII: Tongue protrudes midline   Motor: 5/5 strength is all muscle groups without pronator drift. Antigravity movement present in all extremities.  Tone is normal. Bulk is normal.  Sensation- Intact to light touch bilaterally in all four extremities. Extinction intact.  Coordination: FTN intact bilaterally. HKS intact bilaterally. No pronator drift.  Gait- deferred  ASSESSMENT/PLAN Mrs. Judah Devoll is a 80 y.o. female with hx of RA, HTN who presents with an episode of double vision with floaters in her eyes, followed by circumoral numbness, left hand numbness and tingling along with trouble working her TV remote. The episode lasted about an hour and resolved prior to coming to the ED. Endorses low grade headache at the vertex for the last month, no prior history of migraine documented but reports history of headaches for several years nonetheless.   TIA vs. Complicated migrainous type headache episode with less likely inflammatory process such as GCA   Code Stroke No acute abnormality  CTA head & neck:Mild atherosclerosis in the head and neck without a large vessel occlusion or flow limiting proximal stenosis. 2. 2 mm apical lung nodules  MRI : No acute abnormality   LDL 112  HgbA1c 5.3    Diet   Diet Heart Room service appropriate? Yes; Fluid consistency: Thin    Plan for  ASA 81mg  daily    Topamax 25mg  BID for headaches   Therapy recommendations:  Cleared for home   Disposition:  Home   Topamax 25mg  BID initiated for headaches  Inflammatory marker labs are pending  4-6 week follow up recommended with patient asking to arrange follow up with her husbands [place] neurologist. Follow up with Dr. Leonie Man request also sent as back up plan per discussion with  patient.   Hypertension  Home meds:  Cozaar 100 mg daily  Hyperlipidemia  Home meds:  None  LDL 112, goal < 70  Other Stroke Risk Factors  Advanced Age >/= 65   ETOH use, alcohol level <10, advised to drink no more than 1-2 drink(s) a day  Hospital day # 0  I have personally obtained history,examined this patient, reviewed notes, independently viewed imaging studies, participated in medical decision making and plan of care.ROS completed by me personally and pertinent positives fully documented  I have made any additions or clarifications directly to the above note. Agree with note above.  Patient presented with 1 month of increasing headaches with some visual symptoms and transient episode of perioral and bilateral hand paresthesias likely complicated migraine episode given negative brain imaging studies.  Even though she denied prior history of migraines inquiry she admits to intermittent headaches which do have migrainous character.  Recommend Topamax 25 mg twice daily for migraine prevention and aspirin 81 mg daily for stroke and cardiac prevention.  Follow-up as an outpatient with stroke clinic in 6 weeks.  Long discussion with patient and with Dr. Verlon Au.  Greater than 50% time in the 35-minute visit was spent on counseling and coordination of care about her symptoms and discussion of differential diagnosis of TIA versus complicated migraine answering questions  Antony Contras, MD Medical Director [facility] Pager: [phone] 01/28/2021 4:51 PM   To contact Stroke Continuity provider, please refer to [email or web address] After hours, contact General Neurology

## 2021-01-28 NOTE — Discharge Instructions (Signed)
Please make an appointment with your neurologist close to home you mentioned or return to see the Stroke Neurologist, Dr. Leonie Man, within 4 weeks.

## 2021-01-28 NOTE — H&P (Signed)
History and Physical    Victoria Sharp [id number] DOB: 1941-05-08 DOA: 01/27/2021  PCP: [name]., MD   Patient coming from: Home  Chief Complaint:   Numbness and tingling of face with visual changes  HPI: Victoria Sharp is a 80 y.o. female with medical history significant for HTN, RA who presents with complaint of chief complaint of blurry vision, facial numbness and a right-sided headache.  Patient has had a right-sided headache for the past few weeks. Woke up this morning and felt like something was not right and when she turned the lights on to look into the mirror she realized that her vision was somewhat blurry.  She states she had "lights floating in her vision" as well. She thinks it was worse on the right than the left. She reports she felt like the right side of her face was weak and had a tingling sensation in it.  She denied arm or leg weakness, She denies difficulty with speech or swallowing.  She did feel a little confused when the symptoms were noticied.  Her vision improved after 10-15 minutes but the tingling continued for another 10 minutes then resolved. She states she also had some tingling in her left hand. All of her symptoms resolved within 30 minutes. She went to [facility] and was sent to [facility] ER for neurology evaluation and MRI brain. Checked her blood pressure at home and was concerned because it was elevated. She states that her BP medication was changed from lisinopril to losartan two months ago and has been having continued elevated BP over that time.   ED Course: MRI brain was obtained and was negative. CTA head and neck showed no large vessel occulsions. Neurology evaluated pt and recommendation hospitalist to evaluate and keep pt in hospital for completion of TIA workup.   Review of Systems:  General: Denies fever, chills, weight loss, night sweats.  Denies dizziness.  Denies change in appetite HENT: Denies head trauma, headache, denies change in  hearing, tinnitus.  Denies nasal congestion or bleeding.  Denies sore throat, sores in mouth.  Denies difficulty swallowing Eyes: Reports floaters in vision this am. Denies blurry vision, pain in eye, drainage.  Denies discoloration of eyes. Neck: Denies pain.  Denies swelling.  Denies pain with movement. Cardiovascular: Denies chest pain, palpitations.  Denies edema.  Denies orthopnea Respiratory: Denies shortness of breath, cough.  Denies wheezing.  Denies sputum production Gastrointestinal: Denies abdominal pain, swelling.  Denies nausea, vomiting, diarrhea.  Denies melena.  Denies hematemesis. Musculoskeletal: Denies limitation of movement.  Denies deformity or swelling.  Denies pain.  Denies arthralgias or myalgias. Genitourinary: Denies pelvic pain.  Denies urinary frequency or hesitancy.  Denies dysuria.  Skin: Denies rash.  Denies petechiae, purpura, ecchymosis. Neurological: Denies headache. Denies syncope. Denies seizure activity. Denies slurred speech, drooping face.  Psychiatric: Denies depression, anxiety.  Denies suicidal thoughts or ideation.  Denies hallucinations.  Past Medical History:  Diagnosis Date  . Dermatitis   . History of breast cancer   . Hypertension   . Rheumatoid arthritis ([place])   . Tachycardia     Past Surgical History:  Procedure Laterality Date  . ABDOMINAL HYSTERECTOMY    . BREAST LUMPECTOMY Left 1998  . NEUROMA SURGERY Right 03/2020  . WEDGE RESECTION      Social History  reports that she has never smoked. She has never used smokeless tobacco. She reports current alcohol use. She reports that she does not use drugs.  Allergies  Allergen Reactions  . Contrast Media [Iodinated Diagnostic Agents] Hives  . Latex Other (See Comments)    Ask pt and enter  . Leflunomide   . Methotrexate Derivatives   . Metronidazole Nausea And Vomiting and Other (See Comments)    Ask pt and enter   . Sulfamethoxazole-Trimethoprim Nausea And Vomiting and Other (See  Comments)    Ask pt and enter     Family History  Problem Relation Age of Onset  . Heart attack Father   . Kidney disease Brother   . Cancer Brother      Prior to Admission medications   Medication Sig Start Date End Date Taking? Authorizing Provider  Etanercept (ENBREL MINI) 50 MG/ML SOCT Inject 50 mg into the skin once a week. 09/24/20   Bo Merino, MD  losartan (COZAAR) 100 MG tablet Take 100 mg by mouth daily. 10/29/20   [provider]  pantoprazole (PROTONIX) 20 MG tablet Take 20 mg by mouth daily. 07/05/17   [provider]    Physical Exam: Vitals:   01/27/21 1231 01/27/21 1510 01/27/21 1744 01/27/21 2159  BP: (!) 161/96 (!) 150/105 (!) 151/101 (!) 148/89  Pulse: 79 88 94 92  Resp: 20 18 16 20   Temp: 97.7 F (36.5 C)   98.1 F (36.7 C)  TempSrc: Oral   Oral  SpO2: 99% 95% 96% 98%  Weight:      Height:        Constitutional: NAD, calm, comfortable Vitals:   01/27/21 1231 01/27/21 1510 01/27/21 1744 01/27/21 2159  BP: (!) 161/96 (!) 150/105 (!) 151/101 (!) 148/89  Pulse: 79 88 94 92  Resp: 20 18 16 20   Temp: 97.7 F (36.5 C)   98.1 F (36.7 C)  TempSrc: Oral   Oral  SpO2: 99% 95% 96% 98%  Weight:      Height:       General: WDWN, Alert and oriented x3.  Eyes: EOMI, PERRL, conjunctivae normal.  Sclera nonicteric HENT:  [place]/AT, external ears normal.  Nares patent without epistasis.  Mucous membranes are moist. Posterior pharynx clear of any exudate or lesions Neck: Soft, normal range of motion, supple, no masses, no thyromegaly.  Trachea midline Respiratory: clear to auscultation bilaterally, no wheezing, no crackles. Normal respiratory effort. No accessory muscle use.  Cardiovascular: Regular rate and rhythm, no murmurs / rubs / gallops. No extremity edema. 2+ pedal pulses. No carotid bruits.  Abdomen: Soft, no tenderness, nondistended, no rebound or guarding.  No masses palpated. Bowel sounds normoactive Musculoskeletal: FROM. no  clubbing / cyanosis. Normal muscle tone.  Skin: Warm, dry, intact no rashes, lesions, ulcers. No induration Neurologic: CN 2-12 grossly intact.  Normal speech.  Sensation intact, patella DTR +1 bilaterally. Strength 5/5 in all extremities.  Tongue with normal extension without deviation. Psychiatric: Normal judgment and insight.  Normal mood.    Labs on Admission: I have personally reviewed following labs and imaging studies  CBC: Recent Labs  Lab 01/27/21 0853  WBC 5.3  NEUTROABS 3.1  HGB 15.8*  HCT 47.8*  MCV 92.5  PLT 622    Basic Metabolic Panel: Recent Labs  Lab 01/27/21 0853  NA 139  K 4.2  CL 103  CO2 27  GLUCOSE 92  BUN 23  CREATININE 1.03*  CALCIUM 9.6    GFR: Estimated Creatinine Clearance: 41.1 mL/min (A) (by C-G formula based on SCr of 1.03 mg/dL (H)).  Liver Function Tests: Recent Labs  Lab 01/27/21 0853  AST 25  ALT 15  ALKPHOS 65  BILITOT 0.6  PROT 7.4  ALBUMIN 4.3    Urine analysis:    Component Value Date/Time   COLORURINE YELLOW 01/27/2021 [place] 01/27/2021 1204   LABSPEC 1.010 01/27/2021 1204   PHURINE 6.0 01/27/2021 1204   GLUCOSEU NEGATIVE 01/27/2021 1204   HGBUR NEGATIVE 01/27/2021 1204   BILIRUBINUR NEGATIVE 01/27/2021 [place] 01/27/2021 1204   PROTEINUR NEGATIVE 01/27/2021 1204   NITRITE NEGATIVE 01/27/2021 1204   LEUKOCYTESUR NEGATIVE 01/27/2021 1204    Radiological Exams on Admission: CT Angio Head W or Wo Contrast  Result Date: 01/27/2021 CLINICAL DATA:  TIA. Blurry vision, facial numbness, and right-sided headache. EXAM: CT ANGIOGRAPHY HEAD AND NECK TECHNIQUE: Multidetector CT imaging of the head and neck was performed using the standard protocol during bolus administration of intravenous contrast. Multiplanar CT image reconstructions and MIPs were obtained to evaluate the vascular anatomy. Carotid stenosis measurements (when applicable) are obtained utilizing NASCET criteria, using the  distal internal carotid diameter as the denominator. CONTRAST:  14mL OMNIPAQUE IOHEXOL 350 MG/ML SOLN COMPARISON:  None. FINDINGS: CTA NECK FINDINGS Aortic arch: Standard 3 vessel aortic arch with minimal atherosclerotic plaque. Widely patent arch vessel origins. Right carotid system: Patent with minimal atherosclerotic plaque at the carotid bifurcation. No evidence of stenosis or dissection. Tortuous proximal common carotid artery. Left carotid system: Patent with minimal atherosclerotic plaque at the carotid bifurcation. No evidence of stenosis or dissection. Vertebral arteries: Patent without evidence of stenosis or dissection. Dominant left vertebral artery. Skeleton: Moderate lower cervical disc degeneration. Severe left facet arthrosis at C7-T1 with trace anterolisthesis. Other neck: No evidence of cervical lymphadenopathy or mass. Upper chest: Small nodules in both lung apices measuring 2 mm. No apical lung consolidation or mass. Review of the MIP images confirms the above findings CTA HEAD FINDINGS Anterior circulation: The internal carotid arteries are widely patent from skull base to carotid termini. ACAs and MCAs are patent without evidence of proximal branch occlusion or significant proximal stenosis. No aneurysm is identified. Posterior circulation: The intracranial vertebral arteries are patent with the right being extremely diminutive distal to the PICA origin. There is atherosclerotic irregularity of the left V4 segment with at most mild stenosis. The right PICA and left AICA appear dominant. Patent SCAs are seen bilaterally. The basilar artery is widely patent. There are fetal origins of both PCAs. No significant proximal PCA stenosis is evident. No aneurysm is identified. Venous sinuses: As permitted by contrast timing, patent. Anatomic variants: Fetal PCAs. Review of the MIP images confirms the above findings IMPRESSION: 1. Mild atherosclerosis in the head and neck without a large vessel occlusion  or flow limiting proximal stenosis. 2. 2 mm apical lung nodules. No follow-up needed if patient is low-risk (and has no known or suspected primary neoplasm). Non-contrast chest CT can be considered in 12 months if patient is high-risk. This recommendation follows the consensus statement: Guidelines for Management of Incidental Pulmonary Nodules Detected on CT Images: From the Fleischner Society 2017; Radiology 2017; 284:228-243. 3. Aortic Atherosclerosis (ICD10-I70.0). Electronically Signed   By: Logan Bores M.D.   On: 01/27/2021 18:11   CT HEAD WO CONTRAST  Result Date: 01/27/2021 CLINICAL DATA:  Woke up this morning with vision changes, confusion, right jaw numbness, and left hand numbness. Right parietal headache for the past 3 weeks. EXAM: CT HEAD WITHOUT CONTRAST TECHNIQUE: Contiguous axial images were obtained from the base of the skull through the vertex without intravenous contrast. COMPARISON:  None. FINDINGS: Brain: No evidence of acute infarction, hemorrhage, hydrocephalus, extra-axial collection or mass lesion/mass effect. Mild generalized cerebral atrophy. Scattered mild periventricular and subcortical white matter hypodensities are nonspecific, but favored to reflect chronic microvascular ischemic changes. Vascular: No hyperdense vessel or unexpected calcification. Skull: Normal. Negative for fracture or focal lesion. Sinuses/Orbits: No acute finding. Small air-fluid level in the left sphenoid sinus. Other: None. IMPRESSION: 1. No acute intracranial abnormality. 2. Mild cerebral atrophy and chronic microvascular ischemic changes. Electronically Signed   By: Titus Dubin M.D.   On: 01/27/2021 09:26   CT Angio Neck W and/or Wo Contrast  Result Date: 01/27/2021 CLINICAL DATA:  TIA. Blurry vision, facial numbness, and right-sided headache. EXAM: CT ANGIOGRAPHY HEAD AND NECK TECHNIQUE: Multidetector CT imaging of the head and neck was performed using the standard protocol during bolus  administration of intravenous contrast. Multiplanar CT image reconstructions and MIPs were obtained to evaluate the vascular anatomy. Carotid stenosis measurements (when applicable) are obtained utilizing NASCET criteria, using the distal internal carotid diameter as the denominator. CONTRAST:  79mL OMNIPAQUE IOHEXOL 350 MG/ML SOLN COMPARISON:  None. FINDINGS: CTA NECK FINDINGS Aortic arch: Standard 3 vessel aortic arch with minimal atherosclerotic plaque. Widely patent arch vessel origins. Right carotid system: Patent with minimal atherosclerotic plaque at the carotid bifurcation. No evidence of stenosis or dissection. Tortuous proximal common carotid artery. Left carotid system: Patent with minimal atherosclerotic plaque at the carotid bifurcation. No evidence of stenosis or dissection. Vertebral arteries: Patent without evidence of stenosis or dissection. Dominant left vertebral artery. Skeleton: Moderate lower cervical disc degeneration. Severe left facet arthrosis at C7-T1 with trace anterolisthesis. Other neck: No evidence of cervical lymphadenopathy or mass. Upper chest: Small nodules in both lung apices measuring 2 mm. No apical lung consolidation or mass. Review of the MIP images confirms the above findings CTA HEAD FINDINGS Anterior circulation: The internal carotid arteries are widely patent from skull base to carotid termini. ACAs and MCAs are patent without evidence of proximal branch occlusion or significant proximal stenosis. No aneurysm is identified. Posterior circulation: The intracranial vertebral arteries are patent with the right being extremely diminutive distal to the PICA origin. There is atherosclerotic irregularity of the left V4 segment with at most mild stenosis. The right PICA and left AICA appear dominant. Patent SCAs are seen bilaterally. The basilar artery is widely patent. There are fetal origins of both PCAs. No significant proximal PCA stenosis is evident. No aneurysm is  identified. Venous sinuses: As permitted by contrast timing, patent. Anatomic variants: Fetal PCAs. Review of the MIP images confirms the above findings IMPRESSION: 1. Mild atherosclerosis in the head and neck without a large vessel occlusion or flow limiting proximal stenosis. 2. 2 mm apical lung nodules. No follow-up needed if patient is low-risk (and has no known or suspected primary neoplasm). Non-contrast chest CT can be considered in 12 months if patient is high-risk. This recommendation follows the consensus statement: Guidelines for Management of Incidental Pulmonary Nodules Detected on CT Images: From the Fleischner Society 2017; Radiology 2017; 284:228-243. 3. Aortic Atherosclerosis (ICD10-I70.0). Electronically Signed   By: Logan Bores M.D.   On: 01/27/2021 18:11   MR BRAIN WO CONTRAST  Result Date: 01/27/2021 CLINICAL DATA:  Transient ischemic attack EXAM: MRI HEAD WITHOUT CONTRAST TECHNIQUE: Multiplanar, multiecho pulse sequences of the brain and surrounding structures were obtained without intravenous contrast. COMPARISON:  None. FINDINGS: Brain: No acute infarct, mass effect or extra-axial collection. No acute or chronic hemorrhage. There is multifocal hyperintense T2-weighted  signal within the white matter. Parenchymal volume and CSF spaces are normal. The midline structures are normal. Vascular: Major flow voids are preserved. Skull and upper cervical spine: Normal calvarium and skull base. Visualized upper cervical spine and soft tissues are normal. Sinuses/Orbits:No paranasal sinus fluid levels or advanced mucosal thickening. No mastoid or middle ear effusion. Normal orbits. IMPRESSION: 1. No acute intracranial abnormality. 2. Findings of chronic small vessel ischemia. Electronically Signed   By: Ulyses Jarred M.D.   On: 01/27/2021 20:37    EKG: Independently reviewed.  EKG shows normal sinus rhythm.  No acute ST elevation or depression.  Left fascicular block noted.  QTc  469  Assessment/Plan Principal Problem:   TIA (transient ischemic attack) Patient be observed on medical telemetry floor for TIA/CVA. MRI brain, CTA head and neck has already been obtained and reviewed. No large vessel occulsions identified.  Neurology has evaluated patient and recommended observation for further TIA work-up  Symptoms have resolved.  Obtain echocardiogram to evaluate for PFO, wall motion and ejection fraction. Hypertension of 220/110 will be allowed for 24 hours per stroke protocol.  After which blood pressure will be slowly reduced to goal level.  Resume losartan in the morning as this will be over 24 hours since symptom onset and resolution Antiplatelet therapy with aspirin daily. Check lipid panel.  Neurochecks per stroke protocol  Active Problems:   Essential hypertension Monitor BP. Resume losartan in am.     Rheumatoid arthritis with rheumatoid factor of multiple sites without organ or systems involvement  Stable.      DVT prophylaxis: Padua score low. Ambulation for DVT prophylaxis.  Code Status:   Full code  Family Communication:  Diagnosis and plan discussed with patient.  She verbalized understanding and agrees with plan.  Further recommendations to follow as clinically indicated Disposition Plan:   Patient is from:  Home  Anticipated DC to:  home  Anticipated DC date:  Anticipate less than 2 midnight stay in the hospital  Anticipated DC barriers: No barriers to discharge identified  Consults called:  Neurology Admission status:  observation   Eben Burow MD [facility]  How to contact the [facility] Attending or Consulting provider [place] or covering provider during after hours [place], for this patient?   1. Check the care team in [facility] and look for a) attending/consulting [facility] provider listed and b) the [facility] team listed 2. Log into [email or web address] and use [place]'s universal password to access. If you do not have the password, please  contact the hospital operator. 3. Locate the [facility] provider you are looking for under [facility] and page to a number that you can be directly reached. 4. If you still have difficulty reaching the provider, please page the [facility] (Director on Call) for the Hospitalists listed on amion for assistance.  01/28/2021, 12:03 AM

## 2021-01-28 NOTE — Progress Notes (Signed)
PT Screen Note  Patient Details Name: Victoria Sharp MRN: [id number] DOB: November 21, 1941   Orders received and chart reviewed.  Spoke with OT who reports pt's symptoms have completely resolved and pt is moving safely and independently.  Pt has been ambulating to the bathroom on her own.  Spoke with pt who agrees that she does not have PT needs.  Will screen PT.  RN aware. Abran Richard, PT Acute Rehab Services Pager [phone] [facility] [phone]    Karlton Lemon 01/28/2021, 11:09 AM

## 2021-01-28 NOTE — ED Notes (Signed)
Pt up and ambulated to bathroom. Back in room. This RN gave pt toothbrush and wash cloths.

## 2021-01-28 NOTE — Progress Notes (Signed)
OT Cancellation Note  Patient Details Name: MURLE HELLSTROM MRN: [id number] DOB: [birth year]   Cancelled Treatment:    Reason Eval/Treat Not Completed: OT screened, no needs identified, will sign off. Pt vision has returned to baseline, already has a regularly scheduled appointment with opthamologist, L hand resolved numbness/tingling. Pt already performed ADL earlier today and walked to and from bathroom independently (confirmed by RN). OT will sign off at this time.   Merri Ray Josey Forcier 01/28/2021, 10:11 AM   Jesse Sans OTR/L Acute Rehabilitation Services Pager: [phone] Office: [phone]

## 2021-01-29 ENCOUNTER — Telehealth: Payer: Self-pay | Admitting: Rheumatology

## 2021-01-29 LAB — C4 COMPLEMENT: Complement C4, Body Fluid: 31 mg/dL (ref 12–38)

## 2021-01-29 LAB — C3 COMPLEMENT: C3 Complement: 133 mg/dL (ref 82–167)

## 2021-01-29 NOTE — Telephone Encounter (Signed)
Ok to continue on Enbrel 50 mg sq injections once weekly.

## 2021-01-29 NOTE — Telephone Encounter (Signed)
Patient was in the hospital this past week, and received new medication.Patient went to hospital due to headache, numbness with tongue, possible stoke symptoms. Patient wanted to inform you of new meds. Patient is now taking ASA 81mg  1qd, and Topamax 25mg  BID. Please call patient to advise if she needs to change anything in reference to her Enbrel prescription.

## 2021-01-29 NOTE — Telephone Encounter (Signed)
Patient advised ok to continue on Enbrel 50 mg sq injections once weekly.

## 2021-02-18 NOTE — Telephone Encounter (Signed)
Called [employer] for Enbrel PAP renewal application updated. Patient APPROVED for ENBREL patient assistance from 12/17/20 to 12/25/21.  Requested approval letter to be faxed to our clinic so we can send to scan center.  Phone number: [phone]  Knox Saliva, PharmD, MPH Clinical Pharmacist (Rheumatology and Pulmonology)

## 2021-02-25 ENCOUNTER — Ambulatory Visit (INDEPENDENT_AMBULATORY_CARE_PROVIDER_SITE_OTHER): Payer: Medicare Other | Admitting: Adult Health

## 2021-02-25 ENCOUNTER — Encounter: Payer: Self-pay | Admitting: Adult Health

## 2021-02-25 VITALS — BP 158/86 | HR 68 | Ht 62.0 in | Wt 146.0 lb

## 2021-02-25 DIAGNOSIS — G43109 Migraine with aura, not intractable, without status migrainosus: Secondary | ICD-10-CM

## 2021-02-25 MED ORDER — TOPIRAMATE 50 MG PO TABS: 50.0000 mg | ORAL_TABLET | Freq: Two times a day (BID) | ORAL | 11 refills | Status: DC

## 2021-02-25 NOTE — Progress Notes (Deleted)
[facility] [address] [place]. [place] [address] [phone]       HOSPITAL FOLLOW UP NOTE  [name] Date of Birth:  [birth year] Medical Record Number:  [id number]   Reason for Referral:  hospital stroke follow up    SUBJECTIVE:   CHIEF COMPLAINT:  No chief complaint on file.   HPI:   [name] is a80 y.o.femalewith hx of RA and HTN who presented on 01/27/2021 with an episode of double vision with floaters in her eyes, followed by circumoral numbness, left hand numbness and tingling along with trouble working her TV remote. The episode lasted about an hour and resolved prior to coming to the ED. Endorses low grade headache at the vertex for the last month, no prior history of migraine documented but reports history of headaches for several years nonetheless. Personally reviewed hospitalization pertinent progress notes, lab work and imaging with summary provided. Evaluated by Dr. Leonie Man with stroke work-up largely unremarkable and likely TIA vs complicated migrainous type headache episode and less likely inflammatory process such as GCA.  CTA head/neck unremarkable.  MRI negative.  Initiated topiramate 25 mg twice daily for headache prophylaxis.  Due to possible TIA, initiated aspirin 81 mg daily.  Resumed home dose Cozaar for HTN management.  LDL 112.  A1c 5.3.  Other stroke risk factors include advanced age and EtOH use but no prior stroke history.  She was discharged home in stable condition without therapy needs.            ROS:   14 system review of systems performed and negative with exception of ***  PMH:  Past Medical History:  Diagnosis Date  . Dermatitis   . History of breast cancer   . Hypertension   . Rheumatoid arthritis ([place])   . Tachycardia     PSH:  Past Surgical History:  Procedure Laterality Date  . ABDOMINAL HYSTERECTOMY    . BREAST LUMPECTOMY Left 1998  . NEUROMA SURGERY Right 03/2020  . WEDGE  RESECTION      Social History:  Social History   Socioeconomic History  . Marital status: Married    Spouse name: Not on file  . Number of children: Not on file  . Years of education: Not on file  . Highest education level: Not on file  Occupational History  . Not on file  Tobacco Use  . Smoking status: Never Smoker  . Smokeless tobacco: Never Used  Vaping Use  . Vaping Use: Never used  Substance and Sexual Activity  . Alcohol use: Yes    Comment: Socially   . Drug use: Never  . Sexual activity: Not on file  Other Topics Concern  . Not on file  Social History Narrative  . Not on file   Social Determinants of Health   Financial Resource Strain: Not on file  Food Insecurity: Not on file  Transportation Needs: Not on file  Physical Activity: Not on file  Stress: Not on file  Social Connections: Not on file  Intimate Partner Violence: Not on file    Family History:  Family History  Problem Relation Age of Onset  . Heart attack Father   . Kidney disease Brother   . Cancer Brother     Medications:   Current Outpatient Medications on File Prior to Visit  Medication Sig Dispense Refill  . aspirin 81 MG chewable tablet Chew 1 tablet (81 mg total) by mouth daily. 30 tablet 12  . Etanercept (  ENBREL MINI) 50 MG/ML SOCT Inject 50 mg into the skin once a week. (Patient taking differently: Inject 50 mg into the skin every Thursday.) 12 mL 0  . losartan (COZAAR) 100 MG tablet Take 100 mg by mouth daily.    . pantoprazole (PROTONIX) 20 MG tablet Take 20 mg by mouth daily.    Marland Kitchen topiramate (TOPAMAX) 25 MG tablet Take 1 tablet (25 mg total) by mouth 2 (two) times daily. 60 tablet 11   No current facility-administered medications on file prior to visit.    Allergies:   Allergies  Allergen Reactions  . Contrast Media [Iodinated Diagnostic Agents] Hives  . Latex Other (See Comments)    Ask pt and enter  . Leflunomide   . Methotrexate Derivatives   . Metronidazole Nausea  And Vomiting and Other (See Comments)    Ask pt and enter   . Sulfamethoxazole-Trimethoprim Nausea And Vomiting and Other (See Comments)    Ask pt and enter       OBJECTIVE:  Physical Exam  There were no vitals filed for this visit. There is no height or weight on file to calculate BMI. No exam data present  No flowsheet data found.   General: well developed, well nourished, seated, in no evident distress Head: head normocephalic and atraumatic.   Neck: supple with no carotid or supraclavicular bruits Cardiovascular: regular rate and rhythm, no murmurs Musculoskeletal: no deformity Skin:  no rash/petichiae Vascular:  Normal pulses all extremities   Neurologic Exam Mental Status: Awake and fully alert. Oriented to place and time. Recent and remote memory intact. Attention span, concentration and fund of knowledge appropriate. Mood and affect appropriate.  Cranial Nerves: Fundoscopic exam reveals sharp disc margins. Pupils equal, briskly reactive to light. Extraocular movements full without nystagmus. Visual fields full to confrontation. Hearing intact. Facial sensation intact. Face, tongue, palate moves normally and symmetrically.  Motor: Normal bulk and tone. Normal strength in all tested extremity muscles Sensory.: intact to touch , pinprick , position and vibratory sensation.  Coordination: Rapid alternating movements normal in all extremities. Finger-to-nose and heel-to-shin performed accurately bilaterally. Gait and Station: Arises from chair without difficulty. Stance is normal. Gait demonstrates normal stride length and balance with ***. Tandem walk and heel toe ***.  Reflexes: 1+ and symmetric. Toes downgoing.     NIHSS  *** Modified Rankin  ***      ASSESSMENT: Victoria Sharp is a 80 y.o. year old female presented on 01/27/2021 with 1 month of increasing headaches with some visual symptoms and transient episode of perioral and bilateral hand paresthesias likely  in setting of complicated migraine given negative brain imaging studies although TIA within differential diagnosis.  Vascular risk factors include HTN advanced age and EtOH use.      PLAN:  1. Complicated migraine:  2. Possible TIA: Continue aspirin 81 mg daily for stroke prevention.  Discussed secondary stroke prevention measures and importance of close PCP follow up for aggressive stroke risk factor management including HTN with BP goal<130/90 3.     Follow up in *** or call earlier if needed   CC:  [facility] provider: Dr. Leonie Man PCP: [name]., MD    I spent *** minutes of face-to-face and non-face-to-face time with patient.  This included previsit chart review, lab review, study review, order entry, electronic health record documentation, patient education regarding recent stroke, residual deficits, importance of managing stroke risk factors and answered all questions to patient satisfaction     Frann Rider,  AGNP-BC  [facility] [address] [place], [place] [address]  Phone [phone] Fax [phone] Note: This document was prepared with digital dictation and possible smart phrase technology. Any transcriptional errors that result from this process are unintentional.

## 2021-02-25 NOTE — Progress Notes (Signed)
[facility] [address] [place]. [place] [address] [phone]       HOSPITAL FOLLOW UP NOTE  [name] Date of Birth:  1941-05-25 Medical Record Number:  [id number]   Reason for Referral:  Hospital follow up    SUBJECTIVE:   CHIEF COMPLAINT:  Chief Complaint  Patient presents with  . Follow-up    TR alone Pt is well, having taste/ appetite issue, has headaches but was before the stroke.     HPI:   [name] is a80 y.o.femalewith hx of RA and HTN who presented on 01/27/2021 with an episode of double vision with floaters in her eyes, followed by circumoral numbness, left hand numbness and tingling along with trouble working her TV remote. The episode lasted about an hour and resolved prior to coming to the ED. Endorses low grade headache at the vertex for the last month, no prior history of migraine documented but reports history of headaches for several years nonetheless. Personally reviewed hospitalization pertinent progress notes, lab work and imaging with summary provided. Evaluated by Dr. Leonie Man with stroke work-up largely unremarkable and likely TIA vs complicated migrainous type headache episode and less likely inflammatory process such as GCA.  CTA head/neck unremarkable.  MRI negative.  Initiated topiramate 25 mg twice daily for headache prophylaxis.  Due to possible TIA, initiated aspirin 81 mg daily.  Resumed home dose Cozaar for HTN management.  LDL 112.  A1c 5.3.  Other stroke risk factors include advanced age and EtOH use but no prior stroke history.  She was discharged home in stable condition without therapy needs.   Since discharge, she continues to experience migraines frequently but severity greatly decreased.  She has not had any additional auras or neurological symptoms.  Typically located right frontal temporal but occasionally tightness sensation behind her eyes.  She does endorse that she remembers her mother  frequently complaining of headaches but unsure if she was diagnosed with migraines.  She has remained on topiramate 25 mg twice daily -reports initially felt dizzy but subsided after couple days.  She also reports altered taste and decreased appetite since initiating  She has remained on aspirin 81 mg daily for stroke prevention -denies bleeding or bruising Blood pressure today 158/86 -routinely monitor at home which has been stable  No further concerns at this time      ROS:   14 system review of systems performed and negative with exception of those listed in HPI  PMH:  Past Medical History:  Diagnosis Date  . Dermatitis   . History of breast cancer   . Hypertension   . Rheumatoid arthritis ([place])   . Tachycardia     PSH:  Past Surgical History:  Procedure Laterality Date  . ABDOMINAL HYSTERECTOMY    . BREAST LUMPECTOMY Left 1998  . NEUROMA SURGERY Right 03/2020  . WEDGE RESECTION      Social History:  Social History   Socioeconomic History  . Marital status: Married    Spouse name: Not on file  . Number of children: Not on file  . Years of education: Not on file  . Highest education level: Not on file  Occupational History  . Not on file  Tobacco Use  . Smoking status: Never Smoker  . Smokeless tobacco: Never Used  Vaping Use  . Vaping Use: Never used  Substance and Sexual Activity  . Alcohol use: Yes    Comment: Socially   . Drug use: Never  .  Sexual activity: Not on file  Other Topics Concern  . Not on file  Social History Narrative  . Not on file   Social Determinants of Health   Financial Resource Strain: Not on file  Food Insecurity: Not on file  Transportation Needs: Not on file  Physical Activity: Not on file  Stress: Not on file  Social Connections: Not on file  Intimate Partner Violence: Not on file    Family History:  Family History  Problem Relation Age of Onset  . Heart attack Father   . Kidney disease Brother   . Cancer  Brother     Medications:   Current Outpatient Medications on File Prior to Visit  Medication Sig Dispense Refill  . aspirin 81 MG chewable tablet Chew 1 tablet (81 mg total) by mouth daily. 30 tablet 12  . cholecalciferol (VITAMIN D3) 25 MCG (1000 UNIT) tablet Take 1,000 Units by mouth daily.    . Etanercept (ENBREL MINI) 50 MG/ML SOCT Inject 50 mg into the skin once a week. (Patient taking differently: Inject 50 mg into the skin every Thursday.) 12 mL 0  . losartan (COZAAR) 100 MG tablet Take 100 mg by mouth daily.    . pantoprazole (PROTONIX) 20 MG tablet Take 20 mg by mouth daily.     No current facility-administered medications on file prior to visit.    Allergies:   Allergies  Allergen Reactions  . Contrast Media [Iodinated Diagnostic Agents] Hives  . Latex Other (See Comments)    Ask pt and enter  . Leflunomide   . Methotrexate Derivatives   . Metronidazole Nausea And Vomiting and Other (See Comments)    Ask pt and enter   . Sulfamethoxazole-Trimethoprim Nausea And Vomiting and Other (See Comments)    Ask pt and enter       OBJECTIVE:  Physical Exam  Vitals:   02/25/21 1000  BP: (!) 158/86  Pulse: 68  Weight: 146 lb (66.2 kg)  Height: 5\' 2"  (1.575 m)   Body mass index is 26.7 kg/m. No exam data present  General: well developed, well nourished,  very pleasant elderly Caucasian female, seated, in no evident distress Head: head normocephalic and atraumatic.   Neck: supple with no carotid or supraclavicular bruits Cardiovascular: regular rate and rhythm, no murmurs Musculoskeletal: no deformity Skin:  no rash/petichiae Vascular:  Normal pulses all extremities   Neurologic Exam Mental Status: Awake and fully alert.   Fluent speech and language.  Oriented to place and time. Recent and remote memory intact. Attention span, concentration and fund of knowledge appropriate. Mood and affect appropriate.  Cranial Nerves: Fundoscopic exam reveals sharp disc margins.  Pupils equal, briskly reactive to light. Extraocular movements full without nystagmus. Visual fields full to confrontation. Hearing intact. Facial sensation intact. Face, tongue, palate moves normally and symmetrically.  Motor: Normal bulk and tone. Normal strength in all tested extremity muscles Sensory.: intact to touch , pinprick , position and vibratory sensation.  Coordination: Rapid alternating movements normal in all extremities. Finger-to-nose and heel-to-shin performed accurately bilaterally. Gait and Station: Arises from chair without difficulty. Stance is normal. Gait demonstrates normal stride length and balance with without use of assistive device. Tandem walk and heel toe with mild difficulty.  Reflexes: 1+ and symmetric. Toes downgoing.     NIHSS  0 Modified Rankin  0      ASSESSMENT: Victoria Sharp is a 80 y.o. year old female presented on 01/27/2021 with 1 month of increasing headaches with some  visual symptoms and transient episode of perioral and bilateral hand paresthesias likely in setting of complicated migraine given negative brain imaging studies although TIA within differential diagnosis.  Vascular risk factors include HTN advanced age and EtOH use.      PLAN:  1. Complicated migraine:  a. Continue migraine frequency but less severity since initiating topiramate.   b. No additional visual auras or neurological symptoms associated c. Recommend increasing topiramate dosage to 50 mg twice daily - recommend increasing nighttime dose to 50 mg for couple days with continuing 25 mg a.m. and then can increase to 50 mg twice daily 2. Possible TIA: Continue aspirin 81 mg daily for stroke prevention.  Discussed secondary stroke prevention measures and importance of close PCP follow up for aggressive stroke risk factor management including HTN with BP goal<130/90    Per patient request, follow-up with Dr. Leonie Man in 3 months or call earlier if needed    CC:  [place]  provider: Dr. Leonie Man PCP: [name]., MD    I spent 45 minutes of face-to-face and non-face-to-face time with patient.  This included previsit chart review including recent hospitalization pertinent progress notes, lab work and imaging, lab review, study review, order entry, electronic health record documentation, patient education regarding recent hospitalization complicated migraine versus possible TIA, further migraine management and treatment options, possible TIA and importance of managing stroke risk factors and answered all other questions to patient satisfaction  Frann Rider, AGNP-BC  [facility] [address] [place], [place] [address]  Phone [phone] Fax [phone] Note: This document was prepared with digital dictation and possible smart phrase technology. Any transcriptional errors that result from this process are unintentional.

## 2021-02-25 NOTE — Patient Instructions (Addendum)
Your Plan:  Increase topamax - take 25mg  in the morning and 50mg  at bedtime  After 3 days, increase further to 50mg  twice a day (about 12 hours apart)  Because we cannot fully rule out a mini-stroke or TIA, it is recommend to continue aspirin 81mg  daily for stroke prevention    Follow up in 3 months with Dr. Leonie Man or call earlier if needed      Thank you for coming to see [place] at [facility]. I hope we have been able to provide you high quality care today.  You may receive a patient satisfaction survey over the next few weeks. We would appreciate your feedback and comments so that we may continue to improve ourselves and the health of our patients.     Migraine Headache A migraine headache is an intense, throbbing pain on one side or both sides of the head. Migraine headaches may also cause other symptoms, such as nausea, vomiting, and sensitivity to light and noise. A migraine headache can last from 4 hours to 3 days. Talk with your doctor about what things may bring on (trigger) your migraine headaches. What are the causes? The exact cause of this condition is not known. However, a migraine may be caused when nerves in the brain become irritated and release chemicals that cause inflammation of blood vessels. This inflammation causes pain. This condition may be triggered or caused by:  Drinking alcohol.  Smoking.  Taking medicines, such as: ? Medicine used to treat chest pain (nitroglycerin). ? Birth control pills. ? Estrogen. ? Certain blood pressure medicines.  Eating or drinking products that contain nitrates, glutamate, aspartame, or tyramine. Aged cheeses, chocolate, or caffeine may also be triggers.  Doing physical activity. Other things that may trigger a migraine headache include:  Menstruation.  Pregnancy.  Hunger.  Stress.  Lack of sleep or too much sleep.  Weather changes.  Fatigue. What increases the risk? The following factors  may make you more likely to experience migraine headaches:  Being a certain age. This condition is more common in people who are 31-3 years old.  Being female.  Having a family history of migraine headaches.  Being Caucasian.  Having a mental health condition, such as depression or anxiety.  Being obese. What are the signs or symptoms? The main symptom of this condition is pulsating or throbbing pain. This pain may:  Happen in any area of the head, such as on one side or both sides.  Interfere with daily activities.  Get worse with physical activity.  Get worse with exposure to bright lights or loud noises. Other symptoms may include:  Nausea.  Vomiting.  Dizziness.  General sensitivity to bright lights, loud noises, or smells. Before you get a migraine headache, you may get warning signs (an aura). An aura may include:  Seeing flashing lights or having blind spots.  Seeing bright spots, halos, or zigzag lines.  Having tunnel vision or blurred vision.  Having numbness or a tingling feeling.  Having trouble talking.  Having muscle weakness. Some people have symptoms after a migraine headache (postdromal phase), such as:  Feeling tired.  Difficulty concentrating. How is this diagnosed? A migraine headache can be diagnosed based on:  Your symptoms.  A physical exam.  Tests, such as: ? CT scan or an MRI of the head. These imaging tests can help rule out other causes of headaches. ? Taking fluid from the spine (lumbar puncture) and analyzing it (cerebrospinal fluid analysis, or CSF analysis).  How is this treated? This condition may be treated with medicines that:  Relieve pain.  Relieve nausea.  Prevent migraine headaches. Treatment for this condition may also include:  Acupuncture.  Lifestyle changes like avoiding foods that trigger migraine headaches.  Biofeedback.  Cognitive behavioral therapy. Follow these instructions at  home: Medicines  Take over-the-counter and prescription medicines only as told by your health care provider.  Ask your health care provider if the medicine prescribed to you: ? Requires you to avoid driving or using heavy machinery. ? Can cause constipation. You may need to take these actions to prevent or treat constipation:  Drink enough fluid to keep your urine pale yellow.  Take over-the-counter or prescription medicines.  Eat foods that are high in fiber, such as beans, whole grains, and fresh fruits and vegetables.  Limit foods that are high in fat and processed sugars, such as fried or sweet foods. Lifestyle  Do not drink alcohol.  Do not use any products that contain nicotine or tobacco, such as cigarettes, e-cigarettes, and chewing tobacco. If you need help quitting, ask your health care provider.  Get at least 8 hours of sleep every night.  Find ways to manage stress, such as meditation, deep breathing, or yoga. General instructions  Keep a journal to find out what may trigger your migraine headaches. For example, write down: ? What you eat and drink. ? How much sleep you get. ? Any change to your diet or medicines.  If you have a migraine headache: ? Avoid things that make your symptoms worse, such as bright lights. ? It may help to lie down in a dark, quiet room. ? Do not drive or use heavy machinery. ? Ask your health care provider what activities are safe for you while you are experiencing symptoms.  Keep all follow-up visits as told by your health care provider. This is important.      Contact a health care provider if:  You develop symptoms that are different or more severe than your usual migraine headache symptoms.  You have more than 15 headache days in one month. Get help right away if:  Your migraine headache becomes severe.  Your migraine headache lasts longer than 72 hours.  You have a fever.  You have a stiff neck.  You have vision  loss.  Your muscles feel weak or like you cannot control them.  You start to lose your balance often.  You have trouble walking.  You faint.  You have a seizure. Summary  A migraine headache is an intense, throbbing pain on one side or both sides of the head. Migraines may also cause other symptoms, such as nausea, vomiting, and sensitivity to light and noise.  This condition may be treated with medicines and lifestyle changes. You may also need to avoid certain things that trigger a migraine headache.  Keep a journal to find out what may trigger your migraine headaches.  Contact your health care provider if you have more than 15 headache days in a month or you develop symptoms that are different or more severe than your usual migraine headache symptoms. This information is not intended to replace advice given to you by your health care provider. Make sure you discuss any questions you have with your health care provider. Document Revised: 04/05/2019 Document Reviewed: 01/24/2019 Elsevier Patient Education  [place].

## 2021-02-26 NOTE — Progress Notes (Signed)
I agree with the above plan 

## 2021-03-04 ENCOUNTER — Telehealth: Payer: Self-pay | Admitting: Adult Health

## 2021-03-04 NOTE — Telephone Encounter (Signed)
Called patient and advised her of NP's message, recommendations. She verbalized understanding, appreciation.

## 2021-03-04 NOTE — Telephone Encounter (Signed)
Spoke with patient who stated she's been taking topamax 25 mg in am, 50 mg at night since 3/4 then increased to 50 mg twice daily since Monday. She was feeling slower at comprehending but today is much better. She has tingling in her feet. We discussed that she can go back to topamax 25 mg in am, 50 mg pm until symptoms resolve then increase to 50 mg twice a day, or she can stay at current dose a few more days to see if she adjusts to it. She stated since she is feeling better today at her comprehension. She'll stay at 50 mg BID to see how she does. She then stated she has PT 2x week and is exhausted for 2 days afterwards. She would like to know if Janett Billow is ok with her stopping PT x 1-2 weeks until she adjusts to topamax and feels better.  I advised will let her know what NP advises. Patient verbalized understanding, appreciation.

## 2021-03-04 NOTE — Telephone Encounter (Addendum)
Pt asking for a call to discuss how she feels since her medication has been increased/doubled.  Pt states she is no longer dizzy(which is great) Pt states she is slow to respond(but that is getting better).  Pt asked it be noted that she is having tingling in her feet. Pt states PT is starting to become a problem and would like to know what Janett Billow thinks about her taking a break on PT for now, please call

## 2021-03-04 NOTE — Telephone Encounter (Signed)
Brain fog or cognitive slowing and tingling can be side effects of Topamax -hopefully these will subside over time but if not, would recommend decreasing dosage back to 25 mg a.m. and 50 mg p.m.  Okay to hold PT for 1-2 weeks until she feels better - she can just let her PT know  Thank you!

## 2021-03-11 ENCOUNTER — Encounter: Payer: Self-pay | Admitting: Physician Assistant

## 2021-03-17 ENCOUNTER — Telehealth: Payer: Self-pay | Admitting: *Deleted

## 2021-03-17 NOTE — Telephone Encounter (Addendum)
Received DEXA results from [facility]  Osteopenia  Date of Scan: 03/11/2021 Lowest T-score and lowest site measured: -1.8 AP spine, BMD 0.854 Significant changes in BMD and site measured (5% and above): n/a  Current Regimen: Vitamin D  Recommendation:Repeat DEXA in 2 years, Calcium 1200 mg daily, diet and supplements, resistive exercises  Reviewed by: Dr. Bo Merino  Next Appointment: 06/24/2021

## 2021-04-01 ENCOUNTER — Other Ambulatory Visit: Payer: Self-pay

## 2021-04-01 ENCOUNTER — Telehealth: Payer: Self-pay

## 2021-04-01 ENCOUNTER — Other Ambulatory Visit: Payer: Self-pay | Admitting: *Deleted

## 2021-04-01 DIAGNOSIS — M0579 Rheumatoid arthritis with rheumatoid factor of multiple sites without organ or systems involvement: Secondary | ICD-10-CM

## 2021-04-01 MED ORDER — ENBREL MINI 50 MG/ML ~~LOC~~ SOCT: 50.0000 mg | SUBCUTANEOUS | 0 refills | Status: DC

## 2021-04-01 MED ORDER — ENBREL MINI 50 MG/ML ~~LOC~~ SOCT
50.0000 mg | SUBCUTANEOUS | 0 refills | Status: DC
Start: 2021-04-01 — End: 2021-04-01

## 2021-04-01 NOTE — Telephone Encounter (Signed)
Does this patient need a refill now or is it a "no print?"

## 2021-04-01 NOTE — Telephone Encounter (Signed)
Patient needs RF now, corrected to normal. Sorry.

## 2021-04-01 NOTE — Telephone Encounter (Signed)
Patient states she called [employer] to order her Enbrel and was told the prescription has not been sent in from Dr. Estanislado Sharp.  Patient states they asked her to call the office and request the prescription be sent in today so they can ship the medication out next week.  Patient states she took her last injection today, 04/01/21.

## 2021-04-01 NOTE — Telephone Encounter (Signed)
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 Most likely due to ACE inhibitor use. CBC is normal. Please forward labs to patient's PCP.  TB Gold: 01/14/2021  Okay to refill Enbrel?

## 2021-04-01 NOTE — Addendum Note (Signed)
Addended by: Shona Needles on: 04/01/2021 11:31 AM   Modules accepted: Orders

## 2021-04-01 NOTE — Telephone Encounter (Signed)
I called patient, RX sent 4/72022 at 11:30 am.

## 2021-04-01 NOTE — Telephone Encounter (Signed)
Patient left a voicemail stating she just got a message from [facility] and it was giving her instructions on how to use Enbrel.  Patient states she is a little confused because she called earlier this morning to let the office know [employer] hasn't received her prescription.  Patient requested a return call.

## 2021-04-21 ENCOUNTER — Telehealth: Payer: Self-pay

## 2021-04-21 ENCOUNTER — Other Ambulatory Visit: Payer: Self-pay | Admitting: *Deleted

## 2021-04-21 DIAGNOSIS — M545 Low back pain, unspecified: Secondary | ICD-10-CM

## 2021-04-21 NOTE — Telephone Encounter (Signed)
Patient called stating she is having back pain with standing and sitting which has been getting worse.  Patient states she has been attending physical therapy for a couple of months, but hasn't noticed any improvement.  Patient states her therapist recommended she have an x-ray.  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  Patient is not sure if her back is related to her arthritis or something completely different.  Patient requested a return call.

## 2021-04-21 NOTE — Telephone Encounter (Signed)
I called patient, referral ordered.

## 2021-04-21 NOTE — Telephone Encounter (Signed)
Please refer to orthopedics.

## 2021-05-27 ENCOUNTER — Other Ambulatory Visit: Payer: Self-pay

## 2021-05-27 ENCOUNTER — Encounter: Payer: Self-pay | Admitting: Specialist

## 2021-05-27 ENCOUNTER — Ambulatory Visit (INDEPENDENT_AMBULATORY_CARE_PROVIDER_SITE_OTHER): Payer: Medicare Other

## 2021-05-27 ENCOUNTER — Ambulatory Visit (INDEPENDENT_AMBULATORY_CARE_PROVIDER_SITE_OTHER): Payer: Medicare Other | Admitting: Specialist

## 2021-05-27 VITALS — BP 159/91 | HR 97 | Ht 62.0 in | Wt 146.0 lb

## 2021-05-27 DIAGNOSIS — M533 Sacrococcygeal disorders, not elsewhere classified: Secondary | ICD-10-CM

## 2021-05-27 DIAGNOSIS — M4316 Spondylolisthesis, lumbar region: Secondary | ICD-10-CM

## 2021-05-27 DIAGNOSIS — M545 Low back pain, unspecified: Secondary | ICD-10-CM

## 2021-05-27 NOTE — Progress Notes (Addendum)
Office Visit Note   Patient: Victoria Sharp           Date of Birth: 09-09-1941           MRN: [id number] Visit Date: 05/27/2021              Requested by: Bo Merino, MD [address] [place],  [name] [address] PCP: [name]., MD   Assessment & Plan: Visit Diagnoses:  1. Low back pain, unspecified back pain laterality, unspecified chronicity, unspecified whether sciatica present   2. Pain of right sacroiliac joint   3. Spondylolisthesis, lumbar region   4. Sacroiliac joint disease     Plan: Avoid bending, stooping and avoid lifting weights greater than 10 lbs. Avoid prolong standing and walking. Avoid frequent bending and stooping  No lifting greater than 10 lbs. May use ice or moist heat for pain. Weight loss is of benefit. Dr. Romona Curls secretary/Assistant will call to arrange for right Sacroiliac injection    Injection with steroid may be of benefit. Hemp CBD capsules, [email or web address] 5,000-7,000 mg per bottle, 60 capsules per bottle, take one capsule twice a day. Cane in the left hand to use with right leg weight bearing. Follow-Up Instructions: No follow-ups on file.   Follow-Up Instructions: Return in about 4 weeks (around 06/24/2021).   Orders:  Orders Placed This Encounter  Procedures  . XR Lumbar Spine 2-3 Views  . Ambulatory referral to Physical Medicine Rehab   No orders of the defined types were placed in this encounter.     Procedures: No procedures performed   Clinical Data: No additional findings.   Subjective: Chief Complaint  Patient presents with  . Lower Back - Pain    80 year old female with greater than one year history of back and right buttock pain. It is worsening and it is made worse with first standing and weightbearing on the right leg. Also pain with sitting and it localizes to the right medial buttock and SI area. 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 Experiences  numbness in the feet and hands with paresthesias of the hands and feet. She has a history of  Light stroke or TIA in February and she was admitted to [facility] overnight for work up and she sees Dr.Sethi for this. Started Topomax for migraines and stroke and ASA 81 mg. Here for right lower back and buttock pain  Today pain is about a 4 of 10. It is severe sometime and hits with changing to standing from sitting and it can awaken her at night. Has no bowel or bladder difficulty. She was able to walk 3 miles at a time up till about 6 months ago, now only about 1/2 block and has to stop. She is not leaning on carts. Had facial numbness and throat numbess and difficulty performing small tasks.    Review of Systems  Constitutional: Negative.  Negative for activity change, appetite change, chills, diaphoresis, fatigue, fever and unexpected weight change.  HENT: Negative.  Negative for congestion, dental problem, drooling, ear discharge, ear pain, facial swelling, hearing loss, mouth sores, nosebleeds, postnasal drip, rhinorrhea, sinus pressure, sinus pain, sneezing, sore throat, tinnitus, trouble swallowing and voice change.   Eyes: Negative.  Negative for photophobia, pain, discharge, redness, itching and visual disturbance.  Respiratory: Negative.  Negative for apnea, cough, choking, chest tightness, shortness of breath, wheezing and stridor.   Cardiovascular: Negative.  Negative for chest pain, palpitations and leg  swelling.  Gastrointestinal: Negative for abdominal distention, abdominal pain, anal bleeding, blood in stool, constipation, diarrhea, nausea and rectal pain.  Endocrine: Positive for cold intolerance. Negative for heat intolerance, polydipsia, polyphagia and polyuria.  Genitourinary: Negative.  Negative for difficulty urinating, dyspareunia, dysuria, enuresis, flank pain and frequency.  Musculoskeletal: Positive for back pain and gait problem. Negative for arthralgias, joint swelling, myalgias,  neck pain and neck stiffness.  Skin: Negative.  Negative for color change, pallor, rash and wound.  Allergic/Immunologic: Negative for environmental allergies, food allergies and immunocompromised state.  Neurological: Positive for facial asymmetry, weakness and numbness. Negative for dizziness, tremors, seizures, syncope, speech difficulty, light-headedness and headaches.  Hematological: Negative for adenopathy. Does not bruise/bleed easily.  Psychiatric/Behavioral: Negative for agitation, behavioral problems, confusion, decreased concentration, dysphoric mood, hallucinations, self-injury, sleep disturbance and suicidal ideas. The patient is not nervous/anxious and is not hyperactive.      Objective: Vital Signs: BP (!) 159/91 (BP Location: Left Arm, Patient Position: Sitting)   Pulse 97   Ht 5\' 2"  (1.575 m)   Wt 146 lb (66.2 kg)   BMI 26.70 kg/m   Physical Exam Constitutional:      Appearance: She is well-developed.  HENT:     Head: Normocephalic and atraumatic.  Eyes:     Pupils: Pupils are equal, round, and reactive to light.  Pulmonary:     Effort: Pulmonary effort is normal.     Breath sounds: Normal breath sounds.  Abdominal:     General: Bowel sounds are normal.     Palpations: Abdomen is soft.  Musculoskeletal:        General: Normal range of motion.     Cervical back: Normal range of motion and neck supple.  Skin:    General: Skin is warm and dry.  Neurological:     Mental Status: She is alert and oriented to person, place, and time.  Psychiatric:        Behavior: Behavior normal.        Thought Content: Thought content normal.        Judgment: Judgment normal.     Ortho Exam  Specialty Comments:  No specialty comments available.  Imaging: XR Lumbar Spine 2-3 Views  Result Date: 05/27/2021 Left lumbar scoliosis 11-12 degrees, grade 1 spondylolisthesis, mild DDD thoracolumbar spine and right SI joint sclerosis.     PMFS History: Patient Active Problem  List   Diagnosis Date Noted  . TIA (transient ischemic attack) 01/27/2021  . Rheumatoid arthritis with rheumatoid factor of multiple sites without organ or systems involvement ([place]) 06/26/2020  . Essential hypertension 06/26/2020  . MVP (mitral valve prolapse) 06/26/2020  . History of Barrett's esophagus 06/26/2020  . History of gastroesophageal reflux (GERD) 06/26/2020  . History of breast cancer 06/26/2020  . High risk medication use 06/26/2020   Past Medical History:  Diagnosis Date  . Dermatitis   . History of breast cancer   . Hypertension   . Rheumatoid arthritis ([place])   . Tachycardia     Family History  Problem Relation Age of Onset  . Heart attack Father   . Kidney disease Brother   . Cancer Brother     Past Surgical History:  Procedure Laterality Date  . ABDOMINAL HYSTERECTOMY    . BREAST LUMPECTOMY Left 1998  . NEUROMA SURGERY Right 03/2020  . WEDGE RESECTION     Social History   Occupational History  . Not on file  Tobacco Use  . Smoking status: Never Smoker  .  Smokeless tobacco: Never Used  Vaping Use  . Vaping Use: Never used  Substance and Sexual Activity  . Alcohol use: Yes    Comment: Socially   . Drug use: Never  . Sexual activity: Not on file

## 2021-05-27 NOTE — Patient Instructions (Addendum)
Plan: Avoid bending, stooping and avoid lifting weights greater than 10 lbs. Avoid prolong standing and walking. Avoid frequent bending and stooping  No lifting greater than 10 lbs. May use ice or moist heat for pain. Weight loss is of benefit. Dr. Romona Curls secretary/Assistant will call to arrange for right Sacroiliac injection    Injection with steroid may be of benefit. Hemp CBD capsules, [email or web address] 5,000-7,000 mg per bottle, 60 capsules per bottle, take one capsule twice a day. Cane in the left hand to use with right leg weight bearing. Follow-Up Instructions: No follow-ups on file.

## 2021-06-07 ENCOUNTER — Ambulatory Visit (INDEPENDENT_AMBULATORY_CARE_PROVIDER_SITE_OTHER): Payer: Medicare Other | Admitting: Physical Medicine and Rehabilitation

## 2021-06-07 ENCOUNTER — Encounter: Payer: Self-pay | Admitting: Physical Medicine and Rehabilitation

## 2021-06-07 ENCOUNTER — Other Ambulatory Visit: Payer: Self-pay

## 2021-06-07 ENCOUNTER — Ambulatory Visit: Payer: Self-pay

## 2021-06-07 DIAGNOSIS — M461 Sacroiliitis, not elsewhere classified: Secondary | ICD-10-CM

## 2021-06-07 MED ORDER — METHYLPREDNISOLONE ACETATE 80 MG/ML IJ SUSP
80.0000 mg | INTRAMUSCULAR | Status: AC | PRN
  Administered 2021-06-07: 13:00:00 80 mg via INTRA_ARTICULAR

## 2021-06-07 MED ORDER — BUPIVACAINE HCL 0.5 % IJ SOLN
2.0000 mL | INTRAMUSCULAR | Status: AC | PRN
  Administered 2021-06-07: 13:00:00 2 mL via INTRA_ARTICULAR

## 2021-06-07 NOTE — Progress Notes (Signed)
Pt state she has pain in lower buttocks. Pt state when she get out of bed or turning over to her right left she feels pain. Pt state the pain wakes her up at night. Pt state her use pain cream and pain meds to ease her pain.   Numeric Pain Rating Scale and Functional Assessment Average Pain 3   In the last MONTH (on 0-10 scale) has pain interfered with the following?  1. General activity like being  able to carry out your everyday physical activities such as walking, climbing stairs, carrying groceries, or moving a chair?  Rating(8)    -BT, -Dye Allergies.

## 2021-06-07 NOTE — Progress Notes (Signed)
MIMIE GOERING - 80 y.o. female MRN [id number]  Date of birth: [birth year]  Office Visit Note: Visit Date: 06/07/2021 PCP: [name]., MD Referred by: [name].,*  Subjective: No chief complaint on file.  HPI:  ANNALEI FRIESZ is a 80 y.o. female who comes in today at the request of Dr. Basil Dess for planned Right anesthetic sacroiliac joint arthrogram with fluoroscopic guidance.  The patient has failed conservative care including home exercise, medications, time and activity modification.  This injection will be diagnostic and hopefully therapeutic.  Please see requesting physician notes for further details and justification.  ROS Otherwise per HPI.  Assessment & Plan: Visit Diagnoses:    ICD-10-CM   1. 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   Meds & Orders: No orders of the defined types were placed in this encounter.  No orders of the defined types were placed in this encounter.   Follow-up: No follow-ups on file.   Procedures: Sacroiliac Joint Inj on 06/07/2021 12:38 PM Indications: pain and diagnostic evaluation Details: 22 G 3.5 in needle, fluoroscopy-guided posterior approach Medications: 2 mL bupivacaine 0.5 %; 80 mg methylPREDNISolone acetate 80 MG/ML Outcome: tolerated well, no immediate complications  Sacroiliac Joint Intra-Articular Injection - Posterior Approach with Fluoroscopic Guidance   Position: PRONE  Additional Comments: Vital signs were monitored before and after the procedure. Patient was prepped and draped in the usual sterile fashion. The correct patient, procedure, and site was verified.   Injection Procedure Details:   Location/Site:  Sacroiliac joint  Needle size: 3.5 in Spinal Needle  Needle type: Spinal  Needle Placement: Intra-articular  Findings:  -Comments: There was excellent flow of contrast producing a partial arthrogram of the sacroiliac joint.   Procedure  Details: Starting with a 90 degree vertical and midline orientation the fluoroscope was tilted cranially 20 to 25 degrees and the target area of the inferior most part of the SI joint on the side mentioned above was visualized.  The soft tissues overlying this target were infiltrated with 4 ml. of 1% Lidocaine without Epinephrine. A #22 gauge spinal needle was inserted perpendicular to the fluoroscope table and advanced into the posterior inferior joint space using fluoroscopic guidance.  Position in the joint space was confirmed by obtaining a partial arthrogram using a 2 ml. volume of Omniscan contrast agent due to contrast allergy. After negative aspirate for gross pus or blood, the injectate was delivered to the joint. Radiographs were obtained for documentation purposes.   Additional Comments:   Dressing: Bandaid    Post-procedure details: Patient was observed during the procedure. Post-procedure instructions were reviewed.  Patient left the clinic in stable condition.    There was excellent flow of contrast producing a partial arthrogram of the sacroiliac joint.  Procedure, treatment alternatives, risks and benefits explained, specific risks discussed. Consent was given by the patient. Immediately prior to procedure a time out was called to verify the correct patient, procedure, equipment, support staff and site/side marked as required. Patient was prepped and draped in the usual sterile fashion.         Clinical History: No specialty comments available.     Objective:  VS:  HT:    WT:   BMI:     BP:   HR: bpm  TEMP: ( )  RESP:  Physical Exam Musculoskeletal:        General: Tenderness present.     Comments: Pain over right PSIS  and positive Patrick's.     Imaging: No results found.

## 2021-06-08 ENCOUNTER — Other Ambulatory Visit: Payer: Self-pay | Admitting: *Deleted

## 2021-06-08 ENCOUNTER — Telehealth: Payer: Self-pay

## 2021-06-08 DIAGNOSIS — M0579 Rheumatoid arthritis with rheumatoid factor of multiple sites without organ or systems involvement: Secondary | ICD-10-CM

## 2021-06-08 DIAGNOSIS — Z79899 Other long term (current) drug therapy: Secondary | ICD-10-CM

## 2021-06-08 DIAGNOSIS — Z9225 Personal history of immunosupression therapy: Secondary | ICD-10-CM

## 2021-06-08 DIAGNOSIS — Z111 Encounter for screening for respiratory tuberculosis: Secondary | ICD-10-CM

## 2021-06-08 NOTE — Telephone Encounter (Signed)
Lab Orders released.  

## 2021-06-08 NOTE — Telephone Encounter (Signed)
Patient called requesting her labwork orders be sent to [facility] in [facility] on [address].  Patient states she will be going tomorrow, 06/09/21.

## 2021-06-10 ENCOUNTER — Ambulatory Visit (INDEPENDENT_AMBULATORY_CARE_PROVIDER_SITE_OTHER): Payer: Medicare Other | Admitting: Neurology

## 2021-06-10 ENCOUNTER — Ambulatory Visit: Payer: Medicare Other | Admitting: Specialist

## 2021-06-10 ENCOUNTER — Encounter: Payer: Self-pay | Admitting: Neurology

## 2021-06-10 VITALS — BP 144/93 | HR 83 | Ht 62.0 in | Wt 146.0 lb

## 2021-06-10 DIAGNOSIS — G43009 Migraine without aura, not intractable, without status migrainosus: Secondary | ICD-10-CM

## 2021-06-10 DIAGNOSIS — R202 Paresthesia of skin: Secondary | ICD-10-CM | POA: Diagnosis not present

## 2021-06-10 MED ORDER — TOPIRAMATE 50 MG PO TABS: 50.0000 mg | ORAL_TABLET | Freq: Two times a day (BID) | ORAL | 11 refills | Status: DC

## 2021-06-10 NOTE — Progress Notes (Signed)
CBC is normal.  Creatinine is mildly elevated and stable.

## 2021-06-10 NOTE — Patient Instructions (Signed)
I had a long discussion with the patient regarding her typical migraine episodes which seem to respond quite well to Topamax she is essentially been headache free for more than 6 weeks.  I recommend tapering trial of Topamax and reduced dose to 25 mg in the morning and 50 mg at night for 1 month and then to 25 mg twice daily for another month and then to 25 mg at night only for the third month and then to stop unless there is increased recurrence of headaches.  She will continue on aspirin for stroke prevention and return for follow-up in the future in 3 months with my nurse practitioner Janett Billow or call earlier if necessary.

## 2021-06-10 NOTE — Progress Notes (Signed)
[facility] [address] [place]. [place] [id number] [phone]       OFFICE FOLLOW UP NOTE  [name] Date of Birth:  12-08-1941 Medical Record Number:  [id number]   Reason for Referral:  Hospital follow up    SUBJECTIVE:    Initial visit 02/25/2021 :( Frann Rider, NP ) [name] is a 80 y.o. female with hx of RA and HTN who presented on 01/27/2021 with an episode of double vision with floaters in her eyes, followed by circumoral numbness, left hand numbness and tingling along with trouble working her TV remote. The episode lasted about an hour and resolved prior to coming to the ED. Endorses low grade headache at the vertex for the last month, no prior history of migraine documented but reports history of headaches for several years nonetheless. Personally reviewed hospitalization pertinent progress notes, lab work and imaging with summary provided. Evaluated by Dr. Leonie Man with stroke work-up largely unremarkable and likely TIA vs complicated migrainous type headache episode and less likely inflammatory process such as GCA.  CTA head/neck unremarkable.  MRI negative.  Initiated topiramate 25 mg twice daily for headache prophylaxis.  Due to possible TIA, initiated aspirin 81 mg daily.  Resumed home dose Cozaar for HTN management.  LDL 112.  A1c 5.3.  Other stroke risk factors include advanced age and EtOH use but no prior stroke history.  She was discharged home in stable condition without therapy needs.  Since discharge, she continues to experience migraines frequently but severity greatly decreased.  She has not had any additional auras or neurological symptoms.  Typically located right frontal temporal but occasionally tightness sensation behind her eyes.  She does endorse that she remembers her mother frequently complaining of headaches but unsure if she was diagnosed with migraines.  She has remained on topiramate 25 mg twice daily -reports  initially felt dizzy but subsided after couple days.  She also reports altered taste and decreased appetite since initiating  She has remained on aspirin 81 mg daily for stroke prevention -denies bleeding or bruising Blood pressure today 158/86 -routinely monitor at home which has been stable  No further concerns at this time   Update 06/10/2021: Patient states she is doing well.  She has brought her headache diary along and she has shown a good response to Topamax.  Initially she was having 1-2 headache episodes a month but for the last 6 weeks she has had no headaches.  She has had no further episodes of visual disturbance either.  She is on Topamax 50 mg twice daily and does complain of some alteration of taste as well as some occasional tingling of her extremities which is nonbothersome.  Her primary care physician has increased her blood pressure medications and blood pressure seems also quite well controlled.  She complains of feeling sleepy despite sleeping well at night.  She has an appointment to see a rheumatologist next week and plans to discuss this.  She has no new complaints.   ROS:   14 system review of systems performed and negative with exception of those listed in HPI  PMH:  Past Medical History:  Diagnosis Date   Dermatitis    History of breast cancer    Hypertension    Rheumatoid arthritis ([place])    Tachycardia     PSH:  Past Surgical History:  Procedure Laterality Date   ABDOMINAL HYSTERECTOMY     BREAST LUMPECTOMY Left 1998   NEUROMA SURGERY Right 03/2020  WEDGE RESECTION      Social History:  Social History   Socioeconomic History   Marital status: Married    Spouse name: Not on file   Number of children: Not on file   Years of education: Not on file   Highest education level: Not on file  Occupational History   Not on file  Tobacco Use   Smoking status: Never   Smokeless tobacco: Never  Vaping Use   Vaping Use: Never used  Substance and Sexual  Activity   Alcohol use: Yes    Comment: Socially    Drug use: Never   Sexual activity: Not on file  Other Topics Concern   Not on file  Social History Narrative   Lives with husband in home   Right Handed   Drinks coffee for caffeine   Social Determinants of Health   Financial Resource Strain: Not on file  Food Insecurity: Not on file  Transportation Needs: Not on file  Physical Activity: Not on file  Stress: Not on file  Social Connections: Not on file  Intimate Partner Violence: Not on file    Family History:  Family History  Problem Relation Age of Onset   Heart attack Father    Kidney disease Brother    Cancer Brother     Medications:   Current Outpatient Medications on File Prior to Visit  Medication Sig Dispense Refill   aspirin 81 MG chewable tablet Chew 1 tablet (81 mg total) by mouth daily. 30 tablet 12   cholecalciferol (VITAMIN D3) 25 MCG (1000 UNIT) tablet Take 1,000 Units by mouth daily.     Etanercept (ENBREL MINI) 50 MG/ML SOCT Inject 50 mg into the skin once a week. 12 mL 0   losartan (COZAAR) 100 MG tablet Take 100 mg by mouth daily.     pantoprazole (PROTONIX) 20 MG tablet Take 20 mg by mouth daily.     No current facility-administered medications on file prior to visit.    Allergies:   Allergies  Allergen Reactions   Contrast Media [Iodinated Diagnostic Agents] Hives   Latex Other (See Comments)    Ask pt and enter   Leflunomide    Methotrexate Derivatives    Metronidazole Nausea And Vomiting and Other (See Comments)    Ask pt and enter    Sulfamethoxazole-Trimethoprim Nausea And Vomiting and Other (See Comments)    Ask pt and enter       OBJECTIVE:  Physical Exam  Vitals:   06/10/21 1316  BP: (!) 144/93  Pulse: 83  Weight: 146 lb (66.2 kg)  Height: 5\' 2"  (1.575 m)   Body mass index is 26.7 kg/m. No results found.  General: Frail,  very pleasant elderly Caucasian female, seated, in no evident distress Head: head  normocephalic and atraumatic.   Neck: supple with no carotid or supraclavicular bruits Cardiovascular: regular rate and rhythm, no murmurs Musculoskeletal: no deformity Skin:  no rash/petichiae Vascular:  Normal pulses all extremities   Neurologic Exam Mental Status: Awake and fully alert.   Fluent speech and language.  Oriented to place and time. Recent and remote memory intact. Attention span, concentration and fund of knowledge appropriate. Mood and affect appropriate.  Cranial Nerves: Fundoscopic exam not done. Pupils equal, briskly reactive to light. Extraocular movements full without nystagmus. Visual fields full to confrontation. Hearing intact. Facial sensation intact. Face, tongue, palate moves normally and symmetrically.  Motor: Normal bulk and tone. Normal strength in all tested extremity muscles Sensory.: intact  to touch , pinprick , position and vibratory sensation.  Coordination: Rapid alternating movements normal in all extremities. Finger-to-nose and heel-to-shin performed accurately bilaterally. Gait and Station: Arises from chair without difficulty. Stance is normal. Gait demonstrates normal stride length and balance with without use of assistive device. Tandem walk and heel toe with mild difficulty.  Reflexes: 1+ and symmetric. Toes downgoing.     NIHSS  0 Modified Rankin  0      ASSESSMENT: Victoria Sharp is a 80 y.o. year old female presented on 01/27/2021 with 1 month of increasing headaches with some visual symptoms and transient episode of perioral and bilateral hand paresthesias likely in setting of complicated migraine more likely given negative brain imaging studies although TIA within differential diagnosis.  Vascular risk factors include HTN advanced age and EtOH use.  She has had excellent response to Topamax and has been headache and episode free for more than 6 weeks but does complain of alteration of taste and mild tingling which may be potential side  effects    PLAN:  I had a long discussion with the patient regarding her atypical/ complicated migraine episodes which seem to respond quite well to Topamax she is essentially been headache free for more than 6 weeks.  I recommend tapering trial of Topamax and reduced dose to 25 mg in the morning and 50 mg at night for 1 month and then to 25 mg twice daily for another month and then to 25 mg at night only for the third month and then to stop unless there is increased recurrence of headaches.  .Continue aspirin 81 mg daily for stroke prevention.  Discussed secondary stroke prevention measures and importance of close PCP follow up for aggressive stroke risk factor management including HTN with BP goal<130/90.She will  follow-up in the future in 3 months with my nurse practitioner Janett Billow or call earlier if necessary        CC:  [place] provider: Dr. Leonie Man PCP: [name]., MD    I spent 25 minutes of face-to-face and non-face-to-face time with patient.  This included previsit chart review including recent hospitalization pertinent progress notes, lab work and imaging, lab review, study review, order entry, electronic health record documentation, patient education regarding recent hospitalization complicated migraine versus possible TIA, further migraine management and treatment options, possible TIA and importance of managing stroke risk factors and answered all other questions to patient [place], [facility] [address] [place], [place] [address]  Phone [phone] Fax [phone] Note: This document was prepared with digital dictation and possible smart phrase technology. Any transcriptional errors that result from this process are unintentional.

## 2021-06-10 NOTE — Progress Notes (Signed)
Office Visit Note  Patient: Victoria Sharp             Date of Birth: 07/24/1941           MRN: [id number]             PCP: [name]., MD Referring: [name].,* Visit Date: 06/24/2021 Occupation: @GUAROCC @  Subjective:  Medication management   History of Present Illness: Victoria Sharp is a 80 y.o. female with a history of rheumatoid arthritis and osteoarthritis.  She states she was reverse-angle evaluated by Dr. Louanne Skye for lower back pain.  She is also scheduled to have injections by Dr. Ernestina Patches.  She continues to have some lower back discomfort.  She has been tolerating Enbrel and taking weekly injections.  She has not noticed any flare of rheumatoid arthritis.  She is some stiffness in her joints due to osteoarthritis.  Activities of Daily Living:  Patient reports morning stiffness for 1 hour.   Patient Reports nocturnal pain.  Difficulty dressing/grooming: Denies Difficulty climbing stairs: Reports Difficulty getting out of chair: Reports Difficulty using hands for taps, buttons, cutlery, and/or writing: Denies  Review of Systems  Constitutional:  Positive for fatigue.  HENT:  Negative for mouth sores, mouth dryness and nose dryness.   Eyes:  Positive for dryness. Negative for pain and itching.  Respiratory:  Negative for shortness of breath and difficulty breathing.   Cardiovascular:  Negative for chest pain and palpitations.  Gastrointestinal:  Negative for blood in stool, constipation and diarrhea.  Endocrine: Negative for increased urination.  Genitourinary:  Negative for difficulty urinating.  Musculoskeletal:  Positive for gait problem, myalgias, morning stiffness, muscle tenderness and myalgias. Negative for joint pain, joint pain and joint swelling.  Skin:  Positive for color change. Negative for rash and redness.  Allergic/Immunologic: Negative for susceptible to infections.  Neurological:  Positive for numbness. Negative for  dizziness, headaches, memory loss and weakness.  Hematological:  Negative for bruising/bleeding tendency.  Psychiatric/Behavioral:  Negative for confusion.    PMFS History:  Patient Active Problem List   Diagnosis Date Noted   TIA (transient ischemic attack) 01/27/2021   Rheumatoid arthritis with rheumatoid factor of multiple sites without organ or systems involvement ([place]) 06/26/2020   Essential hypertension 06/26/2020   MVP (mitral valve prolapse) 06/26/2020   History of Barrett's esophagus 06/26/2020   History of gastroesophageal reflux (GERD) 06/26/2020   History of breast cancer 06/26/2020   High risk medication use 06/26/2020    Past Medical History:  Diagnosis Date   Dermatitis    History of breast cancer    Hypertension    Rheumatoid arthritis ([place])    Tachycardia     Family History  Problem Relation Age of Onset   Heart attack Father    Kidney disease Brother    Cancer Brother    Past Surgical History:  Procedure Laterality Date   ABDOMINAL HYSTERECTOMY     BREAST LUMPECTOMY Left 1998   NEUROMA SURGERY Right 03/2020   WEDGE RESECTION     Social History   Social History Narrative   Lives with husband in home   Right Handed   Drinks coffee for caffeine   Immunization History  Administered Date(s) Administered   PFIZER(Purple Top)SARS-COV-2 Vaccination 01/09/2020, 01/30/2020, 09/11/2020     Objective: Vital Signs: BP 130/88 (BP Location: Left Arm, Patient Position: Sitting, Cuff Size: Normal)   Pulse 73   Resp 13   Ht 5\' 2"  (1.575 m)  Wt 149 lb 3.2 oz (67.7 kg)   BMI 27.29 kg/m    Physical Exam Vitals and nursing note reviewed.  Constitutional:      Appearance: She is well-developed.  HENT:     Head: Normocephalic and atraumatic.  Eyes:     Conjunctiva/sclera: Conjunctivae normal.  Cardiovascular:     Rate and Rhythm: Normal rate and regular rhythm.     Heart sounds: Normal heart sounds.  Pulmonary:     Effort: Pulmonary effort is normal.      Breath sounds: Normal breath sounds.  Abdominal:     General: Bowel sounds are normal.     Palpations: Abdomen is soft.  Musculoskeletal:     Cervical back: Normal range of motion.  Lymphadenopathy:     Cervical: No cervical adenopathy.  Skin:    General: Skin is warm and dry.     Capillary Refill: Capillary refill takes less than 2 seconds.  Neurological:     Mental Status: She is alert and oriented to person, place, and time.  Psychiatric:        Behavior: Behavior normal.     Musculoskeletal Exam: C-spine was in good range of motion.  She has lumbar scoliosis.  She has some discomfort range of motion of her lumbar spine.  Shoulder joints, elbow joints, wrist joints with good range of motion with no synovitis.  She had no synovitis over MCP joints.  PIP and DIP thickening was noted.  Hip joints and knee joints were in good range of motion.  She had no tenderness over ankles or MTPs.  CDAI Exam: CDAI Score: 0  Patient Global: 0 mm; Provider Global: 0 mm Swollen: 0 ; Tender: 0  Joint Exam 06/24/2021   No joint exam has been documented for this visit   There is currently no information documented on the homunculus. Go to the Rheumatology activity and complete the homunculus joint exam.  Investigation: No additional findings.  Imaging: XR C-ARM NO REPORT  Result Date: 06/07/2021 Please see Notes tab for imaging impression.  XR Lumbar Spine 2-3 Views  Result Date: 05/27/2021 Left lumbar scoliosis 11-12 degrees, grade 1 spondylolisthesis, mild DDD thoracolumbar spine and right SI joint sclerosis.    Recent Labs: Lab Results  Component Value Date   WBC 6.0 06/09/2021   HGB 14.4 06/09/2021   PLT 267 06/09/2021   NA 139 06/09/2021   K 4.2 06/09/2021   CL 107 06/09/2021   CO2 23 06/09/2021   GLUCOSE 88 06/09/2021   BUN 21 06/09/2021   CREATININE 1.01 (H) 06/09/2021   BILITOT 0.7 06/09/2021   ALKPHOS 65 01/27/2021   AST 18 06/09/2021   ALT 11 06/09/2021   PROT 7.1  06/09/2021   ALBUMIN 4.3 01/27/2021   CALCIUM 9.7 06/09/2021   GFRAA 62 06/09/2021   QFTBGOLDPLUS NEGATIVE 06/09/2021    Speciality Comments: Methotrexate-diarrhea, Arava-dermatitis  Procedures:  No procedures performed Allergies: Contrast media [iodinated diagnostic agents], Latex, Leflunomide, Methotrexate derivatives, Metronidazole, and Sulfamethoxazole-trimethoprim   Assessment / Plan:     Visit Diagnoses: Rheumatoid arthritis with rheumatoid factor of multiple sites without organ or systems involvement ([place]) - Positive RF, + anti-CCP, + ANA, + synovitis:(Arava 10mg  po qd- dcd due to dermatitis) (previously on methotrexate at low dose but d/c due to diarrhea).  She is clinically doing well on Enbrel 50 mg subcu weekly.  She has not had a flare in more than a year.  We discussed spacing Enbrel 50 mg subcu every 10 days.  If she has a flare we can reduce the interval again.  She was in agreement.  High risk medication use - Enbrel 50 mg sq injections once weekly-started 09/24/20.   Labs obtained on June 09, 2021 were stable.  TB gold was negative on June 09, 2021.  She has been advised to hold Enbrel in case she develops an infection and resume the medications when the infection resolves.  She is fully vaccinated against COVID-19.  A fourth dose (booster) was also discussed.  Recommendation regarding other vaccinations were also placed in the AVS.  Primary osteoarthritis of both hands-she has bilateral PIP and DIP thickening.  Joint protection muscle strengthening was discussed.  Primary osteoarthritis of both feet-she denies any discomfort in her feet.  Proper fitting shoes were discussed.  DDD (degenerative disc disease), lumbar-she has been followed by Dr. Louanne Skye now.  She has chronic lower back pain.  Other secondary scoliosis, lumbar region  Positive ANA (antinuclear antibody) - She has no clinical features of systemic lupus.  Osteopenia of multiple sites - March 11, 2021 DEXA AP  spine BMD 0.854 with T score -1.8.  No comparison available.  DEXA findings are discussed with the patient.  Calcium rich diet with total calcium intake of 1200 mg with vitamin D was discussed.  Regular exercise was emphasized.  Patient is limited in exercising currently due to lower back pain.  Vitamin D deficiency-she takes vitamin D 1000 units daily which she will continue.  Essential hypertension-blood pressure was normal today.  Increased risk of heart disease with the autoimmune disease was also discussed.  MVP (mitral valve prolapse)  History of gastroesophageal reflux (GERD)-she is on Protonix.  History of Barrett's esophagus  History of breast cancer  Orders: No orders of the defined types were placed in this encounter.  No orders of the defined types were placed in this encounter.    Follow-Up Instructions: Return in about 4 months (around 10/24/2021) for Rheumatoid arthritis, Osteoarthritis.   Bo Merino, MD  Note - This record has been created using Editor, commissioning.  Chart creation errors have been sought, but may not always  have been located. Such creation errors do not reflect on  the standard of medical care.

## 2021-06-12 LAB — QUANTIFERON-TB GOLD PLUS
Mitogen-NIL: 7.46 IU/mL
NIL: 0.05 IU/mL
QuantiFERON-TB Gold Plus: NEGATIVE
TB1-NIL: 0.05 IU/mL
TB2-NIL: 0.04 IU/mL

## 2021-06-12 LAB — COMPLETE METABOLIC PANEL WITH GFR
AG Ratio: 1.6 (calc) (ref 1.0–2.5)
ALT: 11 U/L (ref 6–29)
AST: 18 U/L (ref 10–35)
Albumin: 4.4 g/dL (ref 3.6–5.1)
Alkaline phosphatase (APISO): 78 U/L (ref 37–153)
BUN/Creatinine Ratio: 21 (calc) (ref 6–22)
BUN: 21 mg/dL (ref 7–25)
CO2: 23 mmol/L (ref 20–32)
Calcium: 9.7 mg/dL (ref 8.6–10.4)
Chloride: 107 mmol/L (ref 98–110)
Creat: 1.01 mg/dL — ABNORMAL HIGH (ref 0.60–0.93)
GFR, Est African American: 62 mL/min/{1.73_m2} (ref >=60)
GFR, Est Non African American: 53 mL/min/{1.73_m2} — ABNORMAL LOW (ref >=60)
Globulin: 2.7 g/dL (calc) (ref 1.9–3.7)
Glucose, Bld: 88 mg/dL (ref 65–99)
Potassium: 4.2 mmol/L (ref 3.5–5.3)
Sodium: 139 mmol/L (ref 135–146)
Total Bilirubin: 0.7 mg/dL (ref 0.2–1.2)
Total Protein: 7.1 g/dL (ref 6.1–8.1)

## 2021-06-12 LAB — CBC WITH DIFFERENTIAL/PLATELET
Absolute Monocytes: 510 cells/uL (ref 200–950)
Basophils Absolute: 30 cells/uL (ref 0–200)
Basophils Relative: 0.5 %
Eosinophils Absolute: 42 cells/uL (ref 15–500)
Eosinophils Relative: 0.7 %
HCT: 44.3 % (ref 35.0–45.0)
Hemoglobin: 14.4 g/dL (ref 11.7–15.5)
Lymphs Abs: 1668 cells/uL (ref 850–3900)
MCH: 30.4 pg (ref 27.0–33.0)
MCHC: 32.5 g/dL (ref 32.0–36.0)
MCV: 93.7 fL (ref 80.0–100.0)
MPV: 11.8 fL (ref 7.5–12.5)
Monocytes Relative: 8.5 %
Neutro Abs: 3750 cells/uL (ref 1500–7800)
Neutrophils Relative %: 62.5 %
Platelets: 267 10*3/uL (ref 140–400)
RBC: 4.73 10*6/uL (ref 3.80–5.10)
RDW: 12.4 % (ref 11.0–15.0)
Total Lymphocyte: 27.8 %
WBC: 6 10*3/uL (ref 3.8–10.8)

## 2021-06-14 NOTE — Progress Notes (Signed)
TB Gold is negative.

## 2021-06-21 ENCOUNTER — Other Ambulatory Visit: Payer: Self-pay

## 2021-06-21 DIAGNOSIS — M0579 Rheumatoid arthritis with rheumatoid factor of multiple sites without organ or systems involvement: Secondary | ICD-10-CM

## 2021-06-21 MED ORDER — ENBREL MINI 50 MG/ML ~~LOC~~ SOCT: 50.0000 mg | SUBCUTANEOUS | 0 refills | Status: DC

## 2021-06-21 NOTE — Telephone Encounter (Signed)
Patient called and requested refill of enbrel to be sent to [facility].   Next Visit: 06/24/2021  Last Visit: 01/22/2021  Last Fill: 04/01/2021  IX:[id number] arthritis with rheumatoid factor of multiple sites without organ or systems involvement  Current Dose per office note on 01/22/2021: Enbrel 50 mg sq injections once weekly  Labs: 06/09/2021 CBC is normal.  Creatinine is mildly elevated and stable.  TB Gold: 06/09/2021 negative  Okay to refill enbrel?

## 2021-06-24 ENCOUNTER — Encounter: Payer: Self-pay | Admitting: Rheumatology

## 2021-06-24 ENCOUNTER — Ambulatory Visit (INDEPENDENT_AMBULATORY_CARE_PROVIDER_SITE_OTHER): Payer: Medicare Other | Admitting: Rheumatology

## 2021-06-24 ENCOUNTER — Other Ambulatory Visit: Payer: Self-pay

## 2021-06-24 VITALS — BP 130/88 | HR 73 | Resp 13 | Ht 62.0 in | Wt 149.2 lb

## 2021-06-24 DIAGNOSIS — M19072 Primary osteoarthritis, left ankle and foot: Secondary | ICD-10-CM

## 2021-06-24 DIAGNOSIS — M8589 Other specified disorders of bone density and structure, multiple sites: Secondary | ICD-10-CM

## 2021-06-24 DIAGNOSIS — M4156 Other secondary scoliosis, lumbar region: Secondary | ICD-10-CM

## 2021-06-24 DIAGNOSIS — M0579 Rheumatoid arthritis with rheumatoid factor of multiple sites without organ or systems involvement: Secondary | ICD-10-CM

## 2021-06-24 DIAGNOSIS — Z853 Personal history of malignant neoplasm of breast: Secondary | ICD-10-CM

## 2021-06-24 DIAGNOSIS — Z79899 Other long term (current) drug therapy: Secondary | ICD-10-CM

## 2021-06-24 DIAGNOSIS — M19071 Primary osteoarthritis, right ankle and foot: Secondary | ICD-10-CM | POA: Diagnosis not present

## 2021-06-24 DIAGNOSIS — E559 Vitamin D deficiency, unspecified: Secondary | ICD-10-CM

## 2021-06-24 DIAGNOSIS — I10 Essential (primary) hypertension: Secondary | ICD-10-CM

## 2021-06-24 DIAGNOSIS — M19041 Primary osteoarthritis, right hand: Secondary | ICD-10-CM

## 2021-06-24 DIAGNOSIS — M5136 Other intervertebral disc degeneration, lumbar region: Secondary | ICD-10-CM

## 2021-06-24 DIAGNOSIS — R768 Other specified abnormal immunological findings in serum: Secondary | ICD-10-CM

## 2021-06-24 DIAGNOSIS — I341 Nonrheumatic mitral (valve) prolapse: Secondary | ICD-10-CM

## 2021-06-24 DIAGNOSIS — Z8719 Personal history of other diseases of the digestive system: Secondary | ICD-10-CM

## 2021-06-24 DIAGNOSIS — M19042 Primary osteoarthritis, left hand: Secondary | ICD-10-CM

## 2021-06-24 NOTE — Patient Instructions (Addendum)
Standing Labs We placed an order today for your standing lab work.   Please have your standing labs drawn in September and every 3 months  If possible, please have your labs drawn 2 weeks prior to your appointment so that the provider can discuss your results at your appointment.  Please note that you may see your imaging and lab results in MyChart before we have reviewed them. We may be awaiting multiple results to interpret others before contacting you. Please allow our office up to 72 hours to thoroughly review all of the results before contacting the office for clarification of your results.  We have open lab daily: Monday through Thursday from 1:30-4:30 PM and Friday from 1:30-4:00 PM at the office of Dr. Branndon Tuite, [facility].   Please be advised, all patients with office appointments requiring lab work will take precedent over walk-in lab work.  If possible, please come for your lab work on Monday and Friday afternoons, as you may experience shorter wait times. The office is located at [address], [address], [place], [place] [address] No appointment is necessary.   Labs are drawn by [facility]. Please bring your co-pay at the time of your lab draw.  You may receive a bill from [facility] for your lab work.  If you wish to have your labs drawn at another location, please call the office 24 hours in advance to send orders.  If you have any questions regarding directions or hours of operation,  please call [phone].   As a reminder, please drink plenty of water prior to coming for your lab work. Thanks!   Vaccines You are taking a medication(s) that can suppress your immune system.  The following immunizations are recommended: Flu annually Covid-19  Td/Tdap (tetanus, diphtheria, pertussis) every 10 years Pneumonia (Prevnar 15 then Pneumovax 23 at least 1 year apart.  Alternatively, can take Prevnar 20 without needing additional dose) Shingrix (after age 50): 2  doses from 4 weeks to 6 months apart  Please check with your PCP to make sure you are up to date.   If you test POSITIVE for COVID19 and have MILD to MODERATE symptoms: First, call your PCP if you would like to receive COVID19 treatment AND Hold your medications during the infection and for at least 1 week after your symptoms have resolved: Injectable medication (Benlysta, Cimzia, Cosentyx, Enbrel, Humira, Orencia, Remicade, Simponi, Stelara, Taltz, Tremfya) Methotrexate Leflunomide (Arava) Mycophenolate (Cellcept) Xeljanz, Olumiant, or Rinvoq If you take Actemra or Kevzara, you DO NOT need to hold these for COVID19 infection.  If you test POSITIVE for COVID19 and have NO symptoms: First, call your PCP if you would like to receive COVID19 treatment AND Hold your medications for at least 10 days after the day that you tested positive Injectable medication (Benlysta, Cimzia, Cosentyx, Enbrel, Humira, Orencia, Remicade, Simponi, Stelara, Taltz, Tremfya) Methotrexate Leflunomide (Arava) Mycophenolate (Cellcept) Xeljanz, Olumiant, or Rinvoq If you take Actemra or Kevzara, you DO NOT need to hold these for COVID19 infection.  If you have signs or symptoms of an infection or start antibiotics: First, call your PCP for workup of your infection. Hold your medication through the infection, until you complete your antibiotics, and until symptoms resolve if you take the following: Injectable medication (Actemra, Benlysta, Cimzia, Cosentyx, Enbrel, Humira, Kevzara, Orencia, Remicade, Simponi, Stelara, Taltz, Tremfya) Methotrexate Leflunomide (Arava) Mycophenolate (Cellcept) Xeljanz, Olumiant, or Rinvoq  Heart Disease Prevention   Your inflammatory disease increases your risk of heart disease which includes   heart attack, stroke, atrial fibrillation (irregular heartbeats), high blood pressure, heart failure and atherosclerosis (plaque in the arteries).  It is important to reduce your risk by:    Keep blood pressure, cholesterol, and blood sugar at healthy levels   Smoking Cessation   Maintain a healthy weight  BMI 20-25   Eat a healthy diet  Plenty of fresh fruit, vegetables, and whole grains  Limit saturated fats, foods high in sodium, and added sugars  DASH and Mediterranean diet   Increase physical activity  Recommend moderate physically activity for 150 minutes per week/ 30 minutes a day for five days a week These can be broken up into three separate ten-minute sessions during the day.   Reduce Stress  Meditation, slow breathing exercises, yoga, coloring books  Dental visits twice a year   

## 2021-06-25 ENCOUNTER — Ambulatory Visit (INDEPENDENT_AMBULATORY_CARE_PROVIDER_SITE_OTHER): Payer: Medicare Other | Admitting: Specialist

## 2021-06-25 ENCOUNTER — Encounter: Payer: Self-pay | Admitting: Specialist

## 2021-06-25 VITALS — BP 152/88 | HR 66 | Ht 62.0 in | Wt 146.0 lb

## 2021-06-25 DIAGNOSIS — M4316 Spondylolisthesis, lumbar region: Secondary | ICD-10-CM

## 2021-06-25 DIAGNOSIS — M48062 Spinal stenosis, lumbar region with neurogenic claudication: Secondary | ICD-10-CM

## 2021-06-25 DIAGNOSIS — M533 Sacrococcygeal disorders, not elsewhere classified: Secondary | ICD-10-CM | POA: Diagnosis not present

## 2021-06-25 NOTE — Progress Notes (Signed)
Office Visit Note   Patient: Victoria Sharp           Date of Birth: [birth year]           MRN: [id number] Visit Date: 06/25/2021              Requested by: [name]., MD [address] [place],  [place] [address] PCP: [name]., MD   Assessment & Plan: Visit Diagnoses:  1. Sacroiliac joint disease   2. Pain of right sacroiliac joint   3. Spondylolisthesis, lumbar region   4. Spinal stenosis of lumbar region with neurogenic claudication     Plan: Avoid bending, stooping and avoid lifting weights greater than 10 lbs. Avoid prolong standing and walking. Avoid frequent bending and stooping  No lifting greater than 10 lbs. May use ice or moist heat for pain. Weight loss is of benefit.  Dr. Romona Curls secretary/Assistant will call to arrange for right SI joint radiofrequency ablation.   Follow-Up Instructions: No follow-ups on file.   Orders:  Orders Placed This Encounter  Procedures   Ambulatory referral to Physical Medicine Rehab   No orders of the defined types were placed in this encounter.     Procedures: No procedures performed   Clinical Data: No additional findings.   Subjective: Chief Complaint  Patient presents with   Lower Back - Pain, Follow-up    80 year old with history of right buttock pain and tailbone pain. She underwent right SI joint injection with relief for about 1-2 days. She is concerned that the pain is hip also and there is a feeling of leg weakness with standing and walking the right leg fatigues. She does not tolerate a stationary bike due to it irritating the right posterior hip of buttock.    Review of Systems  Constitutional: Negative.   HENT: Negative.    Eyes: Negative.   Respiratory: Negative.    Cardiovascular: Negative.   Gastrointestinal: Negative.   Endocrine: Negative.   Genitourinary: Negative.   Musculoskeletal: Negative.   Skin: Negative.   Allergic/Immunologic: Negative.   Neurological:  Negative.   Hematological: Negative.   Psychiatric/Behavioral: Negative.      Objective: Vital Signs: BP (!) 152/88   Pulse 66   Ht 5\' 2"  (1.575 m)   Wt 146 lb (66.2 kg)   BMI 26.70 kg/m   Physical Exam Constitutional:      Appearance: She is well-developed.  HENT:     Head: Normocephalic and atraumatic.  Eyes:     Pupils: Pupils are equal, round, and reactive to light.  Pulmonary:     Effort: Pulmonary effort is normal.     Breath sounds: Normal breath sounds.  Abdominal:     General: Bowel sounds are normal.     Palpations: Abdomen is soft.  Musculoskeletal:     Cervical back: Normal range of motion and neck supple.     Lumbar back: Negative right straight leg raise test and negative left straight leg raise test.  Skin:    General: Skin is warm and dry.  Neurological:     Mental Status: She is alert and oriented to person, place, and time.  Psychiatric:        Behavior: Behavior normal.        Thought Content: Thought content normal.        Judgment: Judgment normal.    Back Exam   Tenderness  The patient is experiencing tenderness in the lumbar and sacroiliac.  Range of Motion  Extension:  abnormal  Flexion:  abnormal  Lateral bend right:  abnormal  Lateral bend left:  abnormal  Rotation right:  abnormal  Rotation left:  abnormal   Muscle Strength  Left Quadriceps:  5/5  Right Hamstrings:  5/5  Left Hamstrings:  5/5   Tests  Straight leg raise right: negative Straight leg raise left: negative  Reflexes  Patellar:  2/4 Achilles:  2/4 Biceps:  2/4  Other  Toe walk: normal Heel walk: normal Sensation: normal Erythema: no back redness Scars: absent     Specialty Comments:  No specialty comments available.  Imaging: No results found.   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 organ or systems  involvement ([place]) 06/26/2020   Essential hypertension 06/26/2020   MVP (mitral valve prolapse) 06/26/2020   History of Barrett's esophagus 06/26/2020   History of gastroesophageal reflux (GERD) 06/26/2020   History of breast cancer 06/26/2020   High risk medication use 06/26/2020   Past Medical History:  Diagnosis Date   Dermatitis    History of breast cancer    Hypertension    Rheumatoid arthritis ([place])    Tachycardia     Family History  Problem Relation Age of Onset   Heart attack Father    Kidney disease Brother    Cancer Brother     Past Surgical History:  Procedure Laterality Date   ABDOMINAL HYSTERECTOMY     BREAST LUMPECTOMY Left 1998   NEUROMA SURGERY Right 03/2020   WEDGE RESECTION     Social History   Occupational History   Not on file  Tobacco Use   Smoking status: Never   Smokeless tobacco: Never  Vaping Use   Vaping Use: Never used  Substance and Sexual Activity   Alcohol use: Not Currently    Comment: Socially    Drug use: Never   Sexual activity: Not on file

## 2021-06-25 NOTE — Patient Instructions (Signed)
Plan: Avoid bending, stooping and avoid lifting weights greater than 10 lbs. Avoid prolong standing and walking. Avoid frequent bending and stooping  No lifting greater than 10 lbs. May use ice or moist heat for pain. Weight loss is of benefit.  Dr. Romona Curls secretary/Assistant will call to arrange for right SI joint radiofrequency ablation.

## 2021-07-07 ENCOUNTER — Telehealth: Payer: Self-pay | Admitting: Rheumatology

## 2021-07-07 NOTE — Telephone Encounter (Signed)
Patient is on Enbrel. Patient advised she should hold her Enbrel until she has completed the antibiotic and the infection has cleared. Patient advised she will want to get clearance that the infection has cleared prior to resuming Enbrel. Patient expressed understanding.

## 2021-07-07 NOTE — Telephone Encounter (Signed)
Patient has been put on an antiboitic Doxycycline Mono 100 mg, 1 cap daily for 30 days. Patient taking medication for an eye infection. Patient calling to see if she needs to do anything different with MTX? Please call to advise.

## 2021-07-12 ENCOUNTER — Encounter: Payer: Self-pay | Admitting: Physical Medicine and Rehabilitation

## 2021-07-12 ENCOUNTER — Ambulatory Visit: Payer: Self-pay

## 2021-07-12 ENCOUNTER — Ambulatory Visit (INDEPENDENT_AMBULATORY_CARE_PROVIDER_SITE_OTHER): Payer: Medicare Other | Admitting: Physical Medicine and Rehabilitation

## 2021-07-12 ENCOUNTER — Other Ambulatory Visit: Payer: Self-pay

## 2021-07-12 VITALS — BP 150/87 | HR 68

## 2021-07-12 DIAGNOSIS — M461 Sacroiliitis, not elsewhere classified: Secondary | ICD-10-CM | POA: Diagnosis not present

## 2021-07-12 NOTE — Progress Notes (Signed)
Victoria Sharp - 80 y.o. female MRN [id number]  Date of birth: 1941/04/29  Office Visit Note: Visit Date: 07/12/2021 PCP: [name]., MD Referred by: [name].,*  Subjective: No chief complaint on file.  HPI:  Victoria Sharp is a 80 y.o. female who comes in today At the request of Dr. Basil Dess for repeat right sacroiliac joint injection with fluoroscopic guidance.  Patient is failed all manner of conservative care otherwise.  Last injection she did receive more than 60% relief for the first few days and she felt like it was excellent and then the symptoms started to return so that ultimately for a couple of weeks she got about 40% relief at that point.  Her symptoms have returned now in the right posterior buttock area.  Sit to stand and movement causes increasing pain.  Average pain is 4 out of 10.  We are going to repeat the injection today and a double block paradigm and look at potentially radiofrequency ablation.  As of note the patient does have a contrast media allergy with hives.  We were able to use a small bit of contrast on the last injection without any difficulty.  ROS Otherwise per HPI.  Assessment & Plan: Visit Diagnoses:    ICD-10-CM   1. Sacroiliitis (HCC)  M46.1 Sacroiliac Joint Inj    XR C-ARM NO REPORT      Plan: No additional findings.   Meds & Orders: No orders of the defined types were placed in this encounter.   Orders Placed This Encounter  Procedures   Sacroiliac Joint Inj   XR C-ARM NO REPORT    Follow-up: Return for Review Pain Diary.   Procedures: Sacroiliac Joint Inj on 07/12/2021 9:47 AM Indications: pain and diagnostic evaluation Details: 22 G 3.5 in needle, fluoroscopy-guided posterior approach Medications: 2 mL bupivacaine 0.5 %; 80 mg methylPREDNISolone acetate 80 MG/ML Outcome: tolerated well, no immediate complications  Sacroiliac Joint Intra-Articular Injection - Posterior Approach with Fluoroscopic  Guidance   Position: PRONE  Additional Comments: Vital signs were monitored before and after the procedure. Patient was prepped and draped in the usual sterile fashion. The correct patient, procedure, and site was verified.   Injection Procedure Details:   Location/Site:  Sacroiliac joint  Needle size: 3.5 in Spinal Needle  Needle type: Spinal  Needle Placement: Intra-articular  Findings:  -Comments: There was excellent flow of contrast producing a partial arthrogram of the sacroiliac joint.   Procedure Details: Starting with a 90 degree vertical and midline orientation the fluoroscope was tilted cranially 20 to 25 degrees and the target area of the inferior most part of the SI joint on the side mentioned above was visualized.  The soft tissues overlying this target were infiltrated with 4 ml. of 1% Lidocaine without Epinephrine. A #22 gauge spinal needle was inserted perpendicular to the fluoroscope table and advanced into the posterior inferior joint space using fluoroscopic guidance.  Position in the joint space was confirmed by obtaining a partial arthrogram using a 2 ml. volume of Isovue-250 contrast agent. After negative aspirate for gross pus or blood, the injectate was delivered to the joint. Radiographs were obtained for documentation purposes.   Additional Comments:   Dressing: Bandaid    Post-procedure details: Patient was observed during the procedure. Post-procedure instructions were reviewed.  Patient left the clinic in stable condition.    There was excellent flow of contrast producing a partial arthrogram of the sacroiliac joint.  Procedure, treatment alternatives,  risks and benefits explained, specific risks discussed. Consent was given by the patient. Immediately prior to procedure a time out was called to verify the correct patient, procedure, equipment, support staff and site/side marked as required. Patient was prepped and draped in the usual sterile  fashion.         Clinical History: No specialty comments available.     Objective:  VS:  HT:    WT:   BMI:     BP:(!) 150/87  HR:68bpm  TEMP: ( )  RESP:  Physical Exam Musculoskeletal:     Comments: Positive Fortin finger sign on the right.  Positive Patrick's.     Imaging: XR C-ARM NO REPORT  Result Date: 07/12/2021 Please see Notes tab for imaging impression.

## 2021-07-12 NOTE — Progress Notes (Signed)
Pt state right buttock pain. Pt state Laying, sitting, standing and walking makes the pain worse. Pt state cross her leg make the pain. Pt state she takes over the counter pain ed and uses heating to help ease her pain.  Numeric Pain Rating Scale and Functional Assessment Average Pain 4   In the last MONTH (on 0-10 scale) has pain interfered with the following?  1. General activity like being  able to carry out your everyday physical activities such as walking, climbing stairs, carrying groceries, or moving a chair?  Rating(8)   +Driver, -BT, -Dye Allergies.

## 2021-07-12 NOTE — Patient Instructions (Signed)

## 2021-07-13 MED ORDER — METHYLPREDNISOLONE ACETATE 80 MG/ML IJ SUSP
80.0000 mg | INTRAMUSCULAR | Status: AC | PRN
  Administered 2021-07-12: 80 mg via INTRA_ARTICULAR

## 2021-07-13 MED ORDER — BUPIVACAINE HCL 0.5 % IJ SOLN
2.0000 mL | INTRAMUSCULAR | Status: AC | PRN
  Administered 2021-07-12: 2 mL via INTRA_ARTICULAR

## 2021-08-02 ENCOUNTER — Other Ambulatory Visit: Payer: Self-pay | Admitting: Specialist

## 2021-08-02 ENCOUNTER — Telehealth: Payer: Self-pay | Admitting: Specialist

## 2021-08-02 NOTE — Telephone Encounter (Signed)
Pt called wanting to make Dr.Nitka aware that from 8/4-8/7 she had very painful spasms; she would like a CB to see what Dr.Nitka thinks about the spasms. Pt mentioned having an ablation done but due to insurance she had to get a 2nd shot of cortisone first. Pt would like to know if she could possibly be seen sooner.   [phone]

## 2021-08-13 ENCOUNTER — Other Ambulatory Visit: Payer: Self-pay

## 2021-08-13 ENCOUNTER — Ambulatory Visit (INDEPENDENT_AMBULATORY_CARE_PROVIDER_SITE_OTHER): Payer: Medicare Other | Admitting: Specialist

## 2021-08-13 ENCOUNTER — Encounter: Payer: Self-pay | Admitting: Specialist

## 2021-08-13 VITALS — BP 170/83 | HR 67 | Ht 62.0 in | Wt 146.0 lb

## 2021-08-13 DIAGNOSIS — M48062 Spinal stenosis, lumbar region with neurogenic claudication: Secondary | ICD-10-CM | POA: Diagnosis not present

## 2021-08-13 DIAGNOSIS — M4316 Spondylolisthesis, lumbar region: Secondary | ICD-10-CM | POA: Diagnosis not present

## 2021-08-13 DIAGNOSIS — M533 Sacrococcygeal disorders, not elsewhere classified: Secondary | ICD-10-CM

## 2021-08-13 NOTE — Patient Instructions (Signed)
She has had good relief temporarily with right SI joint injections and her options remain consider surgical fusion of the right SI joint or Radiofrequency ablation of the nerves innervating the right SI joint to provide long term relief 1-2 years of pain relief. I think she is candidate for the RAF and I believe that she should consider this. NSAIDs for DJD. EMG/NCV can be done to assess for nerve condition due to lumbar disease but she is no longer having nerve like symptoms and the mechanical pain has return.

## 2021-08-13 NOTE — Progress Notes (Addendum)
Office Visit Note   Patient: Victoria Sharp           Date of Birth: 12-23-1941           MRN: [id number] Visit Date: 08/13/2021              Requested by: [name]., MD [address] [place],  [place] [address] PCP: [name]., MD   Assessment & Plan: Visit Diagnoses:  1. Sacroiliac joint disease   2. Spondylolisthesis, lumbar region   3. Spinal stenosis of lumbar region with neurogenic claudication   80 year old female with right buttock and SI pain. Injections of the right SI joint give temporary relief of pain but her pain is not specific for the right SI joint. She is not wanting to consider lumbar intervention and her symptoms and response to right SI joint injection suggest that SI joint arthrosis is a significant cause of her right buttock pain. I recommend that she have RFA of the right SI joint to provide a longer lasting relief of the right SI joint pain. Eventually she may need to reconsider whether the lumbar condition should be treated.   Plan: She has had good relief temporarily with right SI joint injections and her options remain consider surgical fusion of the right SI joint or Radiofrequency ablation of the nerves innervating the right SI joint to provide long term relief 1-2 years of pain relief. I think she is candidate for the RAF and I believe that she should consider this. NSAIDs for DJD. EMG/NCV can be done to assess for nerve condition due to lumbar disease but she is no longer having nerve like symptoms and the mechanical pain has return.  Follow-Up Instructions: Return in about 6 weeks (around 09/24/2021).   Orders:  No orders of the defined types were placed in this encounter.  No orders of the defined types were placed in this encounter.     Procedures: No procedures performed   Clinical Data: No additional findings.   Subjective: Chief Complaint  Patient presents with   Right Leg - Follow-up   Lower Back - Follow-up     80 year old female with right posterior pelvis pain and has had injection of the right SI joint by Dr. Ernestina Patches and this helped for a couple of days then the pain return and was uncomfortable 7/2 through the 7/9 and she was having associated spasm in the right buttock with sciatica like pain down the posterior thigh and calf without foot pain. There is some tingling. That pain has since improved and overall she reports that it is feeling better. She has now completed the 2 steroid injections before consideration of RAF of the right SI joint her insurance company recommends. No bowel or bladder difficulty.    Review of Systems  Constitutional: Negative.   HENT: Negative.    Eyes: Negative.   Respiratory: Negative.    Cardiovascular: Negative.   Gastrointestinal: Negative.   Endocrine: Negative.   Genitourinary: Negative.   Musculoskeletal: Negative.   Skin: Negative.   Allergic/Immunologic: Negative.   Neurological: Negative.   Hematological: Negative.   Psychiatric/Behavioral: Negative.      Objective: Vital Signs: BP (!) 170/83   Pulse 67   Ht '5\' 2"'$  (1.575 m)   Wt 146 lb (66.2 kg)   BMI 26.70 kg/m   Physical Exam Constitutional:      Appearance: She is well-developed.  HENT:     Head: Normocephalic and  atraumatic.  Eyes:     Pupils: Pupils are equal, round, and reactive to light.  Pulmonary:     Effort: Pulmonary effort is normal.     Breath sounds: Normal breath sounds.  Abdominal:     General: Bowel sounds are normal.     Palpations: Abdomen is soft.  Musculoskeletal:        General: Normal range of motion.     Cervical back: Normal range of motion and neck supple.     Lumbar back: Negative right straight leg raise test and negative left straight leg raise test.  Skin:    General: Skin is warm and dry.  Neurological:     Mental Status: She is alert and oriented to person, place, and time.  Psychiatric:        Behavior: Behavior normal.        Thought Content:  Thought content normal.        Judgment: Judgment normal.   Back Exam   Tenderness  The patient is experiencing tenderness in the sacroiliac.  Range of Motion  Extension:  normal  Flexion:  normal  Lateral bend right:  normal  Lateral bend left:  normal  Rotation right:  normal  Rotation left:  normal   Muscle Strength  Right Quadriceps:  5/5  Left Quadriceps:  5/5  Right Hamstrings:  5/5  Left Hamstrings:  5/5   Tests  Straight leg raise right: negative Straight leg raise left: negative  Reflexes  Patellar:  1/4 Achilles:  1/4  Other  Toe walk: normal Heel walk: normal Sensation: normal Gait: antalgic  Erythema: no back redness Scars: absent  Comments:  Palpation or the right SI is tender, right figure of 4 testing is not replicative of pain but direct pressure over the right SI joint causes replication of pain.     Specialty Comments:  No specialty comments available.  Imaging: No results found.   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 organ or systems involvement ([place]) 06/26/2020   Essential hypertension 06/26/2020   MVP (mitral valve prolapse) 06/26/2020   History of Barrett's esophagus 06/26/2020   History of gastroesophageal reflux (GERD) 06/26/2020   History of breast cancer 06/26/2020   High risk medication use 06/26/2020   Past Medical History:  Diagnosis Date   Dermatitis    History of breast cancer    Hypertension    Rheumatoid arthritis ([place])    Tachycardia     Family History  Problem Relation Age of Onset   Heart attack Father    Kidney disease Brother    Cancer Brother     Past Surgical History:  Procedure Laterality Date   ABDOMINAL HYSTERECTOMY     BREAST LUMPECTOMY Left 1998   NEUROMA SURGERY Right 03/2020   WEDGE RESECTION     Social History   Occupational History   Not on  file  Tobacco Use   Smoking status: Never   Smokeless tobacco: Never  Vaping Use   Vaping Use: Never used  Substance and Sexual Activity   Alcohol use: Not Currently    Comment: Socially    Drug use: Never   Sexual activity: Not on file

## 2021-08-26 ENCOUNTER — Telehealth: Payer: Self-pay | Admitting: Physical Medicine and Rehabilitation

## 2021-08-26 HISTORY — PX: ABLATION: SHX5711

## 2021-08-26 MED ORDER — DIAZEPAM 5 MG PO TABS: ORAL_TABLET | ORAL | 0 refills | Status: DC

## 2021-08-26 NOTE — Telephone Encounter (Signed)
Patient would like Valium prior to her RFA on 9/7. [employer].

## 2021-08-26 NOTE — Telephone Encounter (Signed)
Pt called requesting a call back. Pt has questions about upcoming appt. Please call pt at [phone].

## 2021-09-01 ENCOUNTER — Other Ambulatory Visit: Payer: Self-pay

## 2021-09-01 ENCOUNTER — Encounter: Payer: Self-pay | Admitting: Physical Medicine and Rehabilitation

## 2021-09-01 ENCOUNTER — Ambulatory Visit (INDEPENDENT_AMBULATORY_CARE_PROVIDER_SITE_OTHER): Payer: Medicare Other | Admitting: Physical Medicine and Rehabilitation

## 2021-09-01 ENCOUNTER — Ambulatory Visit: Payer: Self-pay

## 2021-09-01 VITALS — BP 146/80 | HR 86

## 2021-09-01 DIAGNOSIS — M461 Sacroiliitis, not elsewhere classified: Secondary | ICD-10-CM | POA: Diagnosis not present

## 2021-09-01 MED ORDER — BETAMETHASONE SOD PHOS & ACET 6 (3-3) MG/ML IJ SUSP
12.0000 mg | Freq: Once | INTRAMUSCULAR | Status: AC
  Administered 2021-09-01: 12 mg

## 2021-09-01 NOTE — Progress Notes (Signed)
Pt state lower back pain that travels to her right hip. Pt state sitting and getting out of bed makes the pain worse. Pt state when standing at the stink or clean house she feels the pain. Pt state when laying in bed it hard for her to turn over. Pt state she takes over the counter pain meds and pain creamto help ease her pain  Numeric Pain Rating Scale and Functional Assessment Average Pain 4   In the last MONTH (on 0-10 scale) has pain interfered with the following?  1. General activity like being  able to carry out your everyday physical activities such as walking, climbing stairs, carrying groceries, or moving a chair?  Rating(8)   +Driver, -BT, -Dye Allergies.

## 2021-09-01 NOTE — Progress Notes (Signed)
Victoria Sharp - 80 y.o. female MRN [id number]  Date of birth: 02-23-1941  Office Visit Note: Visit Date: 09/01/2021 PCP: [name]., MD Referred by: [name].,*  Subjective: Chief Complaint  Patient presents with   Lower Back - Pain   Right Hip - Pain   HPI:  Victoria Sharp is a 80 y.o. female who comes in today for planned radiofrequency ablation of the Right sacroiliac joint via the lateral branches of L5, S1 and S2. This would be ablation of the corresponding lateral branches and/or dorsal rami.  Patient has had double diagnostic blocks with more than 50% relief.  These are documented on pain diary.  They have had chronic back pain for quite some time, more than 3 months, which has been an ongoing situation with recalcitrant axial back pain.  They have no radicular pain.  Their pain is below the waist and they do have a positive Fortin finger sign and positive Patrick's testing.  They have had physical therapy as well as home exercise program.   Accordingly they meet all the criteria and qualification for for radiofrequency ablation and we are going to complete this today hopefully for more longer term relief as part of comprehensive management program.   ROS Otherwise per HPI.  Assessment & Plan: Visit Diagnoses:    ICD-10-CM   1. Sacroiliitis (HCC)  M46.1 XR C-ARM NO REPORT    Radiofrequency,Lumbar    betamethasone acetate-betamethasone sodium phosphate (CELESTONE) injection 12 mg      Plan: No additional findings.   Meds & Orders:  Meds ordered this encounter  Medications   betamethasone acetate-betamethasone sodium phosphate (CELESTONE) injection 12 mg    Orders Placed This Encounter  Procedures   Radiofrequency,Lumbar   XR C-ARM NO REPORT    Follow-up: Return for visit to requesting physician as needed.   Procedures: No procedures performed  Sacroiliac Joint Peripheral Nerve Denervation - Posterior Approach with Fluoroscopic  Guidance   Patient: Victoria Sharp      Date of Birth: [birth year] MRN: [id number] PCP: [name]., MD      Visit Date: 09/01/2021   Universal Protocol:    Date/Time: 09/17/224:33 PM  Consent Given By: the patient  Position: PRONE  Additional Comments: Vital signs were monitored before and after the procedure. Patient was prepped and draped in the usual sterile fashion. The correct patient, procedure, and site was verified.   Injection Procedure Details:  Procedure Site One Meds Administered:  Meds ordered this encounter  Medications   betamethasone acetate-betamethasone sodium phosphate (CELESTONE) injection 12 mg     Laterality: Right  Location/Site: Lateral branches L5, S1 and S2 L5-S1 S1-2 S2-3  Needle size: 18 G  Needle type: Cosman/[employer] radiofrequency cannula  Findings:    -Comments: Good biplanar imaging showing cannula localization with good motor stimulation.  Procedure Details: For the L5, S1 and S2 dorsal rami the fluoroscope was positioned to square off the sacrum or sacral ala to achieve a true AP midline view.  For the L5 dorsal rami the beam was then obliqued 15 to 20 degrees and caudally tilted 15 to 20 degrees to line up a trajectory along the target nerve. The skin over the target of the junction of superior articulating process and sacral ala was infiltrated with local anesthetic.  The 18 gauge 44m active tip outer cannula was advanced in trajectory view to the target. The outer cannula placement was fine-tuned and the position was then confirmed with  bi-planar imaging.  For the S1 and S2 dorsal rami, no obliquity was utilized but the same caudal tilt was used.  Three targets corresponding to the 12 o'clock and 6 o'clock and the lateral mid-line positions around the foramen were  visualized. The 18 gauge 36m active tip outer cannula was advanced in trajectory view to each target. The outer cannula placement was fine-tuned  and the position was then confirmed with bi-planar imaging  This procedure was repeated for each target nerve position.  Then, for all levels, the outer cannula placement was fine-tuned and the position was then confirmed with bi-planar imaging.    Test stimulation was done both at sensory and motor levels to ensure there was no radicular stimulation. The target tissues were then infiltrated with 1 ml of 1% Lidocaine without Epinephrine. Subsequently, a percutaneous neurotomy was carried out for90 seconds at 80 degrees Celsius.  One 60 second neurotomy was performed for each target around the S1 and S2 foramen. After the completion of the respective lesions, 1 ml of injectate was delivered.  Appropriate radiographs were obtained to verify the probe placement during the neurotomy.  Additional Comments:  No complications occurred Dressing: Band-Aid    Post-procedure details: Patient was observed during the procedure. Post-procedure instructions were reviewed. Follow-Up Instructions: Given as patient handout Patient left the clinic in stable condition.      Clinical History: 05/27/21 Lumbar x-ray  Left lumbar scoliosis 11-12 degrees, grade 1 spondylolisthesis, mild DDD  thoracolumbar spine and right SI joint sclerosis.   JBasil Dess MD     Objective:  VS:  HT:    WT:   BMI:     BP:(!) 146/80  HR:86bpm  TEMP: ( )  RESP:  Physical Exam Vitals and nursing note reviewed.  Constitutional:      General: She is not in acute distress.    Appearance: Normal appearance. She is not ill-appearing.  HENT:     Head: Normocephalic and atraumatic.     Right Ear: External ear normal.     Left Ear: External ear normal.  Eyes:     Extraocular Movements: Extraocular movements intact.  Cardiovascular:     Rate and Rhythm: Normal rate.     Pulses: Normal pulses.  Pulmonary:     Effort: Pulmonary effort is normal. No respiratory distress.  Abdominal:     General: There is no distension.      Palpations: Abdomen is soft.  Musculoskeletal:        General: Tenderness present.     Cervical back: Neck supple.     Right lower leg: No edema.     Left lower leg: No edema.     Comments: Patient has good distal strength with no pain over the greater trochanters.  No clonus or focal weakness.  Positive right Fortin finger sign positive Patrick's testing.  Skin:    Findings: No erythema, lesion or rash.  Neurological:     General: No focal deficit present.     Mental Status: She is alert and oriented to person, place, and time.     Sensory: No sensory deficit.     Motor: No weakness or abnormal muscle tone.     Coordination: Coordination normal.  Psychiatric:        Mood and Affect: Mood normal.        Behavior: Behavior normal.     Imaging: No results found.

## 2021-09-01 NOTE — Patient Instructions (Signed)

## 2021-09-06 ENCOUNTER — Telehealth: Payer: Self-pay | Admitting: Specialist

## 2021-09-06 NOTE — Telephone Encounter (Signed)
Patient is calling asking about being able to exercise more, I advised that she should take it easy with increasing her exercises, she is wanting to walk further and she will maintain the 10 pound lifting restrictions. She has an appt on 09/24/21

## 2021-09-06 NOTE — Telephone Encounter (Signed)
Pt would like a phone call about if some of the restrictions can be lifted that Nitka told her after her abrasion.   CB [phone]

## 2021-09-09 ENCOUNTER — Ambulatory Visit: Payer: Medicare Other | Admitting: Adult Health

## 2021-09-11 NOTE — Procedures (Signed)
Sacroiliac Joint Peripheral Nerve Denervation - Posterior Approach with Fluoroscopic Guidance   Patient: Victoria Sharp      Date of Birth: [birth year] MRN: [id number] PCP: [name]., MD      Visit Date: 09/01/2021   Universal Protocol:    Date/Time: 09/17/224:33 PM  Consent Given By: the patient  Position: PRONE  Additional Comments: Vital signs were monitored before and after the procedure. Patient was prepped and draped in the usual sterile fashion. The correct patient, procedure, and site was verified.   Injection Procedure Details:  Procedure Site One Meds Administered:  Meds ordered this encounter  Medications   betamethasone acetate-betamethasone sodium phosphate (CELESTONE) injection 12 mg     Laterality: Right  Location/Site: Lateral branches L5, S1 and S2 L5-S1 S1-2 S2-3  Needle size: 18 G  Needle type: Cosman/[employer] radiofrequency cannula  Findings:    -Comments: Good biplanar imaging showing cannula localization with good motor stimulation.  Procedure Details: For the L5, S1 and S2 dorsal rami the fluoroscope was positioned to square off the sacrum or sacral ala to achieve a true AP midline view.  For the L5 dorsal rami the beam was then obliqued 15 to 20 degrees and caudally tilted 15 to 20 degrees to line up a trajectory along the target nerve. The skin over the target of the junction of superior articulating process and sacral ala was infiltrated with local anesthetic.  The 18 gauge 24m active tip outer cannula was advanced in trajectory view to the target. The outer cannula placement was fine-tuned and the position was then confirmed with bi-planar imaging.  For the S1 and S2 dorsal rami, no obliquity was utilized but the same caudal tilt was used.  Three targets corresponding to the 12 o'clock and 6 o'clock and the lateral mid-line positions around the foramen were  visualized. The 18 gauge 155mactive tip outer cannula was  advanced in trajectory view to each target. The outer cannula placement was fine-tuned and the position was then confirmed with bi-planar imaging  This procedure was repeated for each target nerve position.  Then, for all levels, the outer cannula placement was fine-tuned and the position was then confirmed with bi-planar imaging.    Test stimulation was done both at sensory and motor levels to ensure there was no radicular stimulation. The target tissues were then infiltrated with 1 ml of 1% Lidocaine without Epinephrine. Subsequently, a percutaneous neurotomy was carried out for90 seconds at 80 degrees Celsius.  One 60 second neurotomy was performed for each target around the S1 and S2 foramen. After the completion of the respective lesions, 1 ml of injectate was delivered.  Appropriate radiographs were obtained to verify the probe placement during the neurotomy.  Additional Comments:  No complications occurred Dressing: Band-Aid    Post-procedure details: Patient was observed during the procedure. Post-procedure instructions were reviewed. Follow-Up Instructions: Given as patient handout Patient left the clinic in stable condition.

## 2021-09-24 ENCOUNTER — Ambulatory Visit: Payer: Medicare Other | Admitting: Specialist

## 2021-09-29 ENCOUNTER — Ambulatory Visit: Payer: Medicare Other | Admitting: Specialist

## 2021-09-30 ENCOUNTER — Other Ambulatory Visit: Payer: Self-pay

## 2021-09-30 ENCOUNTER — Encounter: Payer: Self-pay | Admitting: Surgery

## 2021-09-30 ENCOUNTER — Ambulatory Visit: Payer: Self-pay

## 2021-09-30 ENCOUNTER — Ambulatory Visit (INDEPENDENT_AMBULATORY_CARE_PROVIDER_SITE_OTHER): Payer: Medicare Other | Admitting: Surgery

## 2021-09-30 VITALS — BP 155/94 | HR 76

## 2021-09-30 DIAGNOSIS — M533 Sacrococcygeal disorders, not elsewhere classified: Secondary | ICD-10-CM | POA: Diagnosis not present

## 2021-09-30 DIAGNOSIS — M5416 Radiculopathy, lumbar region: Secondary | ICD-10-CM

## 2021-09-30 NOTE — Progress Notes (Signed)
Office Visit Note   Patient: Victoria Sharp           Date of Birth: 1941-09-10           MRN: [id number] Visit Date: 09/30/2021              Requested by: [name]., MD [place],  [place] [address] PCP: [name]., MD   Assessment & Plan: Visit Diagnoses:  1. Pain of right sacroiliac joint   2. Radiculopathy, lumbar region     Plan: Since patient is continuing to describe low back pain with right lower extremity radiculopathy I will order lumbar MRI to rule out HNP/stenosis.  I think she did have some improvement with right SI joint ablation.  Follow with Dr. Merrilee Seashore in a couple weeks to review the MRI results and discuss further treatment options.  Follow-Up Instructions: Return in about 2 weeks (around 10/14/2021) for with dr Louanne Skye to review lumbar mri.   Orders:  Orders Placed This Encounter  Procedures   MR LUMBAR SPINE WO CONTRAST   No orders of the defined types were placed in this encounter.     Procedures: No procedures performed   Clinical Data: No additional findings.   Subjective: Chief Complaint  Patient presents with   Lower Back - Pain, Follow-up    HPI 80 year old white female returns after having right SI joint RF ablation.  States that SI joint pain is greatly improved.  She continues to have right-sided low back pain that radiates into her buttock and down towards her foot.  No left-sided symptoms. Review of Systems No current cardiac pulmonary GI GU issues  Objective: Vital Signs: BP (!) 155/94   Pulse 76   Physical Exam Constitutional:      Appearance: Normal appearance.  HENT:     Nose: Nose normal.  Pulmonary:     Effort: No respiratory distress.  Musculoskeletal:     Comments: Gait is normal.  Nontender over the SI joints.  Positive right sciatic notch tenderness.  Negative straight leg raise.  No focal motor deficits.  Neurological:     General: No focal deficit present.     Mental Status: She  is alert and oriented to person, place, and time.    Ortho Exam  Specialty Comments:  No specialty comments available.  Imaging: No results found.   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 organ or systems involvement ([place]) 06/26/2020   Essential hypertension 06/26/2020   MVP (mitral valve prolapse) 06/26/2020   History of Barrett's esophagus 06/26/2020   History of gastroesophageal reflux (GERD) 06/26/2020   History of breast cancer 06/26/2020   High risk medication use 06/26/2020   Past Medical History:  Diagnosis Date   Dermatitis    History of breast cancer    Hypertension    Rheumatoid arthritis ([place])    Tachycardia     Family History  Problem Relation Age of Onset   Heart attack Father    Kidney disease Brother    Cancer Brother     Past Surgical History:  Procedure Laterality Date   ABDOMINAL HYSTERECTOMY     BREAST LUMPECTOMY Left 1998   NEUROMA SURGERY Right 03/2020   WEDGE RESECTION     Social History   Occupational History   Not on file  Tobacco Use   Smoking  status: Never   Smokeless tobacco: Never  Vaping Use   Vaping Use: Never used  Substance and Sexual Activity   Alcohol use: Not Currently    Comment: Socially    Drug use: Never   Sexual activity: Not on file

## 2021-10-01 ENCOUNTER — Other Ambulatory Visit (HOSPITAL_BASED_OUTPATIENT_CLINIC_OR_DEPARTMENT_OTHER): Payer: Self-pay | Admitting: Surgery

## 2021-10-01 DIAGNOSIS — M5416 Radiculopathy, lumbar region: Secondary | ICD-10-CM

## 2021-10-09 ENCOUNTER — Ambulatory Visit (HOSPITAL_BASED_OUTPATIENT_CLINIC_OR_DEPARTMENT_OTHER)
Admission: RE | Admit: 2021-10-09 | Discharge: 2021-10-09 | Disposition: A | Discharge status: Home / Self Care | Payer: Medicare Other | Source: Ambulatory Visit | Attending: Surgery | Admitting: Surgery

## 2021-10-09 ENCOUNTER — Other Ambulatory Visit: Payer: Self-pay

## 2021-10-09 DIAGNOSIS — M5416 Radiculopathy, lumbar region: Secondary | ICD-10-CM | POA: Diagnosis not present

## 2021-10-09 IMAGING — MR MR LUMBAR SPINE W/O CM
5 of 6 series · 28 of 48 positions shown · non-contrast
Comparison: None.

CLINICAL DATA: Low back pain, > 6 wks Lumbar radiculopathy, cancer
or infection suspected worsening back pain and right LE
radiculopathy

EXAM:
MRI LUMBAR SPINE WITHOUT CONTRAST
TECHNIQUE: Multiplanar, multisequence MR imaging of the lumbar spine was
performed. No intravenous contrast was administered.

[Series 2: T1 · sagittal · 4.0mm · 0.81mm/px · 6 of 16 slices shown (1 of 2)]
[im 1/16]
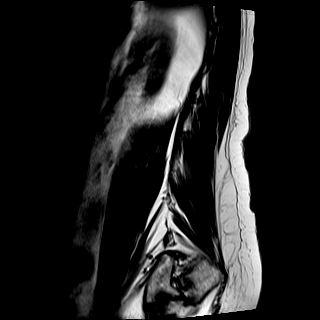
[im 4/16]
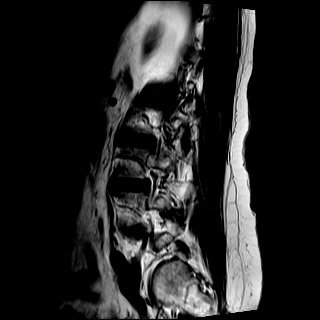
[im 7/16]
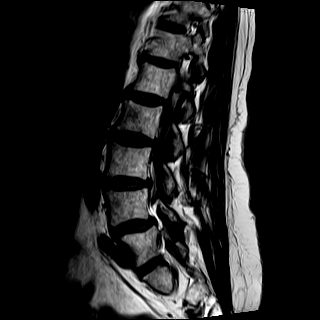
[im 10/16]
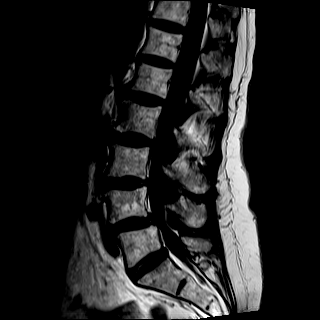
[im 13/16]
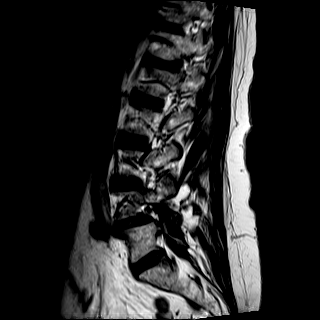
[im 16/16]
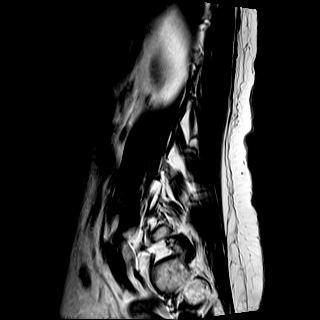

[Series 3: T2 · sagittal · 4.0mm · 0.81mm/px · 6 of 16 slices shown (1 of 3)]
[im 1/16]
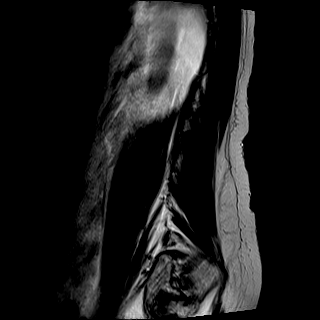
[im 4/16]
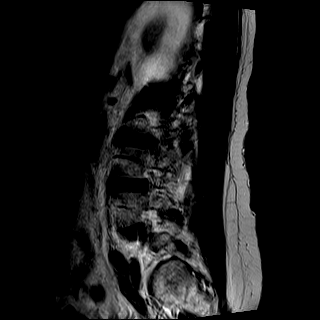
[im 7/16]
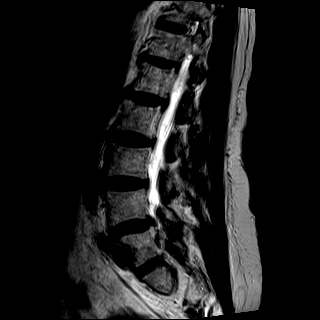
[im 10/16]
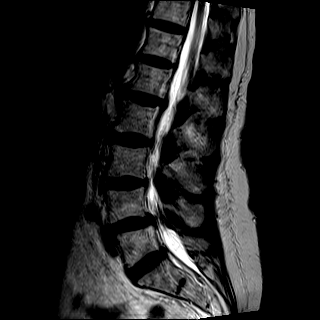
[im 13/16]
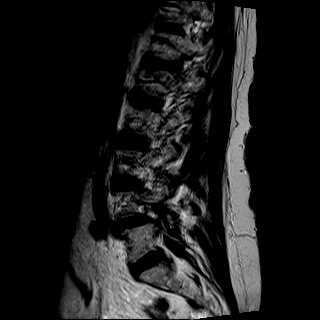
[im 16/16]
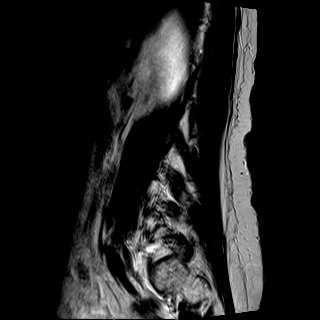

[Series 5: T2 · sagittal · 4.0mm · 0.81mm/px · 5 of 16 slices shown (2 of 3)]
[im 1/16]
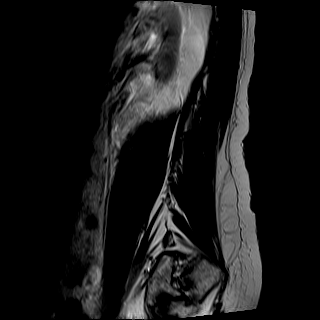
[im 4/16]
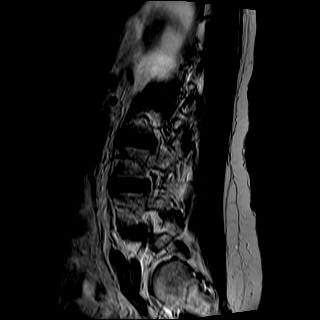
[im 8/16]
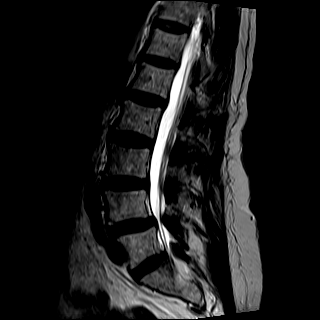
[im 12/16]
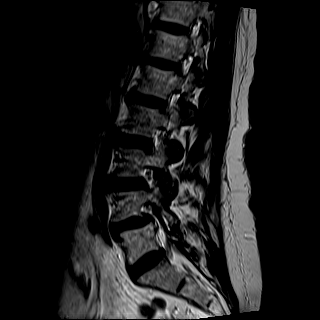
[im 16/16]
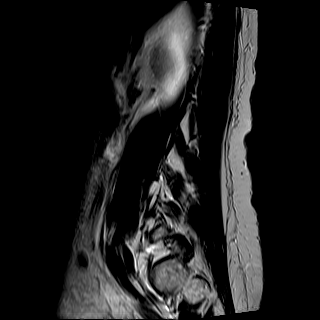

[Series 6: T2 · axial · 4.0mm · 0.39mm/px · z∈[-56,+139]mm · 9 of 40 slices shown (3 of 3)]
[im 1/40]
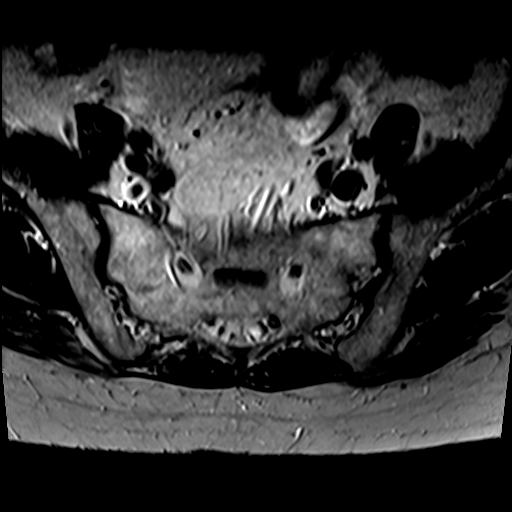
[im 7/40]
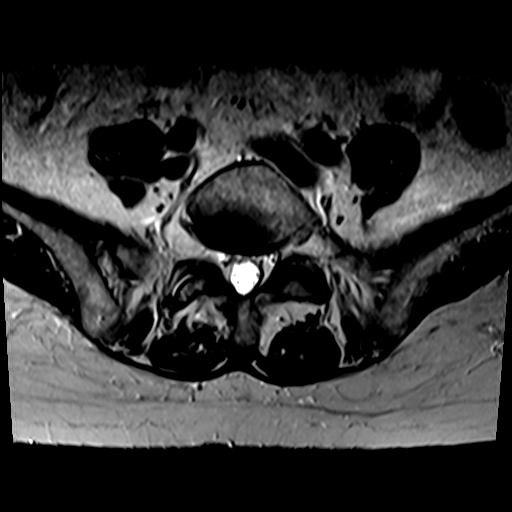
[im 14/40]
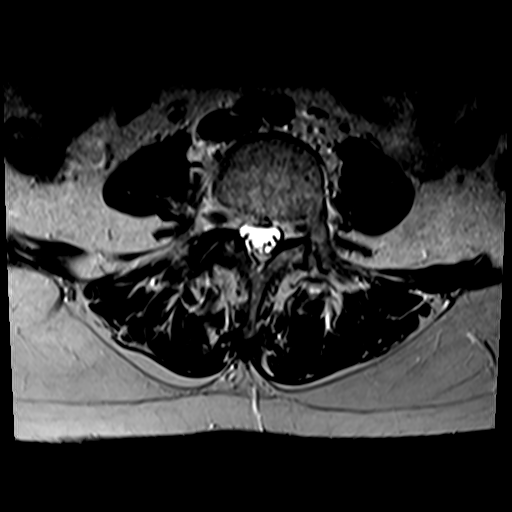
[im 17/40]
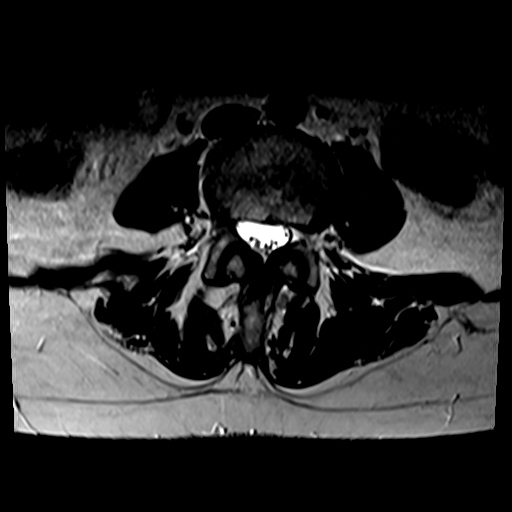
[im 20/40]
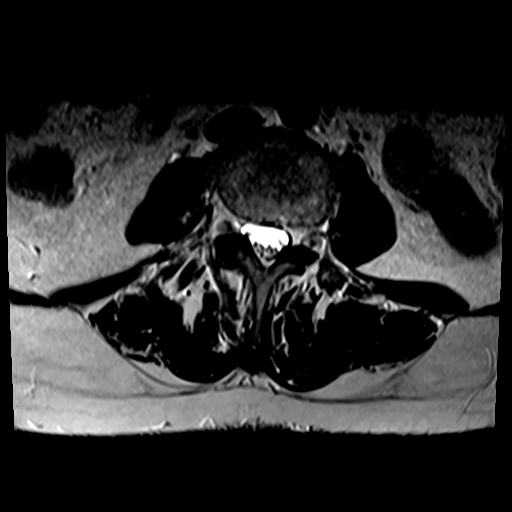
[im 23/40]
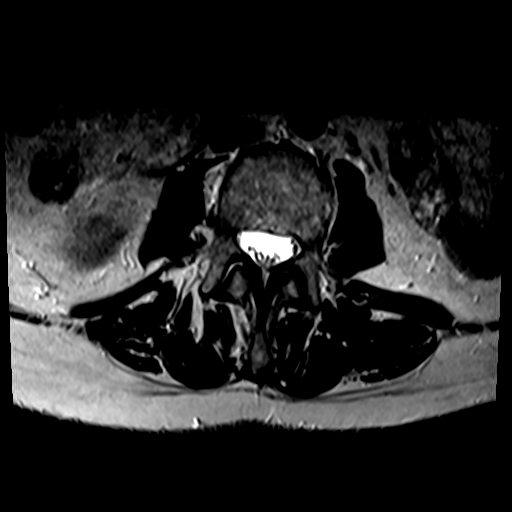
[im 27/40]
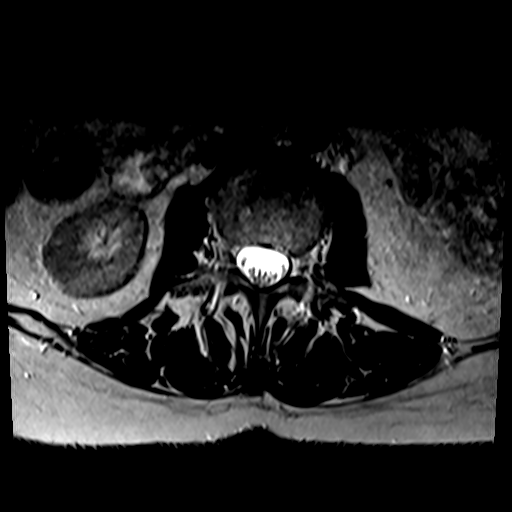
[im 33/40]
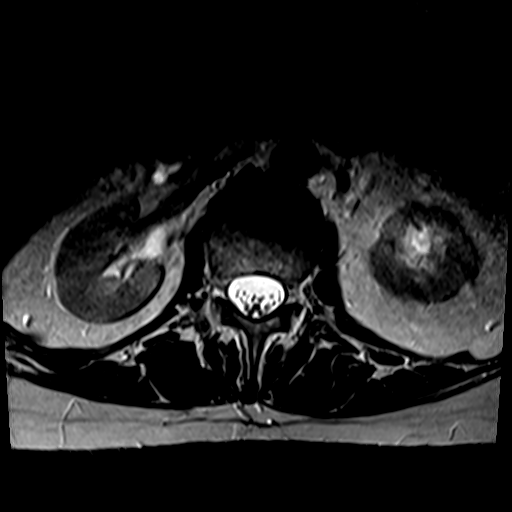
[im 40/40]
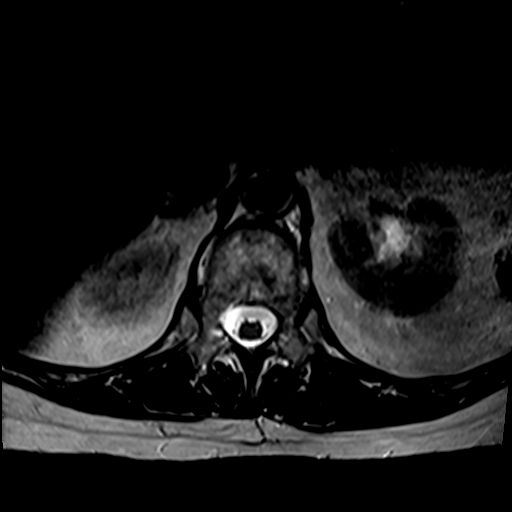

[Series 7: T1 · axial · 4.0mm · 0.39mm/px · z∈[-56,-26]mm · 2 of 40 slices shown (2 of 2)]
[im 1/40]
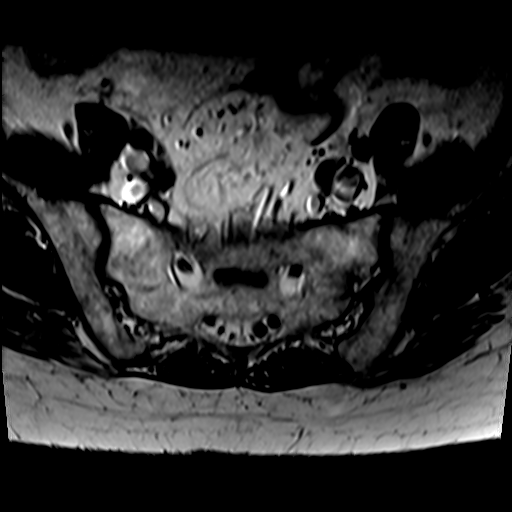
[im 7/40]
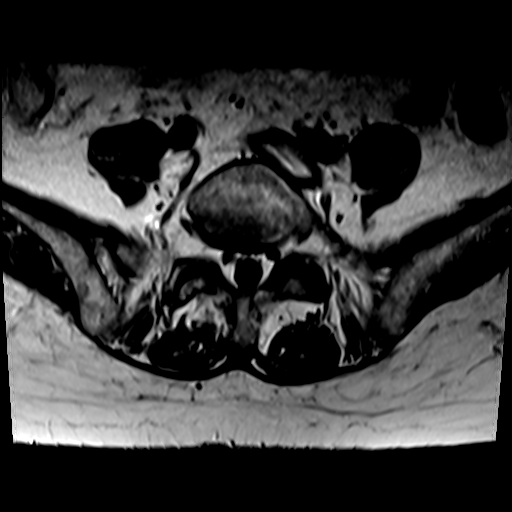

[28 of 48 positions shown; findings below may reference images not displayed]

FINDINGS: Segmentation: Standard segmentation is assumed. The the
inferior-most fully formed intervertebral disc labeled L5-S1.

Alignment:  Grade 1 anterolisthesis of L4 on L5.

Vertebrae: Vertebral body heights are maintained. No focal marrow
edema to suggest acute fracture discitis/osteomyelitis.

Conus medullaris and cauda equina: Conus extends to the L1-L2 level.
Conus appears normal.

Paraspinal and other soft tissues: Left parapelvic cysts.

Disc levels:

Motion limited evaluation.

T12-L1: No significant disc protrusion, foraminal stenosis, or canal
stenosis.

L1-L2: Mild disc bulging and mild bilateral facet arthropathy
without significant stenosis.

L2-L3: Mild disc bulging and mild bilateral facet arthropathy with
ligamentum flavum thickening. No significant stenosis.

L3-L4: Mild disc bulging, mild bilateral facet arthropathy, and
ligamentum flavum thickening. Mild canal and right foraminal
stenosis. No significant left foraminal stenosis.

L4-L5: Grade 1 anterolisthesis. Uncovering of the disc with
superimposed mild broad disc bulge. Moderate bilateral facet
arthropathy with small bilateral facet joint effusions. Ligamentum
flavum thickening. Resulting moderate canal stenosis with bilateral
subarticular recess narrowing. Moderate right foraminal stenosis
without significant left foraminal stenosis.

L5-S1: Mild disc bulging and mild bilateral facet arthropathy
without significant canal or foraminal stenosis.
IMPRESSION: 1. At L4-L5, moderate canal stenosis with bilateral subarticular
recess narrowing and moderate right foraminal stenosis. Grade 1
anterolisthesis at this level.
2. At L3-L4, mild canal and right foraminal stenosis.

## 2021-10-11 NOTE — Progress Notes (Signed)
Office Visit Note  Patient: Victoria Sharp             Date of Birth: 22-Jul-1941           MRN: [id number]             PCP: [name]., MD Referring: [name].,* Visit Date: 10/25/2021 Occupation: @GUAROCC @  Subjective:  Medication monitoring.   History of Present Illness: Victoria Sharp is a 80 y.o. female with a history of rheumatoid arthritis and osteoarthritis.  She started spacing her Enbrel to every 10 days after the last visit.  She has been on every 10-day dosing for at least 2 months now.  She states her course of Enbrel was interrupted due to an eye infection.  He denies any increased swelling on the decreased frequency off Enbrel.  She continues to have some lower back pain.  She had radiofrequency ablation recently for sciatica which was helpful.  She is scheduled to have epidural injection soon by Dr. Ernestina Patches.  Has osteoarthritis in her hands and feet.  She does not have much discomfort in her hands but her feet hurt at night especially.  Activities of Daily Living:  Patient reports morning stiffness for 1 hour.   Patient Reports nocturnal pain.  Difficulty dressing/grooming: Denies Difficulty climbing stairs: Denies Difficulty getting out of chair: Denies Difficulty using hands for taps, buttons, cutlery, and/or writing: Reports  Review of Systems  Constitutional:  Negative for fatigue.  HENT:  Negative for mouth sores, mouth dryness and nose dryness.   Eyes:  Negative for pain, itching and dryness.  Respiratory:  Negative for shortness of breath and difficulty breathing.   Cardiovascular:  Negative for chest pain and palpitations.  Gastrointestinal:  Negative for blood in stool, constipation and diarrhea.  Endocrine: Negative for increased urination.  Genitourinary:  Negative for difficulty urinating.  Musculoskeletal:  Positive for joint pain, joint pain, myalgias, morning stiffness and myalgias. Negative for joint swelling and muscle  tenderness.  Skin:  Negative for color change, rash, redness and sensitivity to sunlight.  Allergic/Immunologic: Positive for susceptible to infections.  Neurological:  Negative for dizziness, numbness, headaches, memory loss and weakness.  Hematological:  Negative for bruising/bleeding tendency.  Psychiatric/Behavioral:  Positive for sleep disturbance. Negative for depressed mood and confusion. The patient is not nervous/anxious.    PMFS History:  Patient Active Problem List   Diagnosis Date Noted   TIA (transient ischemic attack) 01/27/2021   Rheumatoid arthritis with rheumatoid factor of multiple sites without organ or systems involvement ([place]) 06/26/2020   Essential hypertension 06/26/2020   MVP (mitral valve prolapse) 06/26/2020   History of Barrett's esophagus 06/26/2020   History of gastroesophageal reflux (GERD) 06/26/2020   History of breast cancer 06/26/2020   High risk medication use 06/26/2020    Past Medical History:  Diagnosis Date   Dermatitis    History of breast cancer    Hypertension    Rheumatoid arthritis ([place])    Tachycardia     Family History  Problem Relation Age of Onset   Heart attack Father    Kidney disease Brother    Cancer Brother    Past Surgical History:  Procedure Laterality Date   ABDOMINAL HYSTERECTOMY     ABLATION  08/2021   Dr. Ernestina Patches   BREAST LUMPECTOMY Left 1998   NEUROMA SURGERY Right 03/2020   WEDGE RESECTION     Social History   Social History Narrative   Lives with husband in home  Right Handed   Drinks coffee for caffeine   Immunization History  Administered Date(s) Administered   PFIZER(Purple Top)SARS-COV-2 Vaccination 01/09/2020, 01/30/2020, 09/11/2020, 10/18/2021     Objective: Vital Signs: BP (!) 158/96 (BP Location: Left Arm, Patient Position: Sitting, Cuff Size: Normal)   Pulse 68   Ht 5\' 2"  (1.575 m)   Wt 153 lb 3.2 oz (69.5 kg)   BMI 28.02 kg/m    Physical Exam Vitals and nursing note reviewed.   Constitutional:      Appearance: She is well-developed.  HENT:     Head: Normocephalic and atraumatic.  Eyes:     Conjunctiva/sclera: Conjunctivae normal.  Cardiovascular:     Rate and Rhythm: Normal rate and regular rhythm.     Heart sounds: Normal heart sounds.  Pulmonary:     Effort: Pulmonary effort is normal.     Breath sounds: Normal breath sounds.  Abdominal:     General: Bowel sounds are normal.     Palpations: Abdomen is soft.  Musculoskeletal:     Cervical back: Normal range of motion.  Lymphadenopathy:     Cervical: No cervical adenopathy.  Skin:    General: Skin is warm and dry.     Capillary Refill: Capillary refill takes less than 2 seconds.  Neurological:     Mental Status: She is alert and oriented to person, place, and time.  Psychiatric:        Behavior: Behavior normal.     Musculoskeletal Exam: She had good range of motion of her cervical spine.  She had limited and painful range of motion of lumbar spine.  Shoulder joints, elbow joints, wrist joints, MCPs PIPs and DIPs with good range of motion with no synovitis.  Hip joints, knee joints, ankles, MTPs with good range of motion with no synovitis.  CDAI Exam: CDAI Score: 0.4  Patient Global: 2 mm; Provider Global: 2 mm Swollen: 0 ; Tender: 0  Joint Exam 10/25/2021   No joint exam has been documented for this visit   There is currently no information documented on the homunculus. Go to the Rheumatology activity and complete the homunculus joint exam.  Investigation: No additional findings.  Imaging: MR LUMBAR SPINE WO CONTRAST  Result Date: 10/10/2021 CLINICAL DATA:  Low back pain, > 6 wks Lumbar radiculopathy, cancer or infection suspected worsening back pain and right LE radiculopathy EXAM: MRI LUMBAR SPINE WITHOUT CONTRAST TECHNIQUE: Multiplanar, multisequence MR imaging of the lumbar spine was performed. No intravenous contrast was administered. COMPARISON:  None. FINDINGS: Segmentation: Standard  segmentation is assumed. The the inferior-most fully formed intervertebral disc labeled L5-S1. Alignment:  Grade 1 anterolisthesis of L4 on L5. Vertebrae: Vertebral body heights are maintained. No focal marrow edema to suggest acute fracture discitis/osteomyelitis. Conus medullaris and cauda equina: Conus extends to the L1-L2 level. Conus appears normal. Paraspinal and other soft tissues: Left parapelvic cysts. Disc levels: Motion limited evaluation. T12-L1: No significant disc protrusion, foraminal stenosis, or canal stenosis. L1-L2: Mild disc bulging and mild bilateral facet arthropathy without significant stenosis. L2-L3: Mild disc bulging and mild bilateral facet arthropathy with ligamentum flavum thickening. No significant stenosis. L3-L4: Mild disc bulging, mild bilateral facet arthropathy, and ligamentum flavum thickening. Mild canal and right foraminal stenosis. No significant left foraminal stenosis. L4-L5: Grade 1 anterolisthesis. Uncovering of the disc with superimposed mild broad disc bulge. Moderate bilateral facet arthropathy with small bilateral facet joint effusions. Ligamentum flavum thickening. Resulting moderate canal stenosis with bilateral subarticular recess narrowing. Moderate right foraminal stenosis without  significant left foraminal stenosis. L5-S1: Mild disc bulging and mild bilateral facet arthropathy without significant canal or foraminal stenosis. IMPRESSION: 1. At L4-L5, moderate canal stenosis with bilateral subarticular recess narrowing and moderate right foraminal stenosis. Grade 1 anterolisthesis at this level. 2. At L3-L4, mild canal and right foraminal stenosis. Electronically Signed   By: Margaretha Sheffield M.D.   On: 10/10/2021 19:52    Recent Labs: Lab Results  Component Value Date   WBC 4.4 10/21/2021   HGB 14.3 10/21/2021   PLT 221 10/21/2021   NA 139 10/21/2021   K 3.8 10/21/2021   CL 104 10/21/2021   CO2 27 10/21/2021   GLUCOSE 91 10/21/2021   BUN 21 10/21/2021    CREATININE 1.05 (H) 10/21/2021   BILITOT 0.6 10/21/2021   ALKPHOS 65 01/27/2021   AST 25 10/21/2021   ALT 15 10/21/2021   PROT 6.4 10/21/2021   ALBUMIN 4.3 01/27/2021   CALCIUM 9.3 10/21/2021   GFRAA 62 06/09/2021   QFTBGOLDPLUS NEGATIVE 06/09/2021    Speciality Comments: Methotrexate-diarrhea, Arava-dermatitis Enbrel 08/2020  Procedures:  No procedures performed Allergies: Contrast media [iodinated diagnostic agents], Latex, Leflunomide, Methotrexate derivatives, Metronidazole, and Sulfamethoxazole-trimethoprim   Assessment / Plan:     Visit Diagnoses: Rheumatoid arthritis with rheumatoid factor of multiple sites without organ or systems involvement (HCC) - Positive RF, + anti-CCP, + ANA, + synovitis:(Arava 10mg  po qd- dcd due to dermatitis) (previously on methotrexate at low dose but d/c due to diarrhea).  -She is doing much better on Enbrel.  She has spaced Enbrel 50 mg subcu every 10 days which she is tolerating well.  I will check sed rate with the next labs.  Plan: Etanercept (ENBREL MINI) 50 MG/ML SOCT, Sedimentation rate  High risk medication use - Enbrel 50 mg sq injections q 10 days since 08/2021, Enbrel-started 09/24/20.  Labs obtained on October 21, 2021 which included CBC with differential and CMP with GFR were within normal limits except creatinine being elevated at 1.05.  TB gold was negative on June 09, 2021.  We will check TB Gold next year in June.  Information regarding immunizations was placed in the AVS.  She was also advised to stop Enbrel in case she develops an infection and restart after the infection resolves.  She was advised to have yearly skin examination to screen for nonmelanoma skin cancer.  Side effects of Enbrel again reviewed.  If patient continues to do well we may be able to space Enbrel to every other week.  Primary osteoarthritis of both hands-she continues to have some stiffness in her hands.  No synovitis was noted.  Primary osteoarthritis of both  feet-she is off-and-on discomfort in her feet.  Proper fitting shoes were discussed.  DDD (degenerative disc disease), lumbar - she has been followed by Dr. Louanne Skye now.  Patient scheduled to have epidural injection soon.  She states that she had radiofrequency ablation which helped her with sciatica symptoms.  Other secondary scoliosis, lumbar region  Positive ANA (antinuclear antibody) - She has no clinical features of systemic lupus.  There is no history of Ro ulcers, nasal ulcers, malar rash, photosensitivity, Raynaud's phenomenon or lymphadenopathy.  Osteopenia of multiple sites -  March 11, 2021 DEXA AP spine BMD 0.854 with T score -1.8.  No comparison available.  Vitamin D deficiency -she has history of vitamin D deficiency.  We will check vitamin D level with the next labs.  Plan: VITAMIN D 25 Hydroxy (Vit-D Deficiency, Fractures)  MVP (mitral valve prolapse)  Essential hypertension-her blood pressure was elevated today.  She states she has been monitoring her blood pressure in the evening which is being normal.  I advised her to take her blood pressure twice a day 3 times a week and take those readings to her PCP.  History of Barrett's esophagus  History of gastroesophageal reflux (GERD)  History of breast cancer  Orders: Orders Placed This Encounter  Procedures   Sedimentation rate   VITAMIN D 25 Hydroxy (Vit-D Deficiency, Fractures)    Meds ordered this encounter  Medications   Etanercept (ENBREL MINI) 50 MG/ML SOCT    Sig: Inject 50mg  into the skin every 10 days.    Dispense:  12 mL    Refill:  0     Follow-Up Instructions: Return in about 5 months (around 03/25/2022) for Rheumatoid arthritis, Osteoarthritis.   Bo Merino, MD  Note - This record has been created using Editor, commissioning.  Chart creation errors have been sought, but may not always  have been located. Such creation errors do not reflect on  the standard of medical care.

## 2021-10-12 ENCOUNTER — Other Ambulatory Visit: Payer: Self-pay

## 2021-10-12 ENCOUNTER — Encounter: Payer: Self-pay | Admitting: Adult Health

## 2021-10-12 ENCOUNTER — Ambulatory Visit (INDEPENDENT_AMBULATORY_CARE_PROVIDER_SITE_OTHER): Payer: Medicare Other | Admitting: Adult Health

## 2021-10-12 VITALS — BP 152/84 | HR 89 | Ht 62.0 in | Wt 153.0 lb

## 2021-10-12 DIAGNOSIS — G43109 Migraine with aura, not intractable, without status migrainosus: Secondary | ICD-10-CM | POA: Diagnosis not present

## 2021-10-12 NOTE — Progress Notes (Signed)
[facility] [address] [place]. [place] [address] [phone]       OFFICE FOLLOW UP NOTE  [name] Date of Birth:  Jul 23, 1941 Medical Record Number:  [id number]   Reason for Referral:  Hospital follow up   Chief Complaint  Patient presents with   Follow-up    Rm 2 alone- Here for 3 month f/u. Pt reports she has been doing well. She has stopped the topamax ( 09/09/2021). Since stopping 3 mild headaches.       SUBJECTIVE:  Initial visit 02/25/2021 :( Victoria Rider, Victoria Sharp ) Victoria Sharp is a 80 y.o. female with hx of RA and HTN who presented on 01/27/2021 with an episode of double vision with floaters in her eyes, followed by circumoral numbness, left hand numbness and tingling along with trouble working her TV remote. The episode lasted about an hour and resolved prior to coming to the ED. Endorses low grade headache at the vertex for the last month, no prior history of migraine documented but reports history of headaches for several years nonetheless. Personally reviewed hospitalization pertinent progress notes, lab work and imaging with summary provided. Evaluated by Victoria Sharp with stroke work-up largely unremarkable and likely TIA vs complicated migrainous type headache episode and less likely inflammatory process such as GCA.  CTA head/neck unremarkable.  MRI negative.  Initiated topiramate 25 mg twice daily for headache prophylaxis.  Due to possible TIA, initiated aspirin 81 mg daily.  Resumed home dose Cozaar for HTN management.  LDL 112.  A1c 5.3.  Other stroke risk factors include advanced age and EtOH use but no prior stroke history.  She was discharged home in stable condition without therapy needs.  Since discharge, she continues to experience migraines frequently but severity greatly decreased.  She has not had any additional auras or neurological symptoms.  Typically located right frontal temporal but occasionally tightness sensation behind  her eyes.  She does endorse that she remembers her mother frequently complaining of headaches but unsure if she was diagnosed with migraines.  She has remained on topiramate 25 mg twice daily -reports initially felt dizzy but subsided after couple days.  She also reports altered taste and decreased appetite since initiating  She has remained on aspirin 81 mg daily for stroke prevention -denies bleeding or bruising Blood pressure today 158/86 -routinely monitor at home which has been stable  No further concerns at this time   Update 06/10/2021 Victoria Sharp: Patient states she is doing well.  She has brought her headache diary along and she has shown a good response to Topamax.  Initially she was having 1-2 headache episodes a month but for the last 6 weeks she has had no headaches.  She has had no further episodes of visual disturbance either.  She is on Topamax 50 mg twice daily and does complain of some alteration of taste as well as some occasional tingling of her extremities which is nonbothersome.  Her primary care physician has increased her blood pressure medications and blood pressure seems also quite well controlled.  She complains of feeling sleepy despite sleeping well at night.  She has an appointment to see a rheumatologist next week and plans to discuss this.  She has no new complaints.  Update 10/12/2021 Victoria Sharp: Returns for 37-month follow-up unaccompanied.  Overall doing well.  She had experienced 3 headaches since prior visit and discontinuing topiramate but these have been mild and she feels more due to weather changes. Reports improvement  of taste since discontinued topiramate.  Denies new or reoccurring stroke/TIA symptoms or neurological symptoms.  She does report occasional short lasting vertigo sensation when laying in bed and turns head quickly.  Does not experience any type of vertigo with position changes or head movements while standing.  Compliant on aspirin without side effects.  Blood  pressure today 152/84 - routinely monitors at home and typically stable.   Follows with ortho for low back issues with recent MRI completed and follow-up with Victoria Sharp tomorrow to further discuss.  Did have SI joint ablation approx 6 weeks ago with Victoria Sharp with some improvement but continues to experience RLE radiculopathy.   Continues to be followed by Victoria Sharp rheumatology for RA  No further concerns at this time       ROS:   14 system review of systems performed and negative with exception of those listed in HPI  PMH:  Past Medical History:  Diagnosis Date   Dermatitis    History of breast cancer    Hypertension    Rheumatoid arthritis ([place])    Tachycardia     PSH:  Past Surgical History:  Procedure Laterality Date   ABDOMINAL HYSTERECTOMY     BREAST LUMPECTOMY Left 1998   NEUROMA SURGERY Right 03/2020   WEDGE RESECTION      Social History:  Social History   Socioeconomic History   Marital status: Married    Spouse name: Not on file   Number of children: Not on file   Years of education: Not on file   Highest education level: Not on file  Occupational History   Not on file  Tobacco Use   Smoking status: Never   Smokeless tobacco: Never  Vaping Use   Vaping Use: Never used  Substance and Sexual Activity   Alcohol use: Not Currently    Comment: Socially    Drug use: Never   Sexual activity: Not on file  Other Topics Concern   Not on file  Social History Narrative   Lives with husband in home   Right Handed   Drinks coffee for caffeine   Social Determinants of Health   Financial Resource Strain: Not on file  Food Insecurity: Not on file  Transportation Needs: Not on file  Physical Activity: Not on file  Stress: Not on file  Social Connections: Not on file  Intimate Partner Violence: Not on file    Family History:  Family History  Problem Relation Age of Onset   Heart attack Father    Kidney disease Brother    Cancer Brother      Medications:   Current Outpatient Medications on File Prior to Visit  Medication Sig Dispense Refill   amLODipine (NORVASC) 2.5 MG tablet Take 2.5 mg by mouth daily.     aspirin 81 MG chewable tablet Chew 1 tablet (81 mg total) by mouth daily. 30 tablet 12   cholecalciferol (VITAMIN D3) 25 MCG (1000 UNIT) tablet Take 1,000 Units by mouth daily.     Etanercept (ENBREL MINI) 50 MG/ML SOCT Inject 50 mg into the skin once a week. 12 mL 0   losartan (COZAAR) 100 MG tablet Take 100 mg by mouth daily.     pantoprazole (PROTONIX) 20 MG tablet Take 20 mg by mouth daily.     No current facility-administered medications on file prior to visit.    Allergies:   Allergies  Allergen Reactions   Contrast Media [Iodinated Diagnostic Agents] Hives   Latex Other (  See Comments)    Ask pt and enter   Leflunomide    Methotrexate Derivatives    Metronidazole Nausea And Vomiting and Other (See Comments)    Ask pt and enter    Sulfamethoxazole-Trimethoprim Nausea And Vomiting and Other (See Comments)    Ask pt and enter       OBJECTIVE:  Physical Exam  Vitals:   10/12/21 0944  BP: (!) 152/84  Pulse: 89  SpO2: 94%  Weight: 153 lb (69.4 kg)  Height: 5\' 2"  (1.575 m)    Body mass index is 27.98 kg/m. No results found.  General: Very pleasant elderly Caucasian female, seated, in no evident distress Head: head normocephalic and atraumatic.   Neck: supple with no carotid or supraclavicular bruits Cardiovascular: regular rate and rhythm, no murmurs Skin:  no rash/petichiae Vascular:  Normal pulses all extremities   Neurologic Exam Mental Status: Awake and fully alert. Fluent speech and language. Oriented to place and time. Recent and remote memory intact. Attention span, concentration and fund of knowledge appropriate. Mood and affect appropriate.  Cranial Nerves: Pupils equal, briskly reactive to light. Extraocular movements full without nystagmus. Visual fields full to confrontation.  Hearing intact. Facial sensation intact. Face, tongue, palate moves normally and symmetrically.  Motor: Normal bulk and tone. Normal strength in all tested extremity muscles Sensory.: intact to touch , pinprick , position and vibratory sensation.  Coordination: Rapid alternating movements normal in all extremities. Finger-to-nose and heel-to-shin performed accurately bilaterally. Gait and Station: Arises from chair without difficulty. Stance is normal. Gait demonstrates normal stride length and balance with without use of assistive device. Tandem walk and heel toe with mild difficulty.  Reflexes: 1+ and symmetric. Toes downgoing.         ASSESSMENT: JEILY GUTHRIDGE is a 80 y.o. year old female presented on 01/27/2021 with 1 month of increasing headaches with some visual symptoms and transient episode of perioral and bilateral hand paresthesias likely in setting of complicated migraine more likely given negative brain imaging studies although TIA within differential diagnosis.  Vascular risk factors include HTN advanced age and EtOH use.    PLAN:  Minimal mild headaches (not migrainous type) since stopping topiramate.  Advised to continue to monitor and to call office with any reoccurring migraines Continue to follow with orthopedics and rheumatology as scheduled Continue aspirin 81 mg daily for stroke prevention Discussed secondary stroke prevention measures and importance of close PCP follow up for aggressive stroke risk factor management including HTN with BP goal<130/90.   Follow-up in 6 months or call earlier if needed    CC:  PCP: Victoria Sharp., MD    I spent 26 minutes of face-to-face and non-face-to-face time with patient.  This included previsit chart review, lab review, study review, electronic health record documentation, patient education regarding complicated migraine, possible TIA and stroke prevention measures, lower back issues with answering questions to the  best of my ability and answered all other questions to patient satisfaction  Victoria Sharp, [facility] [address] [place], [place] [address]  Phone [phone] Fax [phone] Note: This document was prepared with digital dictation and possible smart phrase technology. Any transcriptional errors that result from this process are unintentional.

## 2021-10-12 NOTE — Patient Instructions (Signed)
Continue aspirin 81 mg daily for secondary stroke prevention  Continue to follow up with PCP regarding blood pressure management  Maintain strict control of hypertension with blood pressure goal below 130/90  Continue to follow with orthopedics and rheumatology as scheduled     Followup in the future with me in 6 months or call earlier if needed       Thank you for coming to see [place] at [facility]. I hope we have been able to provide you high quality care today.  You may receive a patient satisfaction survey over the next few weeks. We would appreciate your feedback and comments so that we may continue to improve ourselves and the health of our patients.

## 2021-10-13 ENCOUNTER — Other Ambulatory Visit: Payer: Self-pay

## 2021-10-13 ENCOUNTER — Encounter: Payer: Self-pay | Admitting: Specialist

## 2021-10-13 ENCOUNTER — Ambulatory Visit (INDEPENDENT_AMBULATORY_CARE_PROVIDER_SITE_OTHER): Payer: Medicare Other | Admitting: Specialist

## 2021-10-13 VITALS — BP 157/74 | HR 79 | Ht 62.0 in | Wt 153.0 lb

## 2021-10-13 DIAGNOSIS — M48062 Spinal stenosis, lumbar region with neurogenic claudication: Secondary | ICD-10-CM

## 2021-10-13 DIAGNOSIS — M4316 Spondylolisthesis, lumbar region: Secondary | ICD-10-CM | POA: Diagnosis not present

## 2021-10-13 NOTE — Patient Instructions (Signed)
Plan: Avoid bending, stooping and avoid lifting weights greater than 10 lbs. Avoid prolong standing and walking. Avoid frequent bending and stooping  No lifting greater than 10 lbs. May use ice or moist heat for pain. Weight loss is of benefit. Handicap license is approved. Dr. Newton's secretary/Assistant will call to arrange for epidural steroid injection  

## 2021-10-13 NOTE — Progress Notes (Addendum)
   Office Visit Note   Patient: Victoria Sharp           Date of Birth: Jul 17, 1941           MRN: [id number] Visit Date: 10/13/2021              Requested by: [name]., MD [address] [place],  [place] [address] PCP: [name]., MD   Assessment & Plan: Visit Diagnoses:  1. Spondylolisthesis, lumbar region   2. Spinal stenosis of lumbar region with neurogenic claudication     Plan: Avoid bending, stooping and avoid lifting weights greater than 10 lbs. Avoid prolong standing and walking. Avoid frequent bending and stooping  No lifting greater than 10 lbs. May use ice or moist heat for pain. Weight loss is of benefit. Handicap license is approved. Dr. Romona Curls secretary/Assistant will call to arrange for epidural steroid injection    Follow-Up Instructions: No follow-ups on file.   Orders:  No orders of the defined types were placed in this encounter.  No orders of the defined types were placed in this encounter.     Procedures: No procedures performed   Clinical Data: No additional findings.   Subjective: Chief Complaint  Patient presents with  . Lower Back - Pain, Follow-up    S/p MRI Lsp w/o contrast    80 year old female returns today with persistent right thigh anterior pain and numbness and paresthesias. Her pain radiates to the right anterior shin. Worse with standing and walking and improves some with sitting. Had RFA of the facets at L4-5 with only mild to moderate pain improvement. Prior to RFA had  Review of Systems   Objective: Vital Signs: BP (!) 157/74 (BP Location: Left Arm, Patient Position: Sitting, Cuff Size: Normal)   Pulse 79   Ht 5\' 2"  (1.575 m)   Wt 153 lb (69.4 kg)   BMI 27.98 kg/m   Physical Exam  Ortho Exam  Specialty Comments:  No specialty comments available.  Imaging: No results found.   PMFS History: Patient Active Problem List   Diagnosis Date Noted  . TIA (transient ischemic attack)  01/27/2021  . Rheumatoid arthritis with rheumatoid factor of multiple sites without organ or systems involvement ([place]) 06/26/2020  . Essential hypertension 06/26/2020  . MVP (mitral valve prolapse) 06/26/2020  . History of Barrett's esophagus 06/26/2020  . History of gastroesophageal reflux (GERD) 06/26/2020  . History of breast cancer 06/26/2020  . High risk medication use 06/26/2020   Past Medical History:  Diagnosis Date  . Dermatitis   . History of breast cancer   . Hypertension   . Rheumatoid arthritis ([place])   . Tachycardia     Family History  Problem Relation Age of Onset  . Heart attack Father   . Kidney disease Brother   . Cancer Brother     Past Surgical History:  Procedure Laterality Date  . ABDOMINAL HYSTERECTOMY    . BREAST LUMPECTOMY Left 1998  . NEUROMA SURGERY Right 03/2020  . WEDGE RESECTION     Social History   Occupational History  . Not on file  Tobacco Use  . Smoking status: Never  . Smokeless tobacco: Never  Vaping Use  . Vaping Use: Never used  Substance and Sexual Activity  . Alcohol use: Not Currently    Comment: Socially   . Drug use: Never  . Sexual activity: Not on file

## 2021-10-15 ENCOUNTER — Telehealth: Payer: Self-pay

## 2021-10-15 DIAGNOSIS — F411 Generalized anxiety disorder: Secondary | ICD-10-CM

## 2021-10-15 NOTE — Telephone Encounter (Signed)
Pt has requested RX for appt. On 11/8/ [place].

## 2021-10-18 ENCOUNTER — Other Ambulatory Visit: Payer: Self-pay | Admitting: *Deleted

## 2021-10-18 ENCOUNTER — Telehealth: Payer: Self-pay

## 2021-10-18 DIAGNOSIS — M0579 Rheumatoid arthritis with rheumatoid factor of multiple sites without organ or systems involvement: Secondary | ICD-10-CM

## 2021-10-18 DIAGNOSIS — Z79899 Other long term (current) drug therapy: Secondary | ICD-10-CM

## 2021-10-18 NOTE — Telephone Encounter (Signed)
Lab Orders released.  

## 2021-10-18 NOTE — Telephone Encounter (Signed)
Patient left voicemail requesting her labwork orders be sent to [facility] on [address] in [place].  Patient states she plans to go on Wednesday, 10/20/21.  Patient requested a return call to let her know the orders have been sent.

## 2021-10-22 ENCOUNTER — Telehealth: Payer: Self-pay

## 2021-10-22 LAB — COMPLETE METABOLIC PANEL WITH GFR
AG Ratio: 1.8 (calc) (ref 1.0–2.5)
ALT: 15 U/L (ref 6–29)
AST: 25 U/L (ref 10–35)
Albumin: 4.1 g/dL (ref 3.6–5.1)
Alkaline phosphatase (APISO): 71 U/L (ref 37–153)
BUN/Creatinine Ratio: 20 (calc) (ref 6–22)
BUN: 21 mg/dL (ref 7–25)
CO2: 27 mmol/L (ref 20–32)
Calcium: 9.3 mg/dL (ref 8.6–10.4)
Chloride: 104 mmol/L (ref 98–110)
Creat: 1.05 mg/dL — ABNORMAL HIGH (ref 0.60–1.00)
Globulin: 2.3 g/dL (calc) (ref 1.9–3.7)
Glucose, Bld: 91 mg/dL (ref 65–139)
Potassium: 3.8 mmol/L (ref 3.5–5.3)
Sodium: 139 mmol/L (ref 135–146)
Total Bilirubin: 0.6 mg/dL (ref 0.2–1.2)
Total Protein: 6.4 g/dL (ref 6.1–8.1)
eGFR: 54 mL/min/{1.73_m2} — ABNORMAL LOW (ref >=60)

## 2021-10-22 LAB — CBC WITH DIFFERENTIAL/PLATELET
Absolute Monocytes: 748 cells/uL (ref 200–950)
Basophils Absolute: 31 cells/uL (ref 0–200)
Basophils Relative: 0.7 %
Eosinophils Absolute: 40 cells/uL (ref 15–500)
Eosinophils Relative: 0.9 %
HCT: 42.5 % (ref 35.0–45.0)
Hemoglobin: 14.3 g/dL (ref 11.7–15.5)
Lymphs Abs: 1720 cells/uL (ref 850–3900)
MCH: 31.1 pg (ref 27.0–33.0)
MCHC: 33.6 g/dL (ref 32.0–36.0)
MCV: 92.4 fL (ref 80.0–100.0)
MPV: 11.9 fL (ref 7.5–12.5)
Monocytes Relative: 17 %
Neutro Abs: 1861 cells/uL (ref 1500–7800)
Neutrophils Relative %: 42.3 %
Platelets: 221 10*3/uL (ref 140–400)
RBC: 4.6 10*6/uL (ref 3.80–5.10)
RDW: 11.8 % (ref 11.0–15.0)
Total Lymphocyte: 39.1 %
WBC: 4.4 10*3/uL (ref 3.8–10.8)

## 2021-10-22 NOTE — Telephone Encounter (Signed)
She may wait couple of days until she feels better to take the Enbrel shot.

## 2021-10-22 NOTE — Telephone Encounter (Signed)
Patient called stating she had her Covid booster yesterday and has a very sore and red arm, along with chills.  Patient states she is due to take her Enbrel tomorrow 10/23/21 and requested a return call to let her know if she should wait until she feels better.

## 2021-10-22 NOTE — Progress Notes (Signed)
CBC is normal.  Creatinine is mildly elevated and stable.  Please forward labs to her PCP.

## 2021-10-22 NOTE — Telephone Encounter (Signed)
Patient advised she may wait couple of days until she feels better to take the Enbrel shot.

## 2021-10-25 ENCOUNTER — Other Ambulatory Visit: Payer: Self-pay

## 2021-10-25 ENCOUNTER — Ambulatory Visit (INDEPENDENT_AMBULATORY_CARE_PROVIDER_SITE_OTHER): Payer: Medicare Other | Admitting: Rheumatology

## 2021-10-25 ENCOUNTER — Encounter: Payer: Self-pay | Admitting: Rheumatology

## 2021-10-25 VITALS — BP 158/96 | HR 68 | Ht 62.0 in | Wt 153.2 lb

## 2021-10-25 DIAGNOSIS — M8589 Other specified disorders of bone density and structure, multiple sites: Secondary | ICD-10-CM

## 2021-10-25 DIAGNOSIS — I10 Essential (primary) hypertension: Secondary | ICD-10-CM

## 2021-10-25 DIAGNOSIS — M5136 Other intervertebral disc degeneration, lumbar region: Secondary | ICD-10-CM

## 2021-10-25 DIAGNOSIS — Z853 Personal history of malignant neoplasm of breast: Secondary | ICD-10-CM

## 2021-10-25 DIAGNOSIS — M19071 Primary osteoarthritis, right ankle and foot: Secondary | ICD-10-CM

## 2021-10-25 DIAGNOSIS — M19042 Primary osteoarthritis, left hand: Secondary | ICD-10-CM

## 2021-10-25 DIAGNOSIS — R768 Other specified abnormal immunological findings in serum: Secondary | ICD-10-CM

## 2021-10-25 DIAGNOSIS — M19041 Primary osteoarthritis, right hand: Secondary | ICD-10-CM

## 2021-10-25 DIAGNOSIS — E559 Vitamin D deficiency, unspecified: Secondary | ICD-10-CM

## 2021-10-25 DIAGNOSIS — M19072 Primary osteoarthritis, left ankle and foot: Secondary | ICD-10-CM

## 2021-10-25 DIAGNOSIS — Z8719 Personal history of other diseases of the digestive system: Secondary | ICD-10-CM

## 2021-10-25 DIAGNOSIS — M0579 Rheumatoid arthritis with rheumatoid factor of multiple sites without organ or systems involvement: Secondary | ICD-10-CM | POA: Diagnosis not present

## 2021-10-25 DIAGNOSIS — Z79899 Other long term (current) drug therapy: Secondary | ICD-10-CM

## 2021-10-25 DIAGNOSIS — M4156 Other secondary scoliosis, lumbar region: Secondary | ICD-10-CM

## 2021-10-25 DIAGNOSIS — I341 Nonrheumatic mitral (valve) prolapse: Secondary | ICD-10-CM

## 2021-10-25 MED ORDER — ENBREL MINI 50 MG/ML ~~LOC~~ SOCT: SUBCUTANEOUS | 0 refills | Status: DC

## 2021-10-25 NOTE — Patient Instructions (Addendum)
Standing Labs We placed an order today for your standing lab work.   Please have your standing labs drawn in January and every 3 months   If possible, please have your labs drawn 2 weeks prior to your appointment so that the provider can discuss your results at your appointment.  Please note that you may see your imaging and lab results in [facility] before we have reviewed them. We may be awaiting multiple results to interpret others before contacting you. Please allow our office up to 72 hours to thoroughly review all of the results before contacting the office for clarification of your results.  We have open lab daily: Monday through Thursday from 1:30-4:30 PM and Friday from 1:30-4:00 PM at the office of Dr. Bo Merino, [facility].   Please be advised, all patients with office appointments requiring lab work will take precedent over walk-in lab work.  If possible, please come for your lab work on Monday and Friday afternoons, as you may experience shorter wait times. The office is located at [address] [place], [place], [place] [address] No appointment is necessary.   Labs are drawn by [facility]. Please bring your co-pay at the time of your lab draw.  You may receive a bill from [facility] for your lab work.  If you wish to have your labs drawn at another location, please call the office 24 hours in advance to send orders.  If you have any questions regarding directions or hours of operation,  please call [phone].   As a reminder, please drink plenty of water prior to coming for your lab work. Thanks!   Vaccines You are taking a medication(s) that can suppress your immune system.  The following immunizations are recommended: Flu annually Covid-19  Td/Tdap (tetanus, diphtheria, pertussis) every 10 years Pneumonia (Prevnar 15 then Pneumovax 23 at least 1 year apart.  Alternatively, can take Prevnar 20 without needing additional dose) Shingrix: 2 doses from 4  weeks to 6 months apart  Please check with your PCP to make sure you are up to date.   If you have signs or symptoms of an infection or start antibiotics: First, call your PCP for workup of your infection. Hold your medication through the infection, until you complete your antibiotics, and until symptoms resolve if you take the following: Injectable medication (Actemra, Benlysta, Cimzia, Cosentyx, Enbrel, Humira, Kevzara, Orencia, Remicade, Simponi, Stelara, Taltz, Tremfya) Methotrexate Leflunomide (Arava) Mycophenolate (Cellcept) Roma Kayser, or Rinvoq  Please get annual skin examination by dermatologist to screen for skin cancer while you are on Enbrel.

## 2021-10-26 MED ORDER — DIAZEPAM 5 MG PO TABS: ORAL_TABLET | ORAL | 0 refills | Status: DC

## 2021-10-26 NOTE — Addendum Note (Signed)
Addended by: Raymondo Band on: 10/26/2021 08:26 AM   Modules accepted: Orders

## 2021-10-29 ENCOUNTER — Ambulatory Visit: Payer: Medicare Other | Admitting: Specialist

## 2021-11-02 ENCOUNTER — Encounter: Payer: Self-pay | Admitting: Physical Medicine and Rehabilitation

## 2021-11-02 ENCOUNTER — Ambulatory Visit (INDEPENDENT_AMBULATORY_CARE_PROVIDER_SITE_OTHER): Payer: Medicare Other | Admitting: Physical Medicine and Rehabilitation

## 2021-11-02 ENCOUNTER — Ambulatory Visit: Payer: Self-pay

## 2021-11-02 ENCOUNTER — Other Ambulatory Visit: Payer: Self-pay

## 2021-11-02 VITALS — BP 118/77 | HR 102

## 2021-11-02 DIAGNOSIS — M5416 Radiculopathy, lumbar region: Secondary | ICD-10-CM

## 2021-11-02 MED ORDER — METHYLPREDNISOLONE ACETATE 80 MG/ML IJ SUSP
80.0000 mg | Freq: Once | INTRAMUSCULAR | Status: AC
  Administered 2021-11-02: 80 mg

## 2021-11-02 NOTE — Patient Instructions (Signed)

## 2021-11-02 NOTE — Progress Notes (Signed)
Pt state lower back pain that travels to her right hip. Pt state sitting and getting out of bed makes the pain worse. Pt state when standing at the stink or clean house she feels the pain.  Pt state she takes over the counter pain meds and pain cream to help ease her pain.  Numeric Pain Rating Scale and Functional Assessment Average Pain 6   In the last MONTH (on 0-10 scale) has pain interfered with the following?  1. General activity like being  able to carry out your everyday physical activities such as walking, climbing stairs, carrying groceries, or moving a chair?  Rating(9)   +Driver, -BT, -Dye Allergies.

## 2021-11-03 ENCOUNTER — Telehealth: Payer: Self-pay | Admitting: Pharmacist

## 2021-11-03 NOTE — Telephone Encounter (Signed)
Patient dropped off renewal application from [place] for ENBREL patient assistance. Signed patient portion, med list, insurance card copy, and PA approval letter collected.   Pending provider signature - placed in folder   Knox Saliva, PharmD, MPH, BCPS Clinical Pharmacist (Rheumatology and Pulmonology)

## 2021-11-04 NOTE — Telephone Encounter (Signed)
Submitted Patient Assistance RENEWAL Application to [employer] for ENBREL along with provider portion, PA patient portion, insurance card copy, med list . Will update patient when we receive a response.  Fax# [phone] Phone# [phone]  Knox Saliva, PharmD, MPH, BCPS Clinical Pharmacist (Rheumatology and Pulmonology)

## 2021-11-07 NOTE — Progress Notes (Addendum)
Victoria Sharp - 80 y.o. female MRN [id number]  Date of birth: Nov 16, 1941  Office Visit Note: Visit Date: 11/02/2021 PCP: [name]., MD Referred by: [name].,*  Subjective: Chief Complaint  Patient presents with   Lower Back - Pain   Right Hip - Pain   HPI:  Victoria Sharp is a 80 y.o. female who comes in today at the request of Dr. Basil Dess for planned Right L4-5 Lumbar Transforaminal epidural steroid injection with fluoroscopic guidance.  The patient has failed conservative care including home exercise, medications, time and activity modification.  This injection will be diagnostic and hopefully therapeutic.  Please see requesting physician notes for further details and justification.   **Please note that procedure note was mis written as left L4 but was in fact a right L4  ROS Otherwise per HPI.  Assessment & Plan: Visit Diagnoses:    ICD-10-CM   1. Lumbar radiculopathy  M54.16 XR C-ARM NO REPORT    Epidural Steroid injection    methylPREDNISolone acetate (DEPO-MEDROL) injection 80 mg      Plan: No additional findings.   Meds & Orders:  Meds ordered this encounter  Medications   methylPREDNISolone acetate (DEPO-MEDROL) injection 80 mg    Orders Placed This Encounter  Procedures   XR C-ARM NO REPORT   Epidural Steroid injection    Follow-up: Return for visit to requesting provider as needed.   Procedures: No procedures performed  Lumbosacral Transforaminal Epidural Steroid Injection - Sub-Pedicular Approach with Fluoroscopic Guidance  Patient: Victoria Sharp      Date of Birth: [birth year] MRN: [id number] PCP: [name]., MD      Visit Date: 11/02/2021   Universal Protocol:    Date/Time: 11/02/2021  Consent Given By: the patient  Position: PRONE  Additional Comments: Vital signs were monitored before and after the procedure. Patient was prepped and draped in the usual sterile fashion. The correct  patient, procedure, and site was verified.   Injection Procedure Details:   Procedure diagnoses: Lumbar radiculopathy [M54.16]    Meds Administered:  Meds ordered this encounter  Medications   methylPREDNISolone acetate (DEPO-MEDROL) injection 80 mg    Laterality: Left  Location/Site: L4  Needle:5.0 in., 22 ga.  Short bevel or Quincke spinal needle  Needle Placement: Transforaminal  Findings:    -Comments: Excellent flow of contrast along the nerve, nerve root and into the epidural space.  Procedure Details: After squaring off the end-plates to get a true AP view, the C-arm was positioned so that an oblique view of the foramen as noted above was visualized. The target area is just inferior to the "nose of the scotty dog" or sub pedicular. The soft tissues overlying this structure were infiltrated with 2-3 ml. of 1% Lidocaine without Epinephrine.  The spinal needle was inserted toward the target using a "trajectory" view along the fluoroscope beam.  Under AP and lateral visualization, the needle was advanced so it did not puncture dura and was located close the 6 O'Clock position of the pedical in AP tracterory. Biplanar projections were used to confirm position. Aspiration was confirmed to be negative for CSF and/or blood. A 1-2 ml. volume of Isovue-250 was injected and flow of contrast was noted at each level. Radiographs were obtained for documentation purposes.   After attaining the desired flow of contrast documented above, a 0.5 to 1.0 ml test dose of 0.25% Marcaine was injected into each respective transforaminal space.  The patient was observed for  90 seconds post injection.  After no sensory deficits were reported, and normal lower extremity motor function was noted,   the above injectate was administered so that equal amounts of the injectate were placed at each foramen (level) into the transforaminal epidural space.   Additional Comments:  The patient tolerated the  procedure well Dressing: 2 x 2 sterile gauze and Band-Aid    Post-procedure details: Patient was observed during the procedure. Post-procedure instructions were reviewed.  Patient left the clinic in stable condition.     Clinical History: 05/27/21 Lumbar x-ray  Left lumbar scoliosis 11-12 degrees, grade 1 spondylolisthesis, mild DDD  thoracolumbar spine and right SI joint sclerosis.   Basil Dess, MD     Objective:  VS:  HT:    WT:   BMI:     BP:118/77  HR:(!) 102bpm  TEMP: ( )  RESP:  Physical Exam Vitals and nursing note reviewed.  Constitutional:      General: She is not in acute distress.    Appearance: Normal appearance. She is not ill-appearing.  HENT:     Head: Normocephalic and atraumatic.     Right Ear: External ear normal.     Left Ear: External ear normal.  Eyes:     Extraocular Movements: Extraocular movements intact.  Cardiovascular:     Rate and Rhythm: Normal rate.     Pulses: Normal pulses.  Pulmonary:     Effort: Pulmonary effort is normal. No respiratory distress.  Abdominal:     General: There is no distension.     Palpations: Abdomen is soft.  Musculoskeletal:        General: Tenderness present.     Cervical back: Neck supple.     Right lower leg: No edema.     Left lower leg: No edema.     Comments: Patient has good distal strength with no pain over the greater trochanters.  No clonus or focal weakness.  Skin:    Findings: No erythema, lesion or rash.  Neurological:     General: No focal deficit present.     Mental Status: She is alert and oriented to person, place, and time.     Sensory: No sensory deficit.     Motor: No weakness or abnormal muscle tone.     Coordination: Coordination normal.  Psychiatric:        Mood and Affect: Mood normal.        Behavior: Behavior normal.     Imaging: No results found.

## 2021-11-07 NOTE — Procedures (Signed)
Lumbosacral Transforaminal Epidural Steroid Injection - Sub-Pedicular Approach with Fluoroscopic Guidance  Patient: Victoria Sharp      Date of Birth: 11-30-1941 MRN: [id number] PCP: [name]., MD      Visit Date: 11/02/2021   Universal Protocol:    Date/Time: 11/02/2021  Consent Given By: the patient  Position: PRONE  Additional Comments: Vital signs were monitored before and after the procedure. Patient was prepped and draped in the usual sterile fashion. The correct patient, procedure, and site was verified.   Injection Procedure Details:   Procedure diagnoses: Lumbar radiculopathy [M54.16]    Meds Administered:  Meds ordered this encounter  Medications   methylPREDNISolone acetate (DEPO-MEDROL) injection 80 mg    Laterality: Left  Location/Site: L4  Needle:5.0 in., 22 ga.  Short bevel or Quincke spinal needle  Needle Placement: Transforaminal  Findings:    -Comments: Excellent flow of contrast along the nerve, nerve root and into the epidural space.  Procedure Details: After squaring off the end-plates to get a true AP view, the C-arm was positioned so that an oblique view of the foramen as noted above was visualized. The target area is just inferior to the "nose of the scotty dog" or sub pedicular. The soft tissues overlying this structure were infiltrated with 2-3 ml. of 1% Lidocaine without Epinephrine.  The spinal needle was inserted toward the target using a "trajectory" view along the fluoroscope beam.  Under AP and lateral visualization, the needle was advanced so it did not puncture dura and was located close the 6 O'Clock position of the pedical in AP tracterory. Biplanar projections were used to confirm position. Aspiration was confirmed to be negative for CSF and/or blood. A 1-2 ml. volume of Isovue-250 was injected and flow of contrast was noted at each level. Radiographs were obtained for documentation purposes.   After attaining the  desired flow of contrast documented above, a 0.5 to 1.0 ml test dose of 0.25% Marcaine was injected into each respective transforaminal space.  The patient was observed for 90 seconds post injection.  After no sensory deficits were reported, and normal lower extremity motor function was noted,   the above injectate was administered so that equal amounts of the injectate were placed at each foramen (level) into the transforaminal epidural space.   Additional Comments:  The patient tolerated the procedure well Dressing: 2 x 2 sterile gauze and Band-Aid    Post-procedure details: Patient was observed during the procedure. Post-procedure instructions were reviewed.  Patient left the clinic in stable condition.

## 2021-11-11 ENCOUNTER — Ambulatory Visit: Payer: Medicare Other | Admitting: Specialist

## 2021-11-12 NOTE — Telephone Encounter (Signed)
[employer] for update on patient's Enbrel PAP renewal application.    Received a verbal confirmation from  [employer] rep regarding an approval for ENBREL patient assistance from 11/12/21 to 12/25/22.   Phone number: [phone]  Confirmed clinic fax number for approval letter to be faxed to.  Knox Saliva, PharmD, MPH, BCPS Clinical Pharmacist (Rheumatology and Pulmonology)

## 2021-11-25 ENCOUNTER — Telehealth: Payer: Self-pay | Admitting: Specialist

## 2021-11-25 ENCOUNTER — Other Ambulatory Visit: Payer: Self-pay | Admitting: Specialist

## 2021-11-25 DIAGNOSIS — M4316 Spondylolisthesis, lumbar region: Secondary | ICD-10-CM

## 2021-11-25 DIAGNOSIS — M48062 Spinal stenosis, lumbar region with neurogenic claudication: Secondary | ICD-10-CM

## 2021-11-25 NOTE — Telephone Encounter (Signed)
Pt stating she is having severe pain every other day. Pt stated after her inj with Dr. Ernestina Patches on 11/02/21 she did feel better but now the pain is back to the previous level it was and she is using a heating pad to help. Pt stated she has an autoimmune disorder and stated she can not take many medications but pt does have a follow up appt sch'd with Dr. Louanne Skye for 12/02/21. The best call back number for the pt is [phone].

## 2021-12-01 ENCOUNTER — Telehealth: Payer: Self-pay | Admitting: Specialist

## 2021-12-01 NOTE — Telephone Encounter (Signed)
done

## 2021-12-01 NOTE — Telephone Encounter (Signed)
Pt called requesting a call back from [facility]. Pt is confused on if she need to keep her appt with Dr. Louanne Skye. Pt states last appt she had with Dr. Louanne Skye stated to put referral in for  back injection and pt hasn't heard anything from Dr. Romona Curls office to set appt and unsure if Dr. Louanne Skye want her to reschedule appt with him after injection with Dr. Ernestina Patches. Please call pt about this matter at [phone].

## 2021-12-02 ENCOUNTER — Ambulatory Visit: Payer: Medicare Other | Admitting: Specialist

## 2021-12-14 ENCOUNTER — Encounter: Payer: Self-pay | Admitting: Physical Medicine and Rehabilitation

## 2021-12-14 ENCOUNTER — Ambulatory Visit: Payer: Self-pay

## 2021-12-14 ENCOUNTER — Other Ambulatory Visit: Payer: Self-pay

## 2021-12-14 ENCOUNTER — Ambulatory Visit (INDEPENDENT_AMBULATORY_CARE_PROVIDER_SITE_OTHER): Payer: Medicare Other | Admitting: Physical Medicine and Rehabilitation

## 2021-12-14 VITALS — BP 142/90 | HR 77

## 2021-12-14 DIAGNOSIS — M5416 Radiculopathy, lumbar region: Secondary | ICD-10-CM | POA: Diagnosis not present

## 2021-12-14 MED ORDER — METHYLPREDNISOLONE ACETATE 80 MG/ML IJ SUSP
80.0000 mg | Freq: Once | INTRAMUSCULAR | Status: AC
  Administered 2021-12-14: 10:00:00 80 mg

## 2021-12-14 NOTE — Procedures (Signed)
Lumbosacral Transforaminal Epidural Steroid Injection - Sub-Pedicular Approach with Fluoroscopic Guidance  Patient: Victoria Sharp      Date of Birth: 09/13/1941 MRN: [id number] PCP: [name]., MD      Visit Date: 12/14/2021   Universal Protocol:    Date/Time: 12/14/2021  Consent Given By: the patient  Position: PRONE  Additional Comments: Vital signs were monitored before and after the procedure. Patient was prepped and draped in the usual sterile fashion. The correct patient, procedure, and site was verified.   Injection Procedure Details:   Procedure diagnoses: Lumbar radiculopathy [M54.16]    Meds Administered:  Meds ordered this encounter  Medications   methylPREDNISolone acetate (DEPO-MEDROL) injection 80 mg    Laterality: Right  Location/Site: L4  Needle:5.0 in., 22 ga.  Short bevel or Quincke spinal needle  Needle Placement: Transforaminal  Findings:    -Comments: Excellent flow of contrast along the nerve, nerve root and into the epidural space.  Procedure Details: After squaring off the end-plates to get a true AP view, the C-arm was positioned so that an oblique view of the foramen as noted above was visualized. The target area is just inferior to the "nose of the scotty dog" or sub pedicular. The soft tissues overlying this structure were infiltrated with 2-3 ml. of 1% Lidocaine without Epinephrine.  The spinal needle was inserted toward the target using a "trajectory" view along the fluoroscope beam.  Under AP and lateral visualization, the needle was advanced so it did not puncture dura and was located close the 6 O'Clock position of the pedical in AP tracterory. Biplanar projections were used to confirm position. Aspiration was confirmed to be negative for CSF and/or blood. A 1-2 ml. volume of Isovue-250 was injected and flow of contrast was noted at each level. Radiographs were obtained for documentation purposes.   After attaining the  desired flow of contrast documented above, a 0.5 to 1.0 ml test dose of 0.25% Marcaine was injected into each respective transforaminal space.  The patient was observed for 90 seconds post injection.  After no sensory deficits were reported, and normal lower extremity motor function was noted,   the above injectate was administered so that equal amounts of the injectate were placed at each foramen (level) into the transforaminal epidural space.   Additional Comments:  The patient tolerated the procedure well Dressing: 2 x 2 sterile gauze and Band-Aid    Post-procedure details: Patient was observed during the procedure. Post-procedure instructions were reviewed.  Patient left the clinic in stable condition.

## 2021-12-14 NOTE — Progress Notes (Signed)
Right L4, Pt state lower back pain that travels to her right hip. Pt state sitting and getting out of bed makes the pain worse. Pt state when standing at the stink or clean house she feels the pain.  Pt state she takes over the counter pain meds and pain cream to help ease her pain. Pt has hx of inj on 11/02/21 pt state it helped for eight days.  Numeric Pain Rating Scale and Functional Assessment Average Pain 6   In the last MONTH (on 0-10 scale) has pain interfered with the following?  1. General activity like being  able to carry out your everyday physical activities such as walking, climbing stairs, carrying groceries, or moving a chair?  Rating(9)   +Driver, -BT, -Dye Allergies.

## 2021-12-14 NOTE — Progress Notes (Signed)
MIKENZIE MCCANNON - 80 y.o. female MRN [id number]  Date of birth: [birth year]  Office Visit Note: Visit Date: 12/14/2021 PCP: [name]., MD Referred by: [name].,*  Subjective: Chief Complaint  Patient presents with   Lower Back - Pain   Right Hip - Pain   HPI:  ASHLEAH VALTIERRA is a 80 y.o. female who comes in today for planned repeat Right L4-5  Lumbar Transforaminal epidural steroid injection with fluoroscopic guidance.  The patient has failed conservative care including home exercise, medications, time and activity modification.  This injection will be diagnostic and hopefully therapeutic.  Please see requesting physician notes for further details and justification. Patient received more than 50% pain relief from prior injection. MRI reviewed with images and spine model.  MRI reviewed in the note below.  Please note that prior injection was mislabeled as an left-sided injection when in point of fact it was right-sided I did do an addendum to the last note to indicate that.  We will repeat the right side today at L4 due to her radicular leg pain and moderate stenosis.   Referring: Dr. Basil Dess   ROS Otherwise per HPI.  Assessment & Plan: Visit Diagnoses:    ICD-10-CM   1. Lumbar radiculopathy  M54.16 XR C-ARM NO REPORT    Epidural Steroid injection    methylPREDNISolone acetate (DEPO-MEDROL) injection 80 mg      Plan: No additional findings.   Meds & Orders:  Meds ordered this encounter  Medications   methylPREDNISolone acetate (DEPO-MEDROL) injection 80 mg    Orders Placed This Encounter  Procedures   XR C-ARM NO REPORT   Epidural Steroid injection    Follow-up: Return for visit to requesting provider as needed.   Procedures: No procedures performed  Lumbosacral Transforaminal Epidural Steroid Injection - Sub-Pedicular Approach with Fluoroscopic Guidance  Patient: MICKAELA STARLIN      Date of Birth: [birth year] MRN:  [id number] PCP: [name]., MD      Visit Date: 12/14/2021   Universal Protocol:    Date/Time: 12/14/2021  Consent Given By: the patient  Position: PRONE  Additional Comments: Vital signs were monitored before and after the procedure. Patient was prepped and draped in the usual sterile fashion. The correct patient, procedure, and site was verified.   Injection Procedure Details:   Procedure diagnoses: Lumbar radiculopathy [M54.16]    Meds Administered:  Meds ordered this encounter  Medications   methylPREDNISolone acetate (DEPO-MEDROL) injection 80 mg    Laterality: Right  Location/Site: L4  Needle:5.0 in., 22 ga.  Short bevel or Quincke spinal needle  Needle Placement: Transforaminal  Findings:    -Comments: Excellent flow of contrast along the nerve, nerve root and into the epidural space.  Procedure Details: After squaring off the end-plates to get a true AP view, the C-arm was positioned so that an oblique view of the foramen as noted above was visualized. The target area is just inferior to the "nose of the scotty dog" or sub pedicular. The soft tissues overlying this structure were infiltrated with 2-3 ml. of 1% Lidocaine without Epinephrine.  The spinal needle was inserted toward the target using a "trajectory" view along the fluoroscope beam.  Under AP and lateral visualization, the needle was advanced so it did not puncture dura and was located close the 6 O'Clock position of the pedical in AP tracterory. Biplanar projections were used to confirm position. Aspiration was confirmed to be negative for CSF and/or blood.  A 1-2 ml. volume of Isovue-250 was injected and flow of contrast was noted at each level. Radiographs were obtained for documentation purposes.   After attaining the desired flow of contrast documented above, a 0.5 to 1.0 ml test dose of 0.25% Marcaine was injected into each respective transforaminal space.  The patient was observed for 90  seconds post injection.  After no sensory deficits were reported, and normal lower extremity motor function was noted,   the above injectate was administered so that equal amounts of the injectate were placed at each foramen (level) into the transforaminal epidural space.   Additional Comments:  The patient tolerated the procedure well Dressing: 2 x 2 sterile gauze and Band-Aid    Post-procedure details: Patient was observed during the procedure. Post-procedure instructions were reviewed.  Patient left the clinic in stable condition.    Clinical History: 05/27/21 Lumbar x-ray  Left lumbar scoliosis 11-12 degrees, grade 1 spondylolisthesis, mild DDD  thoracolumbar spine and right SI joint sclerosis.   James NitMRI LUMBAR SPINE WITHOUT CONTRAST   TECHNIQUE: Multiplanar, multisequence MR imaging of the lumbar spine was performed. No intravenous contrast was administered.   COMPARISON:  None.   FINDINGS: Segmentation: Standard segmentation is assumed. The the inferior-most fully formed intervertebral disc labeled L5-S1.   Alignment:  Grade 1 anterolisthesis of L4 on L5.   Vertebrae: Vertebral body heights are maintained. No focal marrow edema to suggest acute fracture discitis/osteomyelitis.   Conus medullaris and cauda equina: Conus extends to the L1-L2 level. Conus appears normal.   Paraspinal and other soft tissues: Left parapelvic cysts.   Disc levels:   Motion limited evaluation.   T12-L1: No significant disc protrusion, foraminal stenosis, or canal stenosis.   L1-L2: Mild disc bulging and mild bilateral facet arthropathy without significant stenosis.   L2-L3: Mild disc bulging and mild bilateral facet arthropathy with ligamentum flavum thickening. No significant stenosis.   L3-L4: Mild disc bulging, mild bilateral facet arthropathy, and ligamentum flavum thickening. Mild canal and right foraminal stenosis. No significant left foraminal stenosis.   L4-L5:  Grade 1 anterolisthesis. Uncovering of the disc with superimposed mild broad disc bulge. Moderate bilateral facet arthropathy with small bilateral facet joint effusions. Ligamentum flavum thickening. Resulting moderate canal stenosis with bilateral subarticular recess narrowing. Moderate right foraminal stenosis without significant left foraminal stenosis.   L5-S1: Mild disc bulging and mild bilateral facet arthropathy without significant canal or foraminal stenosis.   IMPRESSION: 1. At L4-L5, moderate canal stenosis with bilateral subarticular recess narrowing and moderate right foraminal stenosis. Grade 1 anterolisthesis at this level. 2. At L3-L4, mild canal and right foraminal stenosis.     Electronically Signed   By: Margaretha Sheffield M.D.   On: 10/10/2021 19:52ka, MD     Objective:  VS:  HT:     WT:    BMI:      BP:(!) 142/90   HR:77bpm   TEMP: ( )   RESP:  Physical Exam Vitals and nursing note reviewed.  Constitutional:      General: She is not in acute distress.    Appearance: Normal appearance. She is not ill-appearing.  HENT:     Head: Normocephalic and atraumatic.     Right Ear: External ear normal.     Left Ear: External ear normal.  Eyes:     Extraocular Movements: Extraocular movements intact.  Cardiovascular:     Rate and Rhythm: Normal rate.     Pulses: Normal pulses.  Pulmonary:  Effort: Pulmonary effort is normal. No respiratory distress.  Abdominal:     General: There is no distension.     Palpations: Abdomen is soft.  Musculoskeletal:        General: Tenderness present.     Cervical back: Neck supple.     Right lower leg: No edema.     Left lower leg: No edema.     Comments: Patient has good distal strength with no pain over the greater trochanters.  No clonus or focal weakness.  Skin:    Findings: No erythema, lesion or rash.  Neurological:     General: No focal deficit present.     Mental Status: She is alert and oriented to person, place,  and time.     Sensory: No sensory deficit.     Motor: No weakness or abnormal muscle tone.     Coordination: Coordination normal.  Psychiatric:        Mood and Affect: Mood normal.        Behavior: Behavior normal.     Imaging: No results found.

## 2021-12-14 NOTE — Patient Instructions (Signed)

## 2021-12-30 ENCOUNTER — Encounter: Payer: Self-pay | Admitting: Specialist

## 2021-12-30 ENCOUNTER — Other Ambulatory Visit: Payer: Self-pay

## 2021-12-30 ENCOUNTER — Ambulatory Visit (INDEPENDENT_AMBULATORY_CARE_PROVIDER_SITE_OTHER): Payer: Medicare Other | Admitting: Specialist

## 2021-12-30 VITALS — BP 179/102 | HR 69 | Ht 62.0 in | Wt 153.2 lb

## 2021-12-30 DIAGNOSIS — M4316 Spondylolisthesis, lumbar region: Secondary | ICD-10-CM | POA: Diagnosis not present

## 2021-12-30 DIAGNOSIS — M48062 Spinal stenosis, lumbar region with neurogenic claudication: Secondary | ICD-10-CM

## 2021-12-30 DIAGNOSIS — M5416 Radiculopathy, lumbar region: Secondary | ICD-10-CM

## 2021-12-30 MED ORDER — GABAPENTIN 100 MG PO CAPS
100.0000 mg | ORAL_CAPSULE | Freq: Every day | ORAL | 1 refills | Status: DC
Start: 2021-12-30 — End: 2022-04-14

## 2021-12-30 NOTE — Patient Instructions (Signed)
Avoid bending, stooping and avoid lifting weights greater than 10 lbs. Avoid prolong standing and walking. Order for a new walker with wheels. Surgery scheduling secretary Kandice Hams, will call you in the next week to schedule for surgery.  Surgery recommended is a one level lumbar fusion L4-5 this would be done with rods, screws and cages with local bone graft and allograft (donor bone graft). Take tylenol or tylenol#3 for for pain. Risk of surgery includes risk of infection 1 in 300 patients, bleeding 1/2% chance you would need a transfusion.   Risk to the nerves is one in 10,000. You will need to use a brace for 3 months and wean from the brace on the 4th month. Expect improved walking and standing tolerance. Expect relief of leg pain but numbness may persist depending on the length and degree of pressure that has been present.

## 2021-12-30 NOTE — Progress Notes (Addendum)
Office Visit Note   Patient: Victoria Sharp           Date of Birth: 10/21/1941           MRN: [id number] Visit Date: 12/30/2021              Requested by: [name]., MD [address] [place],  [place] [address] PCP: [name]., MD   Assessment & Plan: Visit Diagnoses:  1. Spondylolisthesis, lumbar region   2. Spinal stenosis of lumbar region with neurogenic claudication   3. Radiculopathy, lumbar region     Plan: Avoid bending, stooping and avoid lifting weights greater than 10 lbs. Avoid prolong standing and walking. Order for a new walker with wheels. Surgery scheduling secretary Kandice Hams, will call you in the next week to schedule for surgery.  Surgery recommended is a one level lumbar fusion L4-5 this would be done with rods, screws and cages with local bone graft and allograft (donor bone graft). Take tylenol or tylenol#3 for for pain. Risk of surgery includes risk of infection 1 in 300 patients, bleeding 1/2% chance you would need a transfusion.   Risk to the nerves is one in 10,000. You will need to use a brace for 3 months and wean from the brace on the 4th month. Expect improved walking and standing tolerance. Expect relief of leg pain but numbness may persist depending on the length and degree of pressure that has been present.  Follow-Up Instructions: No follow-ups on file.   Orders:  No orders of the defined types were placed in this encounter.  No orders of the defined types were placed in this encounter.     Procedures: No procedures performed   Clinical Data: No additional findings.   Subjective: Chief Complaint  Patient presents with   Lower Back - Follow-up    Had a right L4 Tfinjection 1220/22 and got 80% relief for 5 days and then pain was severe for several days and now it is bearable.    81 year old female with RA and spondylolisthesis L4-5, underwent right L4-5 transforamenal ESI with initial increase leg  pain with injection then temporary relief for some hours then recurrent pain followed by improvement in the pain after another 1-2 weeks. She reports persistent right leg pain with pain into the right anterior thigh and right anterior calf. No bowel or bladder difficulty. Still with limited standing and walkng tolerance. Unable to shop and has to lean on furniture and previously cart when grocery shopping.  Walking limited to less than 200'. She is considering surgical solution.    Review of Systems  Constitutional: Negative.   HENT:  Positive for rhinorrhea (slight cold around Christmas.).   Eyes: Negative.   Respiratory: Negative.    Cardiovascular: Negative.   Gastrointestinal: Negative.   Endocrine: Negative.   Genitourinary: Negative.   Musculoskeletal: Negative.   Skin: Negative.   Allergic/Immunologic: Negative.   Neurological: Negative.   Hematological: Negative.   Psychiatric/Behavioral: Negative.      Objective: Vital Signs: BP (!) 179/102 (BP Location: Left Arm, Patient Position: Sitting)    Pulse 69    Ht 5\' 2"  (1.575 m)    Wt 153 lb 3.2 oz (69.5 kg)    BMI 28.02 kg/m   Physical Exam  Back Exam   Tenderness  The patient is experiencing tenderness in the lumbar.  Range of Motion  Extension:  abnormal  Flexion:  abnormal      Specialty  Comments:  No specialty comments available.  Imaging: No results found.   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 organ or systems involvement ([place]) 06/26/2020   Essential hypertension 06/26/2020   MVP (mitral valve prolapse) 06/26/2020   History of Barrett's esophagus 06/26/2020   History of gastroesophageal reflux (GERD) 06/26/2020   History of breast cancer 06/26/2020   High risk medication use 06/26/2020   Past Medical History:  Diagnosis Date   Dermatitis     History of breast cancer    Hypertension    Rheumatoid arthritis ([place])    Tachycardia     Family History  Problem Relation Age of Onset   Heart attack Father    Kidney disease Brother    Cancer Brother     Past Surgical History:  Procedure Laterality Date   ABDOMINAL HYSTERECTOMY     ABLATION  08/2021   Dr. Ernestina Patches   BREAST LUMPECTOMY Left 1998   NEUROMA SURGERY Right 03/2020   WEDGE RESECTION     Social History   Occupational History   Not on file  Tobacco Use   Smoking status: Never   Smokeless tobacco: Never  Vaping Use   Vaping Use: Never used  Substance and Sexual Activity   Alcohol use: Yes    Comment: rarely   Drug use: Never   Sexual activity: Not on file

## 2022-01-11 ENCOUNTER — Telehealth: Payer: Self-pay | Admitting: Rheumatology

## 2022-01-11 DIAGNOSIS — M545 Low back pain, unspecified: Secondary | ICD-10-CM

## 2022-01-11 NOTE — Telephone Encounter (Signed)
I returned patient's call.  She may have wanted me to review Dr. Otho Ket note which I did and explained the reason to the patient why he suggested surgery.  She voiced understanding.  She plans to schedule surgery for lumbar spine fusion with Dr. Louanne Skye.

## 2022-01-11 NOTE — Telephone Encounter (Signed)
Patient called the office stating Dr. Estanislado Pandy referred her to Dr. Louanne Skye (Orthopedic surgery). Patient states she has seen him and he suggests a lumbar fusion of L4-5. Patient states she would like a return call from Dr. Estanislado Pandy or Lovena Le.

## 2022-01-12 NOTE — Addendum Note (Signed)
Addended by: Earnestine Mealing on: 01/12/2022 02:37 PM   Modules accepted: Orders

## 2022-01-12 NOTE — Telephone Encounter (Signed)
Please refer to neurosurgery-Dr. Pieter Partridge Dawley.

## 2022-01-12 NOTE — Telephone Encounter (Signed)
Referral has been placed and patient has been advised. Patient verbalized understanding.

## 2022-01-12 NOTE — Telephone Encounter (Signed)
Patient called back and would like a second opinion from a neurosurgeon. Patient is requesting that we place a referral. Okay to place referral and who would you recommend?   Thanks!

## 2022-01-17 ENCOUNTER — Telehealth: Payer: Self-pay

## 2022-01-17 ENCOUNTER — Encounter: Payer: Self-pay | Admitting: *Deleted

## 2022-01-17 ENCOUNTER — Telehealth: Payer: Self-pay | Admitting: Adult Health

## 2022-01-17 NOTE — Telephone Encounter (Signed)
I agree with looking at [facility]. All providers there are excellent and I do not recommend one specific provider she should be seen by. She can get this second opinion referral from either Dr. Estanislado Pandy or PCP.   I do not believe prophylactic therapy is warranted for her migraines at this time as they occur infrequently. Okay for occasional use of Tylenol.

## 2022-01-17 NOTE — Telephone Encounter (Signed)
FYI:  [facility] called stating patient is scheduled for 01/26/22 at 1:00 pm with Dr. Emelda Brothers.

## 2022-01-17 NOTE — Telephone Encounter (Signed)
Pt called states she is having migraines now and does not know if they a frequent enough to get on medication. Pt requesting a call back.

## 2022-01-17 NOTE — Telephone Encounter (Signed)
Called patient who stated she's having little tiny migraines every two weeks, nothing like se used to have. She manages them well with tylenol. Advised she let us know if they get more frequent, more severe, not controlled with tylenol. She agreed then stated Dr Estanislado Pandy referred her to orthopedic surgeon for Lumbar spine surgery. She and her family want a second opinion with a neurosurgeon. She would like Janett Billow or Dr Clydene Fake specific recommendation of a neurosurgeon. I advised she look up [facility] and read bios, reviews. She stated she will but still wants opinion of her neurologist. I advised will ask NP. Patient stated she can be given reply through her my chart Patient verbalized understanding, appreciation.

## 2022-01-18 ENCOUNTER — Telehealth: Payer: Self-pay | Admitting: Specialist

## 2022-01-18 NOTE — Telephone Encounter (Signed)
Patients family is really pushing her to get a second opinion on her lumbar surgery. Patient still wants to be seen by Louanne Skye & is keeping her appointment on 2/15 where she will explain more of the situation. She states if Louanne Skye would like to speak to her sooner it is okay for him to call!

## 2022-01-18 NOTE — Telephone Encounter (Signed)
Noted in referral.

## 2022-01-19 ENCOUNTER — Telehealth: Payer: Self-pay | Admitting: Specialist

## 2022-01-19 NOTE — Telephone Encounter (Signed)
Pt called requesting a call back from [place]. Pt states she would like to discuss a referral being sent to a Neuro dr that Dr. Louanne Skye recommends. Please call pt at [phone].

## 2022-01-21 NOTE — Telephone Encounter (Signed)
I called and advised patient that I had left this message on Dr.Nitka's desk and that he had not responded to it yet. She states that she call [facility], She states that they have scheduled her with Dr. Zada Finders there for her second opinion. I advised that if Dr. Louanne Skye states something different I would let her know, she states that she is scheduled for 01/26/22

## 2022-01-28 ENCOUNTER — Telehealth: Payer: Self-pay | Admitting: Specialist

## 2022-01-28 NOTE — Telephone Encounter (Signed)
Patient called. She would like Christy to call her. [phone]

## 2022-01-31 ENCOUNTER — Telehealth: Payer: Self-pay | Admitting: Specialist

## 2022-01-31 NOTE — Telephone Encounter (Signed)
Patient did go to see Dr. Zada Finders on 01/26/22 and he agreed with Dr. Otho Ket plan, however due to Dr. Louanne Skye retiring, she would like someone that is going to be around for years to come to follow her. I advised that this was completely understandable and that if she needed [place] for anything to call us. We did decide to cancel her appt with Dr. Louanne Skye on 02/09/22

## 2022-02-03 ENCOUNTER — Other Ambulatory Visit: Payer: Self-pay | Admitting: Neurological Surgery

## 2022-02-03 ENCOUNTER — Telehealth: Payer: Self-pay | Admitting: Rheumatology

## 2022-02-03 NOTE — Telephone Encounter (Signed)
Patient called the office stating she was having back surgery on March 13th. Patient states she would like to know when she can stop her Embrel and when she can resume after surgery.

## 2022-02-03 NOTE — Telephone Encounter (Signed)
She should stop Enbrel one week prior to the surgery and may resume 2 weeks after the surgery if there is no infection and if she gets clearance from the surgeon.

## 2022-02-04 NOTE — Telephone Encounter (Signed)
Patient advised she should stop Enbrel one week prior to the surgery and may resume 2 weeks after the surgery if there is no infection and if she gets clearance from the surgeon. Patient expressed understanding.

## 2022-02-09 ENCOUNTER — Ambulatory Visit: Payer: Medicare Other | Admitting: Specialist

## 2022-02-24 NOTE — Progress Notes (Signed)
Surgical Instructions ? ? ? Your procedure is scheduled on Monday, March 13th, 2023. ? ? Report to [facility] Main Entrance "A" at 05:30 A.M., then check in with the Admitting office. ? Call this number if you have problems the morning of surgery: ? [phone] ? ? If you have any questions prior to your surgery date call [phone]: Open Monday-Friday 8am-4pm ? ? ? Remember: ? Do not eat after midnight the night before your surgery ? ?You may drink clear liquids until 04:30 the morning of your surgery.   ?Clear liquids allowed are: Water, Non-Citrus Juices (without pulp), Carbonated Beverages, Clear Tea, Black Coffee ONLY (NO MILK, CREAM OR POWDERED CREAMER of any kind), and Gatorade ?  ? Take these medicines the morning of surgery with A SIP OF WATER:  ? ?amLODipine (NORVASC)  ?pantoprazole (PROTONIX)  ?Polyethyl Glycol-Propyl Glycol (SYSTANE) ?acetaminophen (TYLENOL)  - as needed ? ?Follow your surgeon's instructions on when to stop Aspirin.  If no instructions were given by your surgeon then you will need to call the office to get those instructions.    ? ?As of today, STOP taking any Aspirin (unless otherwise instructed by your surgeon) Aleve, Naproxen, Ibuprofen, Motrin, Advil, Goody's, BC's, all herbal medications, fish oil, and all vitamins. ? ? ?The day of surgery: ?         ?Do not wear jewelry or makeup ?Do not wear lotions, powders, perfumes, or deodorant. ?Do not shave 48 hours prior to surgery.   ?Do not bring valuables to the hospital. ?Do not wear nail polish, gel polish, artificial nails, or any other type of covering on natural nails (fingers and toes) ?If you have artificial nails or gel coating that need to be removed by a nail salon, please have this removed prior to surgery. Artificial nails or gel coating may interfere with anesthesia's ability to adequately monitor your vital signs. ? ? ?[facility] is not responsible for any belongings or valuables. .  ? ?Do NOT Smoke (Tobacco/Vaping)  24  hours prior to your procedure ? ?If you use a CPAP at night, you may bring your mask for your overnight stay. ?  ?Contacts, glasses, hearing aids, dentures or partials may not be worn into surgery, please bring cases for these belongings ?  ?For patients admitted to the hospital, discharge time will be determined by your treatment team. ?  ?Patients discharged the day of surgery will not be allowed to drive home, and someone needs to stay with them for 24 hours. ? ?NO VISITORS WILL BE ALLOWED IN PRE-OP WHERE PATIENTS ARE PREPPED FOR SURGERY.  ONLY 1 SUPPORT PERSON MAY BE PRESENT IN THE WAITING ROOM WHILE YOU ARE IN SURGERY.  IF YOU ARE TO BE ADMITTED, ONCE YOU ARE IN YOUR ROOM YOU WILL BE ALLOWED TWO (2) VISITORS. 1 (ONE) VISITOR MAY STAY OVERNIGHT BUT MUST ARRIVE TO THE ROOM BY 8pm.  Minor children may have two parents present. Special consideration for safety and communication needs will be reviewed on a case by case basis. ? ?Special instructions:   ? ?Oral Hygiene is also important to reduce your risk of infection.  Remember - BRUSH YOUR TEETH THE MORNING OF SURGERY WITH YOUR REGULAR TOOTHPASTE ? ? ?[facility]- Preparing For Surgery ? ?Before surgery, you can play an important role. Because skin is not sterile, your skin needs to be as free of germs as possible. You can reduce the number of germs on your skin by washing with CHG (chlorahexidine gluconate) Soap  before surgery.  CHG is an antiseptic cleaner which kills germs and bonds with the skin to continue killing germs even after washing.   ? ? ?Please do not use if you have an allergy to CHG or antibacterial soaps. If your skin becomes reddened/irritated stop using the CHG.  ?Do not shave (including legs and underarms) for at least 48 hours prior to first CHG shower. It is OK to shave your face. ? ?Please follow these instructions carefully. ?  ? ? Shower the NIGHT BEFORE SURGERY and the MORNING OF SURGERY with CHG Soap.  ? If you chose to wash your hair,  wash your hair first as usual with your normal shampoo. After you shampoo, rinse your hair and body thoroughly to remove the shampoo.  Then [employer] and genitals (private parts) with your normal soap and rinse thoroughly to remove soap. ? ?After that Use CHG Soap as you would any other liquid soap. You can apply CHG directly to the skin and wash gently with a scrungie or a clean washcloth.  ? ?Apply the CHG Soap to your body ONLY FROM THE NECK DOWN.  Do not use on open wounds or open sores. Avoid contact with your eyes, ears, mouth and genitals (private parts). Wash Face and genitals (private parts)  with your normal soap.  ? ?Wash thoroughly, paying special attention to the area where your surgery will be performed. ? ?Thoroughly rinse your body with warm water from the neck down. ? ?DO NOT shower/wash with your normal soap after using and rinsing off the CHG Soap. ? ?Pat yourself dry with a CLEAN TOWEL. ? ?Wear CLEAN PAJAMAS to bed the night before surgery ? ?Place CLEAN SHEETS on your bed the night before your surgery ? ?DO NOT SLEEP WITH PETS. ? ? ?Day of Surgery: ? ?Take a shower with CHG soap. ?Wear Clean/Comfortable clothing the morning of surgery ?Do not apply any deodorants/lotions.   ?Remember to brush your teeth WITH YOUR REGULAR TOOTHPASTE. ? ? ? ?COVID testing ? ?If you are going to stay overnight or be admitted after your procedure/surgery and require a pre-op COVID test, please follow these instructions after your COVID test  ? ?You are not required to quarantine however you are required to wear a well-fitting mask when you are out and around people not in your household.  If your mask becomes wet or soiled, replace with a new one. ? ?Wash your hands often with soap and water for 20 seconds or clean your hands with an alcohol-based hand sanitizer that contains at least 60% alcohol. ? ?Do not share personal items. ? ?Notify your provider: ?if you are in close contact with someone who has COVID  ?or if  you develop a fever of 100.4 or greater, sneezing, cough, sore throat, shortness of breath or body aches. ? ?  ?Please read over the following fact sheets that you were given.   ?

## 2022-02-25 ENCOUNTER — Other Ambulatory Visit: Payer: Self-pay

## 2022-02-25 ENCOUNTER — Encounter (HOSPITAL_COMMUNITY)
Admission: RE | Admit: 2022-02-25 | Discharge: 2022-02-25 | Disposition: A | Discharge status: Home / Self Care | Payer: Medicare Other | Source: Ambulatory Visit | Attending: Neurological Surgery | Admitting: Neurological Surgery

## 2022-02-25 ENCOUNTER — Encounter (HOSPITAL_COMMUNITY): Payer: Self-pay

## 2022-02-25 VITALS — BP 142/87 | HR 78 | Temp 98.6°F | Resp 18 | Ht 62.0 in | Wt 149.7 lb

## 2022-02-25 DIAGNOSIS — Z01812 Encounter for preprocedural laboratory examination: Secondary | ICD-10-CM | POA: Diagnosis not present

## 2022-02-25 DIAGNOSIS — Z01818 Encounter for other preprocedural examination: Secondary | ICD-10-CM

## 2022-02-25 HISTORY — DX: Malignant (primary) neoplasm, unspecified: C80.1

## 2022-02-25 HISTORY — DX: Other specified postprocedural states: Z98.890

## 2022-02-25 HISTORY — DX: Nausea with vomiting, unspecified: R11.2

## 2022-02-25 HISTORY — DX: Migraine, unspecified, not intractable, without status migrainosus: G43.909

## 2022-02-25 LAB — TYPE AND SCREEN
ABO/RH(D): B POS
Antibody Screen: NEGATIVE

## 2022-02-25 LAB — SURGICAL PCR SCREEN
MRSA, PCR: NEGATIVE
Staphylococcus aureus: POSITIVE — AB

## 2022-02-25 NOTE — Progress Notes (Signed)
PCP - Idamae Schuller ?Cardiologist - patient denies ? ?PPM/ICD - n/a ?Device Orders -  ?Rep Notified -  ? ?Chest x-ray - n/a ?EKG - 01/28/21 ?Stress Test - patient denies ?ECHO - "years ago during breast cancer treatment in the 90's but none since then" ?Cardiac Cath - patient denies ? ?Sleep Study - n/a ?CPAP -  ? ?Fasting Blood Sugar - n/a ?Checks Blood Sugar _____ times a day ? ?Blood Thinner Instructions: ?Aspirin Instructions: hold 5-7 days per patient ? ?ERAS Protcol - clears until 0430 ?PRE-SURGERY Ensure or G2- none ordered ? ?COVID TEST- scheduled for Friday 3/10 ? ? ?Anesthesia review: n/a ? ?Patient denies shortness of breath, fever, cough and chest pain at PAT appointment ? ? ?All instructions explained to the patient, with a verbal understanding of the material. Patient agrees to go over the instructions while at home for a better understanding. Patient also instructed to self quarantine after being tested for COVID-19. The opportunity to ask questions was provided. ? ? ?

## 2022-03-04 ENCOUNTER — Other Ambulatory Visit (HOSPITAL_COMMUNITY)
Admission: RE | Admit: 2022-03-04 | Discharge: 2022-03-04 | Disposition: A | Discharge status: Home / Self Care | Payer: Medicare Other | Source: Ambulatory Visit | Attending: Neurological Surgery | Admitting: Neurological Surgery

## 2022-03-04 DIAGNOSIS — Z20822 Contact with and (suspected) exposure to covid-19: Secondary | ICD-10-CM | POA: Insufficient documentation

## 2022-03-04 DIAGNOSIS — Z01812 Encounter for preprocedural laboratory examination: Secondary | ICD-10-CM | POA: Insufficient documentation

## 2022-03-04 DIAGNOSIS — Z01818 Encounter for other preprocedural examination: Secondary | ICD-10-CM

## 2022-03-04 LAB — SARS CORONAVIRUS 2 (TAT 6-24 HRS): SARS Coronavirus 2: NEGATIVE

## 2022-03-07 ENCOUNTER — Ambulatory Visit (HOSPITAL_BASED_OUTPATIENT_CLINIC_OR_DEPARTMENT_OTHER): Payer: Medicare Other | Admitting: Vascular Surgery

## 2022-03-07 ENCOUNTER — Ambulatory Visit (HOSPITAL_COMMUNITY): Payer: Medicare Other

## 2022-03-07 ENCOUNTER — Other Ambulatory Visit: Payer: Self-pay

## 2022-03-07 ENCOUNTER — Observation Stay (HOSPITAL_COMMUNITY)
Admission: RE | Admit: 2022-03-07 | Discharge: 2022-03-08 | Disposition: A | Discharge status: Home Health | Payer: Medicare Other | Source: Ambulatory Visit | Attending: Neurological Surgery | Admitting: Neurological Surgery

## 2022-03-07 ENCOUNTER — Ambulatory Visit (HOSPITAL_COMMUNITY): Payer: Medicare Other | Admitting: Vascular Surgery

## 2022-03-07 ENCOUNTER — Encounter (HOSPITAL_COMMUNITY): Payer: Self-pay | Admitting: Neurological Surgery

## 2022-03-07 ENCOUNTER — Encounter (HOSPITAL_COMMUNITY)
Admission: RE | Disposition: A | Discharge status: Home Health | Payer: Self-pay | Source: Ambulatory Visit | Attending: Neurological Surgery

## 2022-03-07 DIAGNOSIS — Z853 Personal history of malignant neoplasm of breast: Secondary | ICD-10-CM | POA: Insufficient documentation

## 2022-03-07 DIAGNOSIS — Z419 Encounter for procedure for purposes other than remedying health state, unspecified: Secondary | ICD-10-CM

## 2022-03-07 DIAGNOSIS — M5416 Radiculopathy, lumbar region: Secondary | ICD-10-CM

## 2022-03-07 DIAGNOSIS — I10 Essential (primary) hypertension: Secondary | ICD-10-CM | POA: Diagnosis not present

## 2022-03-07 DIAGNOSIS — M4316 Spondylolisthesis, lumbar region: Secondary | ICD-10-CM | POA: Diagnosis present

## 2022-03-07 HISTORY — PX: TRANSFORAMINAL LUMBAR INTERBODY FUSION W/ MIS 1 LEVEL: SHX6145

## 2022-03-07 LAB — ABO/RH: ABO/RH(D): B POS

## 2022-03-07 IMAGING — RF DG LUMBAR SPINE 2-3V
1 series · 2 of 2 positions shown · non-contrast
Comparison: Lumbar spine MRI [DATE]

CLINICAL DATA: L4-5 fusion.

EXAM:
LUMBAR SPINE - 2-3 VIEW

[Series 1: run · 2 of 2 slices shown]
[im 1/2]
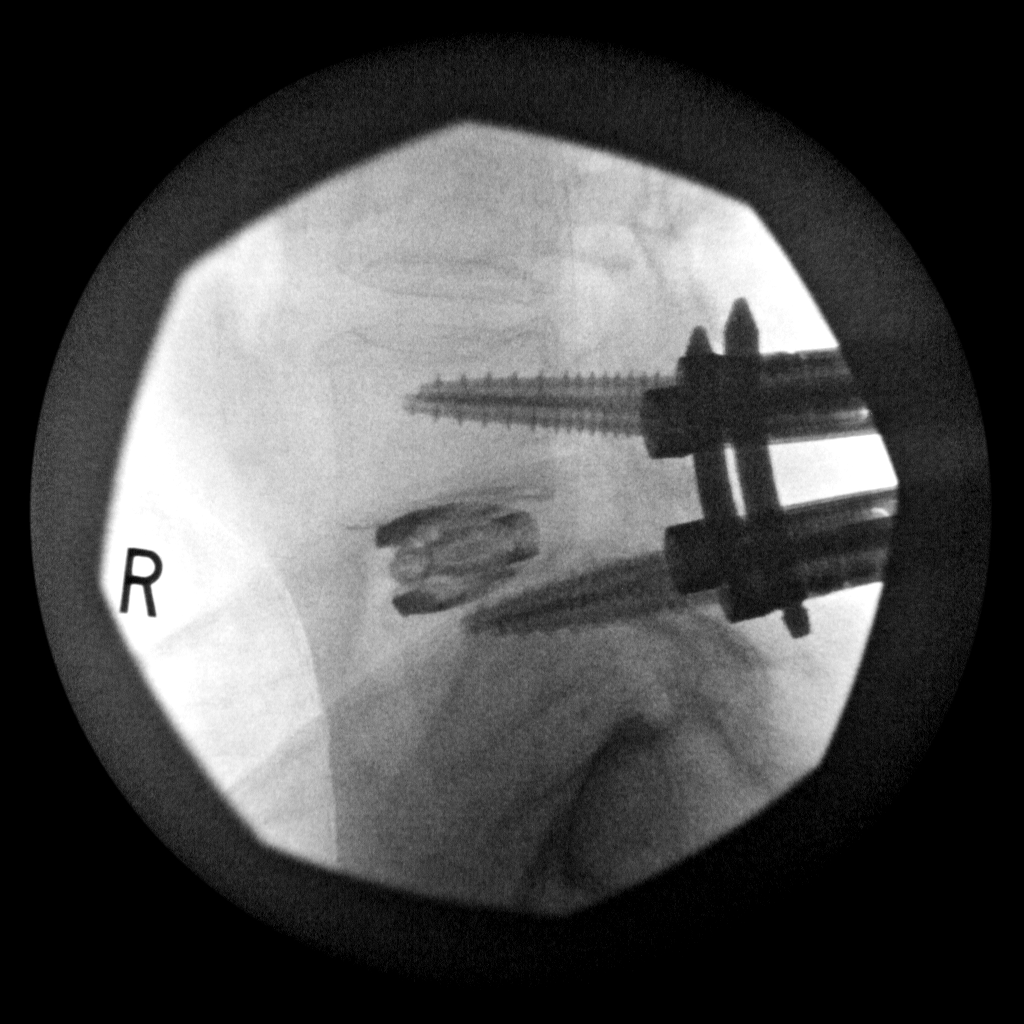
[im 2/2]
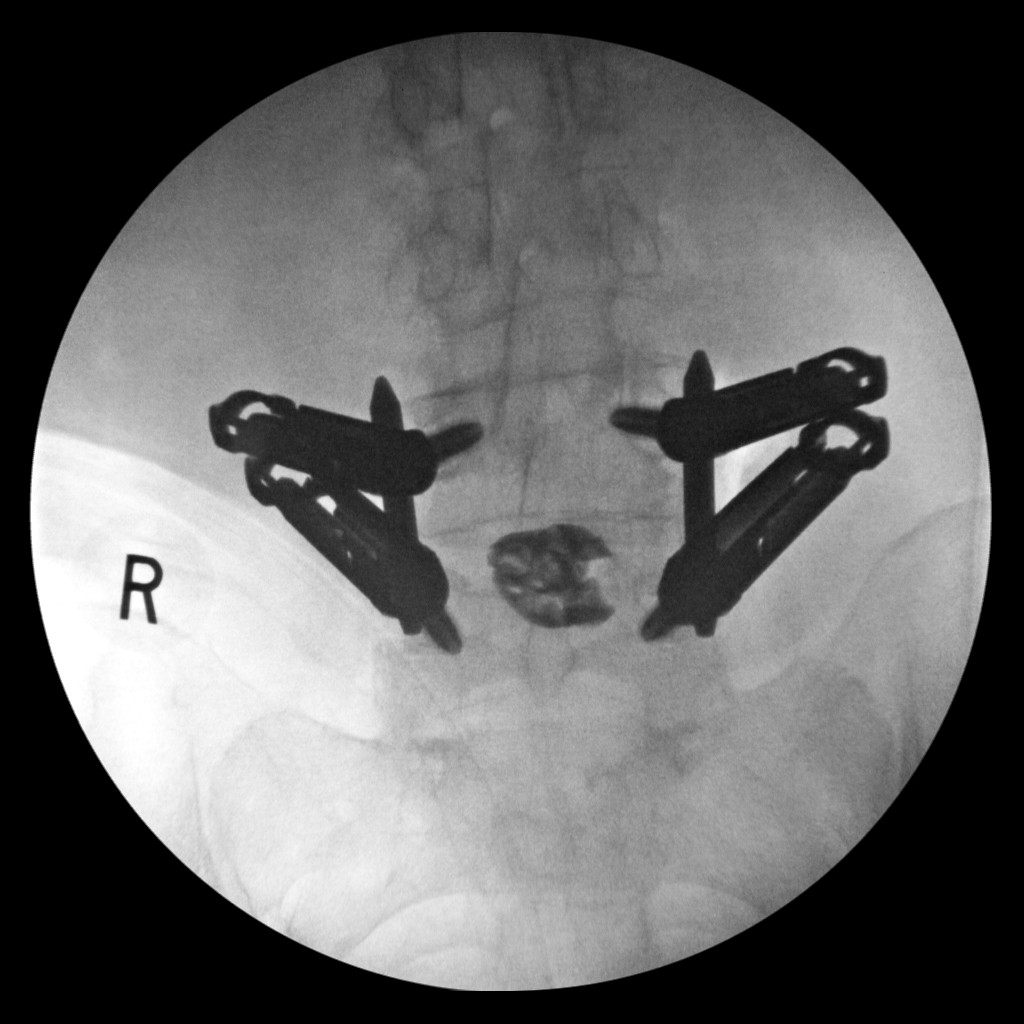

[2 of 2 positions shown; findings below may reference images not displayed]

FINDINGS: Using same numbering scheme as previous MRI, posterior fusion
hardware noted at L4-5 level with interbody hardware associated.
IMPRESSION: Intraoperative assessment during L4-5 fusion.

## 2022-03-07 SURGERY — MINIMALLY INVASIVE (MIS) TRANSFORAMINAL LUMBAR INTERBODY FUSION (TLIF) 1 LEVEL
Anesthesia: General

## 2022-03-07 MED ORDER — PHENOL 1.4 % MT LIQD
1.0000 | OROMUCOSAL | Status: DC | PRN
  Filled 2022-03-07: qty 177

## 2022-03-07 MED ORDER — OXYCODONE HCL 5 MG PO TABS: 10.0000 mg | ORAL_TABLET | ORAL | Status: DC | PRN

## 2022-03-07 MED ORDER — ONDANSETRON HCL 4 MG/2ML IJ SOLN
INTRAMUSCULAR | Status: AC
  Filled 2022-03-07: qty 2

## 2022-03-07 MED ORDER — SODIUM CHLORIDE 0.9 % IV SOLN
250.0000 mL | INTRAVENOUS | Status: DC
  Administered 2022-03-07: 250 mL via INTRAVENOUS

## 2022-03-07 MED ORDER — POLYVINYL ALCOHOL 1.4 % OP SOLN
1.0000 [drp] | OPHTHALMIC | Status: DC
  Filled 2022-03-07: qty 15

## 2022-03-07 MED ORDER — LIDOCAINE-EPINEPHRINE 1 %-1:100000 IJ SOLN
INTRAMUSCULAR | Status: DC | PRN
  Administered 2022-03-07: 10 mL

## 2022-03-07 MED ORDER — CHLORHEXIDINE GLUCONATE 0.12 % MT SOLN: 15.0000 mL | Freq: Once | OROMUCOSAL | Status: DC

## 2022-03-07 MED ORDER — HYDROMORPHONE HCL 1 MG/ML IJ SOLN
INTRAMUSCULAR | Status: AC
  Filled 2022-03-07: qty 1

## 2022-03-07 MED ORDER — CEFAZOLIN SODIUM-DEXTROSE 2-4 GM/100ML-% IV SOLN
2.0000 g | Freq: Three times a day (TID) | INTRAVENOUS | Status: AC
  Administered 2022-03-07 (×2): 2 g via INTRAVENOUS
  Filled 2022-03-07 (×3): qty 100

## 2022-03-07 MED ORDER — FENTANYL CITRATE (PF) 100 MCG/2ML IJ SOLN
INTRAMUSCULAR | Status: AC
  Filled 2022-03-07: qty 2

## 2022-03-07 MED ORDER — PHENYLEPHRINE 40 MCG/ML (10ML) SYRINGE FOR IV PUSH (FOR BLOOD PRESSURE SUPPORT)
PREFILLED_SYRINGE | INTRAVENOUS | Status: DC | PRN
  Administered 2022-03-07 (×2): 80 ug via INTRAVENOUS

## 2022-03-07 MED ORDER — ACETAMINOPHEN 325 MG PO TABS
650.0000 mg | ORAL_TABLET | ORAL | Status: DC | PRN
  Filled 2022-03-07: qty 2

## 2022-03-07 MED ORDER — SUGAMMADEX SODIUM 200 MG/2ML IV SOLN
INTRAVENOUS | Status: DC | PRN
Start: 2022-03-07 — End: 2022-03-07
  Administered 2022-03-07: 200 mg via INTRAVENOUS

## 2022-03-07 MED ORDER — ROCURONIUM BROMIDE 10 MG/ML (PF) SYRINGE
PREFILLED_SYRINGE | INTRAVENOUS | Status: DC | PRN
  Administered 2022-03-07: 70 mg via INTRAVENOUS

## 2022-03-07 MED ORDER — LIDOCAINE-EPINEPHRINE 1 %-1:100000 IJ SOLN
INTRAMUSCULAR | Status: AC
  Filled 2022-03-07: qty 1

## 2022-03-07 MED ORDER — MENTHOL 3 MG MT LOZG
1.0000 | LOZENGE | OROMUCOSAL | Status: DC | PRN
  Filled 2022-03-07: qty 9

## 2022-03-07 MED ORDER — PROPOFOL 10 MG/ML IV BOLUS
INTRAVENOUS | Status: AC
  Filled 2022-03-07: qty 20

## 2022-03-07 MED ORDER — HYDROXYZINE HCL 50 MG/ML IM SOLN
50.0000 mg | Freq: Four times a day (QID) | INTRAMUSCULAR | Status: DC | PRN
  Administered 2022-03-07: 50 mg via INTRAMUSCULAR
  Filled 2022-03-07: qty 1

## 2022-03-07 MED ORDER — PROPOFOL 10 MG/ML IV BOLUS
INTRAVENOUS | Status: DC | PRN
  Administered 2022-03-07: 30 mg via INTRAVENOUS
  Administered 2022-03-07: 20 mg via INTRAVENOUS
  Administered 2022-03-07: 90 mg via INTRAVENOUS

## 2022-03-07 MED ORDER — GABAPENTIN 100 MG PO CAPS
100.0000 mg | ORAL_CAPSULE | Freq: Every day | ORAL | Status: DC
  Administered 2022-03-07: 100 mg via ORAL
  Filled 2022-03-07 (×2): qty 1

## 2022-03-07 MED ORDER — DEXAMETHASONE SODIUM PHOSPHATE 10 MG/ML IJ SOLN
INTRAMUSCULAR | Status: AC
  Filled 2022-03-07: qty 1

## 2022-03-07 MED ORDER — HYDROMORPHONE HCL 1 MG/ML IJ SOLN: 1.0000 mg | INTRAMUSCULAR | Status: DC | PRN

## 2022-03-07 MED ORDER — LACTATED RINGERS IV SOLN: INTRAVENOUS | Status: DC | PRN

## 2022-03-07 MED ORDER — FENTANYL CITRATE (PF) 100 MCG/2ML IJ SOLN
25.0000 ug | INTRAMUSCULAR | Status: DC | PRN
  Administered 2022-03-07 (×2): 50 ug via INTRAVENOUS
  Administered 2022-03-07: 25 ug via INTRAVENOUS
  Administered 2022-03-07: 50 ug via INTRAVENOUS
  Administered 2022-03-07: 25 ug via INTRAVENOUS

## 2022-03-07 MED ORDER — DOCUSATE SODIUM 100 MG PO CAPS
100.0000 mg | ORAL_CAPSULE | Freq: Two times a day (BID) | ORAL | Status: DC
  Administered 2022-03-07 – 2022-03-08 (×2): 100 mg via ORAL
  Filled 2022-03-07 (×3): qty 1

## 2022-03-07 MED ORDER — FENTANYL CITRATE (PF) 250 MCG/5ML IJ SOLN
INTRAMUSCULAR | Status: AC
  Filled 2022-03-07: qty 5

## 2022-03-07 MED ORDER — ROCURONIUM BROMIDE 10 MG/ML (PF) SYRINGE
PREFILLED_SYRINGE | INTRAVENOUS | Status: AC
  Filled 2022-03-07: qty 10

## 2022-03-07 MED ORDER — THROMBIN 5000 UNITS EX SOLR
CUTANEOUS | Status: AC
  Filled 2022-03-07: qty 5000

## 2022-03-07 MED ORDER — CEFAZOLIN SODIUM-DEXTROSE 2-4 GM/100ML-% IV SOLN
2.0000 g | INTRAVENOUS | Status: AC
  Administered 2022-03-07: 2 g via INTRAVENOUS
  Filled 2022-03-07: qty 100

## 2022-03-07 MED ORDER — LOSARTAN POTASSIUM 50 MG PO TABS
100.0000 mg | ORAL_TABLET | Freq: Every day | ORAL | Status: DC
  Administered 2022-03-07 – 2022-03-08 (×2): 100 mg via ORAL
  Filled 2022-03-07 (×3): qty 2

## 2022-03-07 MED ORDER — HYDROMORPHONE HCL 1 MG/ML IJ SOLN
0.2500 mg | INTRAMUSCULAR | Status: DC | PRN
  Administered 2022-03-07: 0.5 mg via INTRAVENOUS

## 2022-03-07 MED ORDER — PHENYLEPHRINE HCL-NACL 20-0.9 MG/250ML-% IV SOLN
INTRAVENOUS | Status: DC | PRN
  Administered 2022-03-07: 25 ug/min via INTRAVENOUS

## 2022-03-07 MED ORDER — DEXAMETHASONE SODIUM PHOSPHATE 10 MG/ML IJ SOLN
INTRAMUSCULAR | Status: DC | PRN
  Administered 2022-03-07: 10 mg via INTRAVENOUS

## 2022-03-07 MED ORDER — CHLORHEXIDINE GLUCONATE 0.12 % MT SOLN
OROMUCOSAL | Status: AC
  Filled 2022-03-07: qty 15

## 2022-03-07 MED ORDER — SODIUM CHLORIDE 0.9% FLUSH
3.0000 mL | Freq: Two times a day (BID) | INTRAVENOUS | Status: DC
  Administered 2022-03-07 (×2): 3 mL via INTRAVENOUS

## 2022-03-07 MED ORDER — PANTOPRAZOLE SODIUM 20 MG PO TBEC
20.0000 mg | DELAYED_RELEASE_TABLET | Freq: Every day | ORAL | Status: DC
  Administered 2022-03-08: 20 mg via ORAL
  Filled 2022-03-07 (×2): qty 1

## 2022-03-07 MED ORDER — LIDOCAINE 2% (20 MG/ML) 5 ML SYRINGE
INTRAMUSCULAR | Status: AC
  Filled 2022-03-07: qty 5

## 2022-03-07 MED ORDER — MUPIROCIN 2 % EX OINT
TOPICAL_OINTMENT | CUTANEOUS | Status: AC
  Filled 2022-03-07: qty 22

## 2022-03-07 MED ORDER — ONDANSETRON HCL 4 MG/2ML IJ SOLN
INTRAMUSCULAR | Status: DC | PRN
  Administered 2022-03-07: 4 mg via INTRAVENOUS

## 2022-03-07 MED ORDER — CYCLOBENZAPRINE HCL 10 MG PO TABS
10.0000 mg | ORAL_TABLET | Freq: Three times a day (TID) | ORAL | Status: DC | PRN
  Administered 2022-03-07: 10 mg via ORAL
  Filled 2022-03-07 (×2): qty 1

## 2022-03-07 MED ORDER — 0.9 % SODIUM CHLORIDE (POUR BTL) OPTIME
TOPICAL | Status: DC | PRN
  Administered 2022-03-07: 1000 mL

## 2022-03-07 MED ORDER — ACETAMINOPHEN 650 MG RE SUPP
650.0000 mg | RECTAL | Status: DC | PRN
  Filled 2022-03-07: qty 1

## 2022-03-07 MED ORDER — ONDANSETRON HCL 4 MG/2ML IJ SOLN
4.0000 mg | Freq: Four times a day (QID) | INTRAMUSCULAR | Status: DC | PRN
  Filled 2022-03-07: qty 2

## 2022-03-07 MED ORDER — FENTANYL CITRATE (PF) 250 MCG/5ML IJ SOLN
INTRAMUSCULAR | Status: DC | PRN
  Administered 2022-03-07 (×4): 50 ug via INTRAVENOUS
  Administered 2022-03-07: 150 ug via INTRAVENOUS

## 2022-03-07 MED ORDER — KETOROLAC TROMETHAMINE 0.5 % OP SOLN
1.0000 [drp] | Freq: Three times a day (TID) | OPHTHALMIC | Status: DC | PRN
  Administered 2022-03-07: 1 [drp] via OPHTHALMIC

## 2022-03-07 MED ORDER — LIDOCAINE 2% (20 MG/ML) 5 ML SYRINGE
INTRAMUSCULAR | Status: DC | PRN
  Administered 2022-03-07: 60 mg via INTRAVENOUS

## 2022-03-07 MED ORDER — OXYCODONE HCL 5 MG PO TABS: 5.0000 mg | ORAL_TABLET | ORAL | Status: DC | PRN

## 2022-03-07 MED ORDER — CHLORHEXIDINE GLUCONATE CLOTH 2 % EX PADS: 6.0000 | MEDICATED_PAD | Freq: Once | CUTANEOUS | Status: DC

## 2022-03-07 MED ORDER — ACETAMINOPHEN-CODEINE #3 300-30 MG PO TABS
1.0000 | ORAL_TABLET | ORAL | Status: DC | PRN
  Administered 2022-03-07: 2 via ORAL
  Administered 2022-03-07 – 2022-03-08 (×4): 1 via ORAL
  Filled 2022-03-07: qty 2
  Filled 2022-03-07 (×3): qty 1
  Filled 2022-03-07: qty 2

## 2022-03-07 MED ORDER — LACTATED RINGERS IV SOLN: INTRAVENOUS | Status: DC

## 2022-03-07 MED ORDER — THROMBIN 5000 UNITS EX SOLR
OROMUCOSAL | Status: DC | PRN
  Administered 2022-03-07: 5 mL via TOPICAL

## 2022-03-07 MED ORDER — KETOROLAC TROMETHAMINE 0.5 % OP SOLN
OPHTHALMIC | Status: AC
Start: 2022-03-07 — End: 2022-03-08
  Filled 2022-03-07: qty 5

## 2022-03-07 MED ORDER — AMLODIPINE BESYLATE 5 MG PO TABS
5.0000 mg | ORAL_TABLET | Freq: Every day | ORAL | Status: DC
  Administered 2022-03-08: 5 mg via ORAL
  Filled 2022-03-07 (×2): qty 1

## 2022-03-07 MED ORDER — ORAL CARE MOUTH RINSE: 15.0000 mL | Freq: Once | OROMUCOSAL | Status: DC

## 2022-03-07 MED ORDER — ONDANSETRON HCL 4 MG PO TABS
4.0000 mg | ORAL_TABLET | Freq: Four times a day (QID) | ORAL | Status: DC | PRN
  Administered 2022-03-08: 4 mg via ORAL
  Filled 2022-03-07 (×2): qty 1

## 2022-03-07 MED ORDER — SODIUM CHLORIDE 0.9% FLUSH: 3.0000 mL | INTRAVENOUS | Status: DC | PRN

## 2022-03-07 MED ORDER — POLYETHYLENE GLYCOL 3350 17 G PO PACK
17.0000 g | PACK | Freq: Every day | ORAL | Status: DC | PRN
  Filled 2022-03-07: qty 1

## 2022-03-07 SURGICAL SUPPLY — 66 items
BAG COUNTER SPONGE SURGICOUNT (BAG) ×3 IMPLANT
BAND RUBBER #18 3X1/16 STRL (MISCELLANEOUS) ×4 IMPLANT
BASKET BONE COLLECTION (BASKET) ×2 IMPLANT
BLADE CLIPPER SURG (BLADE) IMPLANT
BLADE SURG 11 STRL SS (BLADE) ×1 IMPLANT
BUR MATCHSTICK NEURO 3.0 LAGG (BURR) IMPLANT
BUR ROUND PRECISION 4.0 (BURR) ×2 IMPLANT
CNTNR URN SCR LID CUP LEK RST (MISCELLANEOUS) ×1 IMPLANT
CONT SPEC 4OZ STRL OR WHT (MISCELLANEOUS) ×1
COVER BACK TABLE 60X90IN (DRAPES) ×2 IMPLANT
DECANTER SPIKE VIAL GLASS SM (MISCELLANEOUS) ×1 IMPLANT
DERMABOND ADVANCED (GAUZE/BANDAGES/DRESSINGS) ×1
DERMABOND ADVANCED .7 DNX12 (GAUZE/BANDAGES/DRESSINGS) ×1 IMPLANT
DRAPE C-ARM 42X72 X-RAY (DRAPES) ×2 IMPLANT
DRAPE C-ARMOR (DRAPES) ×2 IMPLANT
DRAPE LAPAROTOMY 100X72X124 (DRAPES) ×2 IMPLANT
DRAPE MICROSCOPE LEICA (MISCELLANEOUS) ×2 IMPLANT
DRAPE SURG 17X23 STRL (DRAPES) ×2 IMPLANT
ELECT BLADE 6.5 EXT (BLADE) ×2 IMPLANT
ELECT REM PT RETURN 9FT ADLT (ELECTROSURGICAL) ×2
ELECTRODE REM PT RTRN 9FT ADLT (ELECTROSURGICAL) ×1 IMPLANT
EXTENDER TAB GUIDE SV 5.5/6.0 (INSTRUMENTS) ×8 IMPLANT
GAUZE 4X4 16PLY ~~LOC~~+RFID DBL (SPONGE) ×1 IMPLANT
GAUZE SPONGE 4X4 12PLY STRL (GAUZE/BANDAGES/DRESSINGS) ×1 IMPLANT
GLOVE SURG LTX SZ7.5 (GLOVE) ×2 IMPLANT
GLOVE SURG POLYISO LF SZ6.5 (GLOVE) ×2 IMPLANT
GLOVE SURG UNDER POLY LF SZ7.5 (GLOVE) ×2 IMPLANT
GOWN STRL REUS W/ TWL LRG LVL3 (GOWN DISPOSABLE) ×1 IMPLANT
GOWN STRL REUS W/ TWL XL LVL3 (GOWN DISPOSABLE) IMPLANT
GOWN STRL REUS W/TWL 2XL LVL3 (GOWN DISPOSABLE) IMPLANT
GOWN STRL REUS W/TWL LRG LVL3 (GOWN DISPOSABLE) ×3
GOWN STRL REUS W/TWL XL LVL3 (GOWN DISPOSABLE) ×1
GUIDEWIRE BLUNT NT 450 (WIRE) ×4 IMPLANT
HEMOSTAT POWDER KIT SURGIFOAM (HEMOSTASIS) ×2 IMPLANT
KIT BASIN OR (CUSTOM PROCEDURE TRAY) ×2 IMPLANT
KIT INFUSE XX SMALL 0.7CC (Orthopedic Implant) ×1 IMPLANT
KIT POSITION SURG JACKSON T1 (MISCELLANEOUS) ×2 IMPLANT
KIT TURNOVER KIT B (KITS) IMPLANT
NDL BEVEL TWO-PAK W/1PK (NEEDLE) IMPLANT
NDL HYPO 18GX1.5 BLUNT FILL (NEEDLE) IMPLANT
NDL SPNL 18GX3.5 QUINCKE PK (NEEDLE) IMPLANT
NEEDLE BEVEL TWO-PAK W/1PK (NEEDLE) ×2 IMPLANT
NEEDLE HYPO 18GX1.5 BLUNT FILL (NEEDLE) IMPLANT
NEEDLE HYPO 22GX1.5 SAFETY (NEEDLE) ×2 IMPLANT
NEEDLE SPNL 18GX3.5 QUINCKE PK (NEEDLE) IMPLANT
NS IRRIG 1000ML POUR BTL (IV SOLUTION) ×2 IMPLANT
PACK LAMINECTOMY NEURO (CUSTOM PROCEDURE TRAY) ×2 IMPLANT
PAD ARMBOARD 7.5X6 YLW CONV (MISCELLANEOUS) ×3 IMPLANT
ROD 5.5 CCM PERC 40 (Rod) ×1 IMPLANT
ROD PERC CCM 5.5X35 (Rod) ×1 IMPLANT
SCREW MAS VOYAGER 6.5X35 (Screw) ×2 IMPLANT
SCREW MAS VOYAGER 6.5X40 (Screw) ×2 IMPLANT
SCREW SET 5.5/6.0MM SOLERA (Screw) ×4 IMPLANT
SPACER PL CATALYFT LONG 11 (Spacer) ×1 IMPLANT
SPONGE T-LAP 4X18 ~~LOC~~+RFID (SPONGE) ×1 IMPLANT
SUT MNCRL AB 3-0 PS2 18 (SUTURE) ×2 IMPLANT
SUT VIC AB 0 CT1 18XCR BRD8 (SUTURE) IMPLANT
SUT VIC AB 0 CT1 8-18 (SUTURE)
SUT VIC AB 2-0 CP2 18 (SUTURE) ×3 IMPLANT
SYR 3ML LL SCALE MARK (SYRINGE) IMPLANT
TOWEL GREEN STERILE (TOWEL DISPOSABLE) ×2 IMPLANT
TOWEL GREEN STERILE FF (TOWEL DISPOSABLE) ×2 IMPLANT
TRAY FOL W/BAG SLVR 16FR STRL (SET/KITS/TRAYS/PACK) IMPLANT
TRAY FOLEY MTR SLVR 16FR STAT (SET/KITS/TRAYS/PACK) IMPLANT
TRAY FOLEY W/BAG SLVR 16FR LF (SET/KITS/TRAYS/PACK) ×1
WATER STERILE IRR 1000ML POUR (IV SOLUTION) ×2 IMPLANT

## 2022-03-07 NOTE — Discharge Summary (Signed)
Discharge Summary  Date of Admission: 03/07/2022  Date of Discharge: 03/08/2022  Attending Physician: Autumn Patty, MD  Hospital Course: Patient was admitted following an uncomplicated L4-5 MIS TLIF. They were recovered in PACU and transferred to [facility]. Her preop radicular symptoms were completely resolved, their hospital course was uncomplicated and the patient was discharged home on 03/08/22. They will follow up in clinic with me in clinic in 2 weeks.  Neurologic exam at discharge:  Strength 5/5 x4 and SILTx4   Discharge diagnosis: Lumbar spondylolisthesis, lumbar radiculopathy  Jadene Pierini, MD 03/07/22 10:57 AM

## 2022-03-07 NOTE — Op Note (Addendum)
PATIENT: Victoria Sharp ? ?DAY OF SURGERY: 03/07/22 ?  ?PRE-OPERATIVE DIAGNOSIS:  Lumbar radiculopathy, lumbar spondylolisthesis ?  ?POST-OPERATIVE DIAGNOSIS:  Same ?  ?PROCEDURE: Right L4-L5 minimally invasive transforaminal lumbar interbody fusion with bilateral L4-L5 pedicle screw placement, L4-5 decompressive laminectomy ?  ?SURGEON:  Surgeon(s) and Role: ?   Judith Part, MD - Primary ?  ?ANESTHESIA: ETGA ?  ?BRIEF HISTORY: This is a 81 year old woman who presented with progressive refractory low back and RLE pain. 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 This was discussed with the patient as well as risks, benefits, and alternatives and wished to proceed with surgery. ?  ?OPERATIVE DETAIL:  The patient was taken to the operating room and placed on the OR table in the prone position. A formal time out was performed with two patient identifiers and confirmed the operative site. Anesthesia was induced by the anesthesia team. The operative site was marked, hair was clipped with surgical clippers, the area was then prepped and draped in a sterile fashion.  ? ?Fluoroscopy was used to localize the surgical level. The pedicles were marked and used to create skin incisions bilaterally. With fluoro guidance, Jamshidi needles were used to guide K-wires into the bilateral L4 and L5 pedicles. The K wires were then secured with hemostats and attention turned to the TLIF. ? ?A MetRx tube was then docked to the right L4-5 facet through the same incision using fluoroscopy. A right L4-5 facetectomy was performed and the traversing nerve root was decompressed along its entire course. The tube was wanded medially and the decompression was continued medially until reaching the contralateral foramen. Of note, this decompression was more than what was needed for instrumentation alone and was for the purpose of central canal and lateral recess decompression. The  tube was wanded back to the disc space. The disc space was identified, incised, and a discectomy was performed in the standard fashion. The endplates were prepped, bone graft was packed into the disc space, and an expandable cage ([employer]) was packed with autograft and placed into the disc space with fluoroscopic confirmation. The tube was removed and hemostasis was obtained during its removal.  ? ?Using the previously placed K wires, a tap and then screw with tower were placed bilaterally at L4 and L5. A rod was sized and introduced on both sides, confirmed with fluoroscopy, then final tightened. Hemostasis was again confirmed for both incisions, they were copiously irrigated, and then closed in layers.  ?  ?EBL:  112m ?  ?DRAINS: none ?  ?SPECIMENS: none ?  ?TJudith Part MD ?03/07/22 ?7:37 AM ? ?

## 2022-03-07 NOTE — Progress Notes (Signed)
Neurosurgery Service ?Post-operative progress note ? ?Assessment & Plan: ?81 y.o. woman s/p L4-5 MIS decompression and TLIF, seen in PACU, MAEx4, recovering well. ? ?-transfer to [facility] ?-PT/OT in AM ? ?Marcello Moores A Kasmira Cacioppo  ?03/07/22 ?11:31 AM ? ? ?

## 2022-03-07 NOTE — Anesthesia Preprocedure Evaluation (Signed)
Anesthesia Evaluation  ?Patient identified by MRN, date of birth, ID band ?Patient awake ? ? ? ?Reviewed: ?Allergy & Precautions, NPO status , Patient's Chart, lab work & pertinent test results ? ?History of Anesthesia Complications ?(+) PONV and history of anesthetic complications ? ?Airway ?Mallampati: II ? ?TM Distance: >3 FB ? ? ? ? Dental ? ?(+) Teeth Intact, Dental Advisory Given ?  ?Pulmonary ?neg pulmonary ROS,  ?  ?breath sounds clear to auscultation ? ? ? ? ? ? Cardiovascular ?hypertension, Pt. on medications ?(-) angina(-) Past MI and (-) CHF  ?Rhythm:Regular  ? ?  ?Neuro/Psych ? Headaches, neg Seizures TIAnegative psych ROS  ? GI/Hepatic ?Neg liver ROS, GERD  Medicated and Controlled,  ?Endo/Other  ?negative endocrine ROS ? Renal/GU ?negative Renal ROS  ? ?  ?Musculoskeletal ? ?(+) Arthritis , Rheumatoid disorders,   ? Abdominal ?  ?Peds ? Hematology ?negative hematology ROS ?(+)   ?Anesthesia Other Findings ? ? Reproductive/Obstetrics ? ?  ? ? ? ? ? ? ? ? ? ? ? ? ? ?  ?  ? ? ? ? ? ? ? ? ?Anesthesia Physical ?Anesthesia Plan ? ?ASA: 2 ? ?Anesthesia Plan: General  ? ?Post-op Pain Management: Tylenol PO (pre-op)*  ? ?Induction: Intravenous ? ?PONV Risk Score and Plan: 4 or greater and Ondansetron and Dexamethasone ? ?Airway Management Planned: Oral ETT ? ?Additional Equipment: None ? ?Intra-op Plan:  ? ?Post-operative Plan: Extubation in OR ? ?Informed Consent: I have reviewed the patients History and Physical, chart, labs and discussed the procedure including the risks, benefits and alternatives for the proposed anesthesia with the patient or authorized representative who has indicated his/her understanding and acceptance.  ? ? ? ?Dental advisory given ? ?Plan Discussed with: CRNA and Anesthesiologist ? ?Anesthesia Plan Comments:   ? ? ? ? ? ? ?Anesthesia Quick Evaluation ? ?

## 2022-03-07 NOTE — TOC Progression Note (Signed)
Transition of Care (TOC) - Progression Note  ? ? ?Patient Details  ?Name: RUBLE BUTTLER ?MRN: [id number] ?Date of Birth: 05/29/1941 ? ?Transition of Care (TOC) CM/SW Contact  ?Marilu Favre, RN ?Phone Number: ?03/07/2022, 4:26 PM ? ?Clinical Narrative:    ? ? ?Dr Zada Finders office has arranged home health services with [facility], spoke with Thayer Headings with [phone].  ? ?3C staff will provide any needed DME ?  ?  ?Transition of Care (TOC) Screening Note ? ? ?Patient Details  ?Name: VYLET MAFFIA ?Date of Birth: 12/07/1941 ? ? ? ? ? ?Transition of Care Department [facility]) has reviewed patient and no TOC needs have been identified at this time. We will continue to monitor patient advancement through interdisciplinary progression rounds. If new patient transition needs arise, please place a TOC consult. ?  ? ?Expected Discharge Plan and Services ?  ?  ?  ?  ?  ?                ?  ?  ?  ?  ?  ?  ?  ?  ?  ?  ? ? ?Social Determinants of Health (SDOH) Interventions ?  ? ?Readmission Risk Interventions ?No flowsheet data found. ? ?

## 2022-03-07 NOTE — Anesthesia Procedure Notes (Signed)
Procedure Name: Intubation ?Date/Time: 03/07/2022 7:48 AM ?Performed by: Harden Mo, CRNA ?Pre-anesthesia Checklist: Patient identified, Emergency Drugs available, Suction available and Patient being monitored ?Patient Re-evaluated:Patient Re-evaluated prior to induction ?Oxygen Delivery Method: Circle System Utilized ?Preoxygenation: Pre-oxygenation with 100% oxygen ?Induction Type: IV induction ?Ventilation: Mask ventilation without difficulty ?Laryngoscope Size: Sabra Heck and 2 ?Grade View: Grade II ?Tube type: Oral ?Tube size: 7.0 mm ?Number of attempts: 1 ?Airway Equipment and Method: Stylet and Oral airway ?Placement Confirmation: ETT inserted through vocal cords under direct vision, positive ETCO2 and breath sounds checked- equal and bilateral ?Secured at: 22 cm ?Tube secured with: Tape ?Dental Injury: Teeth and Oropharynx as per pre-operative assessment  ? ? ? ? ?

## 2022-03-07 NOTE — H&P (Signed)
Surgical H&P Update ? ?HPI: 81 y.o. with a history of RLE and low back pain. Workup showed a mobile spondylolisthesis with canal and foraminal stenosis at L4-5. No changes in health since they were last seen. Still having the above and wishes to proceed with surgery. ? ?PMHx:  ?Past Medical History:  ?Diagnosis Date  ? Cancer [facility])   ? Dermatitis   ? History of breast cancer   ? Hypertension   ? Migraines   ? PONV (postoperative nausea and vomiting)   ? Rheumatoid arthritis ([place])   ? Tachycardia   ? TIA (transient ischemic attack) 01/2020  ? questionable TIA vs migraine per neurologist  ? ?FamHx:  ?Family History  ?Problem Relation Age of Onset  ? Heart attack Father   ? Kidney disease Brother   ? Cancer Brother   ? ?SocHx:  reports that she has never smoked. She has never used smokeless tobacco. She reports current alcohol use. She reports that she does not use drugs. ? ?Physical Exam: ?Strength 5/5 x4 and SILTx4  ? ?Assesment/Plan: ?81 y.o. woman with RLE radicular pain, LBP 2/2 L4-5 mobile spondy with stenosis, here for L4-5 MIS TLIF. Risks, benefits, and alternatives discussed and the patient would like to continue with surgery. ? ?-OR today ?-3C post-op ? ?Judith Part, MD ?03/07/22 ?7:32 AM ? ?

## 2022-03-07 NOTE — Transfer of Care (Signed)
Immediate Anesthesia Transfer of Care Note ? ?Patient: Victoria Sharp ? ?Procedure(s) Performed: LUMBAR FOUR- FIVE,  MINIMALLY  INVASIVE TRANSFORAMINAL LUMBAR  INTERBODY FUSION  w/ METRIX ? ?Patient Location: PACU ? ?Anesthesia Type:General ? ?Level of Consciousness: awake and drowsy ? ?Airway & Oxygen Therapy: Patient Spontanous Breathing and Patient connected to nasal cannula oxygen ? ?Post-op Assessment: Report given to RN, Post -op Vital signs reviewed and stable and Patient moving all extremities X 4 ? ?Post vital signs: Reviewed and stable ? ?Last Vitals:  ?Vitals Value Taken Time  ?BP 138/88 03/07/22 1108  ?Temp    ?Pulse 89 03/07/22 1110  ?Resp 16 03/07/22 1110  ?SpO2 95 % 03/07/22 1110  ?Vitals shown include unvalidated device data. ? ?Last Pain:  ?Vitals:  ? 03/07/22 0553  ?TempSrc:   ?PainSc: 5   ?   ? ?Patients Stated Pain Goal: 3 (03/07/22 0553) ? ?Complications: No notable events documented. ?

## 2022-03-08 ENCOUNTER — Other Ambulatory Visit: Payer: Self-pay

## 2022-03-08 DIAGNOSIS — M5416 Radiculopathy, lumbar region: Secondary | ICD-10-CM | POA: Diagnosis not present

## 2022-03-08 MED ORDER — OXYCODONE HCL 5 MG PO TABS: 5.0000 mg | ORAL_TABLET | ORAL | 0 refills | Status: DC | PRN

## 2022-03-08 MED ORDER — ACETAMINOPHEN-CODEINE #3 300-30 MG PO TABS: 1.0000 | ORAL_TABLET | ORAL | 0 refills | Status: DC | PRN

## 2022-03-08 MED ORDER — CYCLOBENZAPRINE HCL 10 MG PO TABS
10.0000 mg | ORAL_TABLET | Freq: Three times a day (TID) | ORAL | 0 refills | Status: DC | PRN
Start: 2022-03-08 — End: 2022-04-14

## 2022-03-08 NOTE — Evaluation (Addendum)
Occupational Therapy Evaluation ?Patient Details ?Name: Victoria Sharp ?MRN: [id number] ?DOB: 1941/03/30 ?Today's Date: 03/08/2022 ? ? ?History of Present Illness  81 yo female s/p L4-5 lami on 3/13. PMH including cancer, HTN, and RA.   ? ?Clinical Impression ?  ?PTA, pt was living with her husband and was independent; pt performs majority of IADLs as her husband has had a stroke and has decreased balance. Currently, pt requires Supervision for ADLs and functional mobility using RW. Provided education and handout on back precautions, bed mobility, brace management, grooming, LB ADLs, toileting, and shower transfer; pt demonstrated understanding. Pt would benefit from further acute OT to facilitate safe dc. Recommend dc to home with HHOT for further OT to optimize safety, independence with ADLs, and return to PLOF.    ? ?Recommendations for follow up therapy are one component of a multi-disciplinary discharge planning process, led by the attending physician.  Recommendations may be updated based on patient status, additional functional criteria and insurance authorization.  ? ?Follow Up Recommendations ? Home health OT  ?  ?Assistance Recommended at Discharge Intermittent Supervision/Assistance  ?Patient can return home with the following A little help with walking and/or transfers ? ?  ?Functional Status Assessment ? Patient has had a recent decline in their functional status and demonstrates the ability to make significant improvements in function in a reasonable and predictable amount of time.  ?Equipment Recommendations ? BSC/3in1  ?  ?Recommendations for Other Services   ? ? ?  ?Precautions / Restrictions Precautions ?Precautions: Back ?Precaution Booklet Issued: Yes (comment) ?Required Braces or Orthoses: Spinal Brace ?Spinal Brace: Lumbar corset (For comfort)  ? ?  ? ?Mobility Bed Mobility ?  ?  ?  ?  ?  ?  ?  ?General bed mobility comments: Sitting at EOB ?  ? ?Transfers ?Overall transfer level: Needs  assistance ?  ?Transfers: Sit to/from Stand ?Sit to Stand: Supervision ?  ?  ?  ?  ?  ?General transfer comment: Supervision for safety and cues for weight shift ?  ? ?  ?Balance Overall balance assessment: Mild deficits observed, not formally tested ?  ?  ?  ?  ?  ?  ?  ?  ?  ?  ?  ?  ?  ?  ?  ?  ?  ?  ?   ? ?ADL either performed or assessed with clinical judgement  ? ?ADL Overall ADL's : Needs assistance/impaired ?  ?  ?  ?  ?  ?  ?  ?  ?  ?  ?  ?  ?  ?  ?  ?  ?  ?  ?  ?General ADL Comments: Providing education on back precautions, bed mobility, LB ADLs, brace management, grooming, toileting, and shower transfer. Performing ADLs at Supervision level.  ? ? ? ?Vision Baseline Vision/History: 1 Wears glasses ?   ?   ?Perception   ?  ?Praxis   ?  ? ?Pertinent Vitals/Pain Pain Assessment ?Pain Assessment: Faces ?Faces Pain Scale: Hurts a little bit ?Pain Location: Back ?Pain Descriptors / Indicators: Discomfort ?Pain Intervention(s): Monitored during session, Repositioned  ? ? ? ?Hand Dominance   ?  ?Extremity/Trunk Assessment Upper Extremity Assessment ?Upper Extremity Assessment: Overall WFL for tasks assessed ?  ?Lower Extremity Assessment ?Lower Extremity Assessment: Defer to PT evaluation ?  ?Cervical / Trunk Assessment ?Cervical / Trunk Assessment: Back Surgery ?  ?Communication Communication ?Communication: No difficulties ?  ?Cognition Arousal/Alertness: Awake/alert ?Behavior During Therapy: [facility]  for tasks assessed/performed ?Overall Cognitive Status: Within Functional Limits for tasks assessed ?  ?  ?  ?  ?  ?  ?  ?  ?  ?  ?  ?  ?  ?  ?  ?  ?General Comments: Slightly anxious about dc to home ?  ?  ?General Comments    ? ?  ?Exercises   ?  ?Shoulder Instructions    ? ? ?Home Living Family/patient expects to be discharged to:: Private residence ?Living Arrangements: Spouse/significant other ?Available Help at Discharge: Family;Available 24 hours/day (Husband 24/7 but he "isn't healthy" and son can check in -  lives nearby but works) ?Type of Home: House ?Home Access: Stairs to enter ?  ?  ?Home Layout: Two level;Able to live on main level with bedroom/bathroom ?  ?  ?Bathroom Shower/Tub: Walk-in shower ?  ?Bathroom Toilet: Standard ?  ?  ?Home Equipment: Rollator (4 wheels);Grab bars - tub/shower;Grab bars - toilet;Shower seat - built in;Hand held shower head;Other (comment) (Adjustable bed) ?  ?  ?  ? ?  ?Prior Functioning/Environment Prior Level of Function : Independent/Modified Independent;Driving ?  ?  ?  ?  ?  ?  ?  ?  ?  ? ?  ?  ?OT Problem List: Decreased strength;Decreased range of motion;Decreased activity tolerance;Impaired balance (sitting and/or standing);Decreased knowledge of use of DME or AE;Decreased knowledge of precautions ?  ?   ?OT Treatment/Interventions: Self-care/ADL training;Therapeutic exercise;Energy conservation;DME and/or AE instruction;Therapeutic activities;Patient/family education  ?  ?OT Goals(Current goals can be found in the care plan section) Acute Rehab OT Goals ?Patient Stated Goal: Be able to wash up ?OT Goal Formulation: With patient ?Time For Goal Achievement: 03/22/22 ?Potential to Achieve Goals: Good  ?OT Frequency: Min 2X/week ?  ? ?Co-evaluation   ?  ?  ?  ?  ? ?  ?AM-PAC OT "6 Clicks" Daily Activity     ?Outcome Measure Help from another person eating meals?: None ?Help from another person taking care of personal grooming?: None ?Help from another person toileting, which includes using toliet, bedpan, or urinal?: A Little ?Help from another person bathing (including washing, rinsing, drying)?: A Little ?Help from another person to put on and taking off regular upper body clothing?: None ?Help from another person to put on and taking off regular lower body clothing?: A Little ?6 Click Score: 21 ?  ?End of Session Equipment Utilized During Treatment: Rolling walker (2 wheels);Back brace ?Nurse Communication: Mobility status ? ?Activity Tolerance: Patient tolerated treatment  well ?Patient left: in chair;with call bell/phone within reach ? ?OT Visit Diagnosis: Unsteadiness on feet (R26.81);Other abnormalities of gait and mobility (R26.89);Muscle weakness (generalized) (M62.81)  ?              ?Time: [phone] ?OT Time Calculation (min): 31 min ?Charges:  OT General Charges ?$OT Visit: 1 Visit ?OT Evaluation ?$OT Eval Moderate Complexity: 1 Mod ?OT Treatments ?$Self Care/Home Management : 8-22 mins ? ?Matheau Orona MSOT, OTR/L ?Acute Rehab ?Pager: [phone] ?Office: [phone] ? ?Amandajo Gonder M Lillyan Hitson ?03/08/2022, 8:57 AM ?

## 2022-03-08 NOTE — Progress Notes (Signed)
Orthopedic Tech Progress Note ?Patient Details:  ?Victoria Sharp ?[birth year] ?[id number] ? ?Ortho Devices ?Type of Ortho Device: Lumbar corsett ?Ortho Device/Splint Location: BACK ?Ortho Device/Splint Interventions: Ordered ?  ?Post Interventions ?Patient Tolerated: Well ? ?Janit Pagan ?03/08/2022, 8:00 AM ? ?

## 2022-03-08 NOTE — Progress Notes (Signed)
Patient alert and oriented, mae's well, voiding adequate amount of urine, swallowing without difficulty, no c/o pain at time of discharge. Patient discharged home with family. Script and discharged instructions given to patient. Patient and family stated understanding of instructions given. Patient has an appointment with Dr. Ostergard in 2 weeks 

## 2022-03-08 NOTE — Care Management CC44 (Signed)
Condition Code 44 Documentation Completed ? ?Patient Details  ?Name: Victoria Sharp ?MRN: [id number] ?Date of Birth: 1941/01/04 ? ? ?Condition Code 44 given:  Yes ?Patient signature on Condition Code 44 notice:  Yes ?Documentation of 2 MD's agreement:  Yes ?Code 44 added to claim:  Yes ? ? ? ?Marilu Favre, RN ?03/08/2022, 2:27 PM ? ?

## 2022-03-08 NOTE — Progress Notes (Signed)
Neurosurgery Service ?Progress Note ? ?Subjective: No acute events overnight, radicular pain is resolved - she's very happy about that. Incisional pain as expected, lost a crown/bridge this morning while eating breakfast ? ?Objective: ?Vitals:  ? 03/07/22 1433 03/07/22 2012 03/07/22 2315 03/08/22 0505  ?BP: (!) 141/76 121/71 123/87 (!) 113/59  ?Pulse: 81 83 85 69  ?Resp: '18 18 18 18  '$ ?Temp: 98.1 ?F (36.7 ?C) 98.9 ?F (37.2 ?C) 97.9 ?F (36.6 ?C) 98.2 ?F (36.8 ?C)  ?TempSrc: Oral Oral Oral Oral  ?SpO2: 100% 95% 96% 96%  ?Weight:      ?Height:      ? ? ?Physical Exam: ?Strength 5/5 x4 and SILTx4  ? ?Assessment & Plan: ?81 y.o. 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 ? ?-up w/ PT/OT today, she's a bit hesitant about discharge home, will f/u their recs but suspect she'll do great ?-she'd like a brace, okay to place ?-likely discharge later today  ? ?Marcello Moores A Nils Thor  ?03/08/22 ?7:54 AM ? ?

## 2022-03-08 NOTE — Evaluation (Signed)
Physical Therapy Evaluation ?Patient Details ?Name: Victoria Sharp ?MRN: [id number] ?DOB: 11/10/1941 ?Today's Date: 03/08/2022 ? ?History of Present Illness ? 81 yo female s/p L4-5 lami on 3/13. PMH including cancer, HTN, and RA.  ?Clinical Impression ? Pt admitted with above diagnosis. At the time of PT eval, pt was able to demonstrate transfers and ambulation with gross supervision for safety and no AD. Pt was educated on precautions, brace application/wearing schedule, appropriate activity progression, and car transfer. Pt currently with functional limitations due to the deficits listed below (see PT Problem List). Pt will benefit from skilled PT to increase their independence and safety with mobility to allow discharge to the venue listed below.     ?   ? ?Recommendations for follow up therapy are one component of a multi-disciplinary discharge planning process, led by the attending physician.  Recommendations may be updated based on patient status, additional functional criteria and insurance authorization. ? ?Follow Up Recommendations Home health PT ? ?  ?Assistance Recommended at Discharge PRN  ?Patient can return home with the following ? A little help with walking and/or transfers;Assist for transportation;Help with stairs or ramp for entrance ? ?  ?Equipment Recommendations None recommended by PT  ?Recommendations for Other Services ?    ?  ?Functional Status Assessment Patient has had a recent decline in their functional status and demonstrates the ability to make significant improvements in function in a reasonable and predictable amount of time.  ? ?  ?Precautions / Restrictions Precautions ?Precautions: Back ?Precaution Booklet Issued: Yes (comment) ?Required Braces or Orthoses: Spinal Brace ?Spinal Brace: Lumbar corset (For comfort) ?Restrictions ?Weight Bearing Restrictions: No  ? ?  ? ?Mobility ? Bed Mobility ?Overal bed mobility: Needs Assistance ?Bed Mobility: Sit to Sidelying, Rolling ?Rolling:  Modified independent (Device/Increase time) ?  ?  ?  ?Sit to sidelying: Supervision ?General bed mobility comments: No assist required. HOB flat and rails lowered. Pt has an adjustable bed at home. ?  ? ?Transfers ?Overall transfer level: Needs assistance ?Equipment used: Rolling walker (2 wheels) ?Transfers: Sit to/from Stand ?Sit to Stand: Supervision ?  ?  ?  ?  ?  ?General transfer comment: Light supervision for safety. No assist required but increased time taken ?  ? ?Ambulation/Gait ?Ambulation/Gait assistance: Supervision ?Gait Distance (Feet): 350 Feet ?Assistive device: Rolling walker (2 wheels) ?Gait Pattern/deviations: Step-through pattern, Decreased stride length, Trunk flexed ?Gait velocity: Decreased ?Gait velocity interpretation: <1.31 ft/sec, indicative of household ambulator ?  ?General Gait Details: VC's for improved posture, closer walker proximity, and forward gaze. No assist required and no overt LOB noted. ? ?Stairs ?  ?  ?  ?  ?  ? ?Wheelchair Mobility ?  ? ?Modified Rankin (Stroke Patients Only) ?  ? ?  ? ?Balance Overall balance assessment: Mild deficits observed, not formally tested ?  ?  ?  ?  ?  ?  ?  ?  ?  ?  ?  ?  ?  ?  ?  ?  ?  ?  ?   ? ? ? ?Pertinent Vitals/Pain Pain Assessment ?Pain Assessment: Faces ?Faces Pain Scale: Hurts a little bit ?Pain Location: Back ?Pain Descriptors / Indicators: Discomfort ?Pain Intervention(s): Limited activity within patient's tolerance, Monitored during session, Repositioned  ? ? ?Home Living Family/patient expects to be discharged to:: Private residence ?Living Arrangements: Spouse/significant other ?Available Help at Discharge: Family;Available 24 hours/day (Husband 24/7 but he "isn't healthy" and son can check in - lives nearby but works) ?  Type of Home: House ?Home Access: Stairs to enter ?Entrance Stairs-Rails: Right;Left ?Entrance Stairs-Number of Steps: 1 ?  ?Home Layout: Two level;Able to live on main level with bedroom/bathroom ?Home Equipment:  Rollator (4 wheels);Grab bars - tub/shower;Grab bars - toilet;Shower seat - built in;Hand held shower head;Other (comment) (Adjustable bed) ?   ?  ?Prior Function Prior Level of Function : Independent/Modified Independent;Driving ?  ?  ?  ?  ?  ?  ?  ?  ?  ? ? ?Hand Dominance  ?   ? ?  ?Extremity/Trunk Assessment  ? Upper Extremity Assessment ?Upper Extremity Assessment: Defer to OT evaluation ?  ? ?Lower Extremity Assessment ?Lower Extremity Assessment: Generalized weakness (Mild; consistent with pre-op diagnosis) ?  ? ?Cervical / Trunk Assessment ?Cervical / Trunk Assessment: Back Surgery  ?Communication  ? Communication: No difficulties  ?Cognition Arousal/Alertness: Awake/alert ?Behavior During Therapy: [facility] for tasks assessed/performed ?Overall Cognitive Status: Within Functional Limits for tasks assessed ?  ?  ?  ?  ?  ?  ?  ?  ?  ?  ?  ?  ?  ?  ?  ?  ?  ?  ?  ? ?  ?General Comments   ? ?  ?Exercises    ? ?Assessment/Plan  ?  ?PT Assessment Patient needs continued PT services  ?PT Problem List Decreased strength;Decreased activity tolerance;Decreased balance;Decreased mobility;Decreased knowledge of use of DME;Decreased safety awareness;Decreased knowledge of precautions;Pain ? ?   ?  ?PT Treatment Interventions DME instruction;Gait training;Functional mobility training;Therapeutic activities;Therapeutic exercise;Neuromuscular re-education;Patient/family education   ? ?PT Goals (Current goals can be found in the Care Plan section)  ?Acute Rehab PT Goals ?Patient Stated Goal: Home at d/c, be able to assist husband as able ?PT Goal Formulation: With patient ?Time For Goal Achievement: 03/15/22 ?Potential to Achieve Goals: Good ? ?  ?Frequency Min 5X/week ?  ? ? ?Co-evaluation   ?  ?  ?  ?  ? ? ?  ?AM-PAC PT "6 Clicks" Mobility  ?Outcome Measure Help needed turning from your back to your side while in a flat bed without using bedrails?: None ?Help needed moving from lying on your back to sitting on the side of a  flat bed without using bedrails?: A Little ?Help needed moving to and from a bed to a chair (including a wheelchair)?: A Little ?Help needed standing up from a chair using your arms (e.g., wheelchair or bedside chair)?: A Little ?Help needed to walk in hospital room?: A Little ?Help needed climbing 3-5 steps with a railing? : A Little ?6 Click Score: 19 ? ?  ?End of Session Equipment Utilized During Treatment: Back brace;Gait belt ?Activity Tolerance: Patient tolerated treatment well ?Patient left: in bed;with call bell/phone within reach ?Nurse Communication: Mobility status ?PT Visit Diagnosis: Unsteadiness on feet (R26.81);Pain ?Pain - part of body:  (back) ?  ? ?Time: 4163-8453 ?PT Time Calculation (min) (ACUTE ONLY): 31 min ? ? ?Charges:   PT Evaluation ?$PT Eval Low Complexity: 1 Low ?PT Treatments ?$Gait Training: 8-22 mins ?  ?   ? ? ?Rolinda Roan, PT, DPT ?Acute Rehabilitation Services ?Pager: [phone] ?Office: [phone]  ? ?Thelma Comp ?03/08/2022, 9:48 AM ? ?

## 2022-03-09 ENCOUNTER — Encounter (HOSPITAL_COMMUNITY): Payer: Self-pay | Admitting: Neurological Surgery

## 2022-03-09 NOTE — Anesthesia Postprocedure Evaluation (Signed)
Anesthesia Post Note ? ?Patient: Victoria Sharp ? ?Procedure(s) Performed: LUMBAR FOUR- FIVE,  MINIMALLY  INVASIVE TRANSFORAMINAL LUMBAR  INTERBODY FUSION  w/ METRIX ? ?  ? ?Patient location during evaluation: PACU ?Anesthesia Type: General ?Level of consciousness: awake and alert ?Pain management: pain level controlled ?Vital Signs Assessment: post-procedure vital signs reviewed and stable ?Respiratory status: spontaneous breathing, nonlabored ventilation, respiratory function stable and patient connected to nasal cannula oxygen ?Cardiovascular status: blood pressure returned to baseline and stable ?Postop Assessment: no apparent nausea or vomiting ?Anesthetic complications: no ? ? ?No notable events documented. ? ?Last Vitals:  ?Vitals:  ? 03/08/22 0837 03/08/22 1159  ?BP: 108/67 106/69  ?Pulse: 89 92  ?Resp: 16 16  ?Temp: 36.7 ?C 37.2 ?C  ?SpO2: 100% 98%  ?  ?Last Pain:  ?Vitals:  ? 03/08/22 1529  ?TempSrc:   ?PainSc: 3   ? ? ?  ?  ?  ?  ?  ?  ? ?Victoria Sharp ? ? ? ? ?

## 2022-03-21 ENCOUNTER — Ambulatory Visit: Payer: Medicare Other | Admitting: Physician Assistant

## 2022-04-04 NOTE — Progress Notes (Signed)
? ?Office Visit Note ? ?Patient: Victoria Sharp             ?Date of Birth: [birth year]           ?MRN: [id number]             ?PCP: [name]., MD ?Referring: [name].,* ?Visit Date: 04/18/2022 ?Occupation: '@GUAROCC'$ @ ? ?Subjective:  ?Medication monitoring  ? ?History of Present Illness: Victoria Sharp is a 81 y.o. female with history of seropositive rheumatoid arthritis, DDD, positive ANA, and osteoarthritis.  She denies any signs or symptoms of a rheumatoid arthritis flare.  She underwent a lumbar fusion on 03/07/2022 performed by Dr. Zada Finders.  Patient was off of Enbrel for about 3 weeks around the time of surgery.  She did not have any signs of infection or complications after the spinal fusion.  She has been working with PT and OT and states that her lower back pain has improved significantly.  She has been able to increase her activity level as tolerated. ?She denies any signs or symptoms of a rheumatoid arthritis flare while off of Enbrel.  She has been injecting Enbrel every 14 days and has not noticed any new or worsening symptoms while spacing the dose.  She denies any signs or symptoms of an infection.  She continues to have yearly skin examinations while on Enbrel.  She denies any new medical conditions. ? ? ?Activities of Daily Living:  ?Patient reports morning stiffness for a few  hours.   ?Patient Denies nocturnal pain.  ?Difficulty dressing/grooming: Denies ?Difficulty climbing stairs: Denies ?Difficulty getting out of chair: Denies ?Difficulty using hands for taps, buttons, cutlery, and/or writing: Denies ? ?Review of Systems  ?Constitutional:  Negative for fatigue.  ?HENT:  Negative for mouth sores, mouth dryness and nose dryness.   ?Eyes:  Negative for pain, itching and dryness.  ?Respiratory:  Negative for shortness of breath and difficulty breathing.   ?Cardiovascular:  Negative for chest pain and palpitations.  ?Gastrointestinal:  Negative for blood in stool,  constipation and diarrhea.  ?Endocrine: Negative for increased urination.  ?Genitourinary:  Negative for difficulty urinating.  ?Musculoskeletal:  Positive for myalgias, morning stiffness, muscle tenderness and myalgias. Negative for joint pain, joint pain and joint swelling.  ?Skin:  Negative for color change, rash and redness.  ?Allergic/Immunologic: Positive for susceptible to infections.  ?Neurological:  Positive for numbness. Negative for dizziness, memory loss and weakness.  ?Hematological:  Negative for bruising/bleeding tendency.  ?Psychiatric/Behavioral:  Negative for confusion.   ? ?PMFS History:  ?Patient Active Problem List  ? Diagnosis Date Noted  ? Spondylolisthesis of lumbar region 03/07/2022  ? TIA (transient ischemic attack) 01/27/2021  ? Rheumatoid arthritis with rheumatoid factor of multiple sites without organ or systems involvement ([place]) 06/26/2020  ? Essential hypertension 06/26/2020  ? MVP (mitral valve prolapse) 06/26/2020  ? History of Barrett's esophagus 06/26/2020  ? History of gastroesophageal reflux (GERD) 06/26/2020  ? History of breast cancer 06/26/2020  ? High risk medication use 06/26/2020  ?  ?Past Medical History:  ?Diagnosis Date  ? Cancer [facility])   ? Dermatitis   ? History of breast cancer   ? Hypertension   ? Migraines   ? PONV (postoperative nausea and vomiting)   ? Rheumatoid arthritis ([place])   ? Tachycardia   ? TIA (transient ischemic attack) 01/2020  ? questionable TIA vs migraine per neurologist  ?  ?Family History  ?Problem Relation Age of Onset  ? Heart attack Father   ?  Kidney disease Brother   ? Cancer Brother   ? ?Past Surgical History:  ?Procedure Laterality Date  ? ABDOMINAL HYSTERECTOMY    ? ABLATION  08/2021  ? Dr. Ernestina Patches  ? BREAST LUMPECTOMY Left 1998  ? NEUROMA SURGERY Right 03/2020  ? TRANSFORAMINAL LUMBAR INTERBODY FUSION W/ MIS 1 LEVEL N/A 03/07/2022  ? Procedure: LUMBAR FOUR- FIVE,  MINIMALLY  INVASIVE TRANSFORAMINAL LUMBAR  INTERBODY FUSION  w/ METRIX;  Surgeon:  Judith Part, MD;  Location: [place];  Service: Neurosurgery;  Laterality: N/A;  3C/RM 21  ? WEDGE RESECTION    ? ?Social History  ? ?Social History Narrative  ? Lives with husband in home  ? Right Handed  ? Drinks coffee for caffeine  ? ?Immunization History  ?Administered Date(s) Administered  ? PFIZER(Purple Top)SARS-COV-2 Vaccination 01/09/2020, 01/30/2020, 09/11/2020, 10/18/2021  ?  ? ?Objective: ?Vital Signs: BP (!) 143/86 (BP Location: Left Arm, Patient Position: Sitting, Cuff Size: Normal)   Pulse 64   Ht '5\' 2"'$  (1.575 m)   Wt 150 lb (68 kg)   BMI 27.44 kg/m?   ? ?Physical Exam ?Vitals and nursing note reviewed.  ?Constitutional:   ?   Appearance: She is well-developed.  ?HENT:  ?   Head: Normocephalic and atraumatic.  ?Eyes:  ?   Conjunctiva/sclera: Conjunctivae normal.  ?Cardiovascular:  ?   Rate and Rhythm: Normal rate and regular rhythm.  ?   Heart sounds: Normal heart sounds.  ?Pulmonary:  ?   Effort: Pulmonary effort is normal.  ?   Breath sounds: Normal breath sounds.  ?Abdominal:  ?   General: Bowel sounds are normal.  ?   Palpations: Abdomen is soft.  ?Musculoskeletal:  ?   Cervical back: Normal range of motion.  ?Skin: ?   General: Skin is warm and dry.  ?   Capillary Refill: Capillary refill takes less than 2 seconds.  ?Neurological:  ?   Mental Status: She is alert and oriented to person, place, and time.  ?Psychiatric:     ?   Behavior: Behavior normal.  ?  ? ?Musculoskeletal Exam: C-spine has slightly limited range of motion with lateral rotation.  Good flexion extension of the C-spine.  Shoulder joints, elbow joints, wrist joints, MCPs, PIPs, DIPs have good range of motion with no synovitis.  Complete fist formation bilaterally.  Hip joints have good range of motion with no groin pain.  Knee joints have good range of motion with no warmth or effusion.  Ankle joints have good range of motion with no tenderness or joint swelling.  Bunions over both great toes noted.  No tenderness over  MTP joints.  No evidence of Achilles tendinitis or plantar fasciitis. ? ?CDAI Exam: ?CDAI Score: 0.2  ?Patient Global: 1 mm; Provider Global: 1 mm ?Swollen: 0 ; Tender: 0  ?Joint Exam 04/18/2022  ? ?No joint exam has been documented for this visit  ? ?There is currently no information documented on the homunculus. Go to the Rheumatology activity and complete the homunculus joint exam. ? ?Investigation: ?No additional findings. ? ?Imaging: ?No results found. ? ?Recent Labs: ?Lab Results  ?Component Value Date  ? WBC 4.4 10/21/2021  ? HGB 14.3 10/21/2021  ? PLT 221 10/21/2021  ? NA 139 10/21/2021  ? K 3.8 10/21/2021  ? CL 104 10/21/2021  ? CO2 27 10/21/2021  ? GLUCOSE 91 10/21/2021  ? BUN 21 10/21/2021  ? CREATININE 1.05 (H) 10/21/2021  ? BILITOT 0.6 10/21/2021  ? ALKPHOS 65 01/27/2021  ?  AST 25 10/21/2021  ? ALT 15 10/21/2021  ? PROT 6.4 10/21/2021  ? ALBUMIN 4.3 01/27/2021  ? CALCIUM 9.3 10/21/2021  ? GFRAA 62 06/09/2021  ? QFTBGOLDPLUS NEGATIVE 06/09/2021  ? ? ?Speciality Comments: Methotrexate-diarrhea, Arava-dermatitis ?Enbrel 08/2020 ? ?Procedures:  ?No procedures performed ?Allergies: Contrast media [iodinated contrast media], Latex, Leflunomide, Methotrexate derivatives, Metronidazole, and Sulfamethoxazole-trimethoprim  ? ?Assessment / Plan:     ?Visit Diagnoses: Rheumatoid arthritis with rheumatoid factor of multiple sites without organ or systems involvement (HCC) - Positive RF, +anti-CCP, +ANA, +synovitis: She has no joint tenderness or synovitis on examination today.  She has not had any signs or symptoms of a rheumatoid arthritis flare.  She is clinically doing well on Enbrel 50 mg sq injections every 14 days.  She has been able to space the dose of Enbrel without any new or worsening symptoms.  She continues to have morning stiffness which improves with activity.  She has not had any nocturnal pain.  No difficulty with ADLs.  She will remain on Enbrel 50 mg sq injections every 14 days.  She was advised  to notify us if she develops increased joint pain or joint swelling.  She will follow-up in the office in 5 months or sooner if needed. ? ?High risk medication use - Enbrel 50 mg sq injections q 14 days since

## 2022-04-14 ENCOUNTER — Encounter: Payer: Self-pay | Admitting: Adult Health

## 2022-04-14 ENCOUNTER — Ambulatory Visit (INDEPENDENT_AMBULATORY_CARE_PROVIDER_SITE_OTHER): Payer: Medicare Other | Admitting: Adult Health

## 2022-04-14 VITALS — BP 136/77 | HR 67 | Ht 62.0 in | Wt 148.0 lb

## 2022-04-14 DIAGNOSIS — G43109 Migraine with aura, not intractable, without status migrainosus: Secondary | ICD-10-CM

## 2022-04-14 NOTE — Progress Notes (Signed)
?[facility] ?[address] ?[place]. [place] [address] ?[phone] ? ?     OFFICE FOLLOW UP NOTE ? ?[name] ?Date of Birth:  06-26-1941 ?Medical Record Number:  [id number]  ? ?Reason for Referral:  Hospital follow up ? ? ?Chief Complaint  ?Patient presents with  ? Follow-up  ?  Rm 3 alone ?Pt is well and stable, no new concerns   ?  ? ? ?SUBJECTIVE: ? ?Initial visit 02/25/2021 :( Frann Rider, NP ) [name] is a 81 y.o. female with hx of RA and HTN who presented on 01/27/2021 with an episode of double vision with floaters in her eyes, followed by circumoral numbness, left hand numbness and tingling along with trouble working her TV remote. The episode lasted about an hour and resolved prior to coming to the ED. Endorses low grade headache at the vertex for the last month, no prior history of migraine documented but reports history of headaches for several years nonetheless. Personally reviewed hospitalization pertinent progress notes, lab work and imaging with summary provided. Evaluated by Dr. Leonie Man with stroke work-up largely unremarkable and likely TIA vs complicated migrainous type headache episode and less likely inflammatory process such as GCA.  CTA head/neck unremarkable.  MRI negative.  Initiated topiramate 25 mg twice daily for headache prophylaxis.  Due to possible TIA, initiated aspirin 81 mg daily.  Resumed home dose Cozaar for HTN management.  LDL 112.  A1c 5.3.  Other stroke risk factors include advanced age and EtOH use but no prior stroke history.  She was discharged home in stable condition without therapy needs. ? ?Since discharge, she continues to experience migraines frequently but severity greatly decreased.  She has not had any additional auras or neurological symptoms.  Typically located right frontal temporal but occasionally tightness sensation behind her eyes.  She does endorse that she remembers her mother frequently complaining of headaches  but unsure if she was diagnosed with migraines. ? ?She has remained on topiramate 25 mg twice daily -reports initially felt dizzy but subsided after couple days.  She also reports altered taste and decreased appetite since initiating ? ?She has remained on aspirin 81 mg daily for stroke prevention -denies bleeding or bruising ?Blood pressure today 158/86 -routinely monitor at home which has been stable ? ?No further concerns at this time ? ? ?Update 06/10/2021 Dr. Leonie Man: Patient states she is doing well.  She has brought her headache diary along and she has shown a good response to Topamax.  Initially she was having 1-2 headache episodes a month but for the last 6 weeks she has had no headaches.  She has had no further episodes of visual disturbance either.  She is on Topamax 50 mg twice daily and does complain of some alteration of taste as well as some occasional tingling of her extremities which is nonbothersome.  Her primary care physician has increased her blood pressure medications and blood pressure seems also quite well controlled.  She complains of feeling sleepy despite sleeping well at night.  She has an appointment to see a rheumatologist next week and plans to discuss this.  She has no new complaints. ? ?Update 10/12/2021 JM: Returns for 28-monthfollow-up unaccompanied.  Overall doing well.  She had experienced 3 headaches since prior visit and discontinuing topiramate but these have been mild and she feels more due to weather changes. Reports improvement of taste since discontinued topiramate.  Denies new or reoccurring stroke/TIA symptoms or neurological symptoms.  She does report occasional short lasting vertigo sensation when laying in bed and turns head quickly.  Does not experience any type of vertigo with position changes or head movements while standing.  Compliant on aspirin without side effects.  Blood pressure today 152/84 - routinely monitors at home and typically stable.  ? ?Follows with  ortho for low back issues with recent MRI completed and follow-up with Dr. Louanne Skye tomorrow to further discuss.  Did have SI joint ablation approx 6 weeks ago with Dr. Ernestina Patches with some improvement but continues to experience RLE radiculopathy.  ? ?Continues to be followed by Dr. Estanislado Pandy rheumatology for RA ? ?No further concerns at this time ? ? ?Update 04/14/2022 JM: Patient returns for 31-monthfollow-up.  She has been doing very well since prior visit.  She has not had any additional migraine headaches or even mild headache since prior visit. No current prophylactic therapy.  She did undergo L4-5 MIS TLIF by Dr. OVenetia Constableon 3/13 for lumbar spondylolisthesis and lumbar radiculopathy without complication.  Reports she has recovered from this very well and does not have any residual pain.  She is scheduled to follow-up with Dr. OVenetia Constablenext month.  Denies any stroke/TIA symptoms or any other neurological symptoms.  No new concerns at this time. ? ? ? ? ?ROS:   ?14 system review of systems performed and negative with exception of those listed in HPI ? ?PMH:  ?Past Medical History:  ?Diagnosis Date  ? Cancer [facility]   ? Dermatitis   ? History of breast cancer   ? Hypertension   ? Migraines   ? PONV (postoperative nausea and vomiting)   ? Rheumatoid arthritis ([place]   ? Tachycardia   ? TIA (transient ischemic attack) 01/2020  ? questionable TIA vs migraine per neurologist  ? ? ?PSH:  ?Past Surgical History:  ?Procedure Laterality Date  ? ABDOMINAL HYSTERECTOMY    ? ABLATION  08/2021  ? Dr. NErnestina Patches ? BREAST LUMPECTOMY Left 1998  ? NEUROMA SURGERY Right 03/2020  ? TRANSFORAMINAL LUMBAR INTERBODY FUSION W/ MIS 1 LEVEL N/A 03/07/2022  ? Procedure: LUMBAR FOUR- FIVE,  MINIMALLY  INVASIVE TRANSFORAMINAL LUMBAR  INTERBODY FUSION  w/ METRIX;  Surgeon: OJudith Part MD;  [place]  Service: Neurosurgery;  Laterality: N/A;  3C/RM 21  ? WEDGE RESECTION    ? ? ?Social History:  ?Social History  ? ?Socioeconomic  History  ? Marital status: Married  ?  Spouse name: Not on file  ? Number of children: Not on file  ? Years of education: Not on file  ? Highest education level: Not on file  ?Occupational History  ? Not on file  ?Tobacco Use  ? Smoking status: Never  ? Smokeless tobacco: Never  ?Vaping Use  ? Vaping Use: Never used  ?Substance and Sexual Activity  ? Alcohol use: Yes  ?  Comment: rarely  ? Drug use: Never  ? Sexual activity: Not on file  ?Other Topics Concern  ? Not on file  ?Social History Narrative  ? Lives with husband in home  ? Right Handed  ? Drinks coffee for caffeine  ? ?Social Determinants of Health  ? ?Financial Resource Strain: Not on file  ?Food Insecurity: Not on file  ?Transportation Needs: Not on file  ?Physical Activity: Not on file  ?Stress: Not on file  ?Social Connections: Not on file  ?Intimate Partner Violence: Not on file  ? ? ?Family History:  ?Family History  ?Problem Relation  Age of Onset  ? Heart attack Father   ? Kidney disease Brother   ? Cancer Brother   ? ? ?Medications:   ?Current Outpatient Medications on File Prior to Visit  ?Medication Sig Dispense Refill  ? amLODipine (NORVASC) 2.5 MG tablet Take 5 mg by mouth daily.    ? aspirin 81 MG chewable tablet Chew 1 tablet (81 mg total) by mouth daily. 30 tablet 12  ? cholecalciferol (VITAMIN D3) 25 MCG (1000 UNIT) tablet Take 1,000 Units by mouth daily.    ? Etanercept (ENBREL MINI) 50 MG/ML SOCT Inject '50mg'$  into the skin every 10 days. (Patient taking differently: Inject '50mg'$  into the skin every 14 days.) 12 mL 0  ? losartan (COZAAR) 100 MG tablet Take 100 mg by mouth daily.    ? pantoprazole (PROTONIX) 20 MG tablet Take 20 mg by mouth daily.    ? Polyethyl Glycol-Propyl Glycol (SYSTANE) 0.4-0.3 % SOLN Place 1 drop into both eyes 2 (two) times a week.    ? ?No current facility-administered medications on file prior to visit.  ? ? ?Allergies:   ?Allergies  ?Allergen Reactions  ? Contrast Media [Iodinated Contrast Media] Hives  ? Latex  Other (See Comments)  ?  Ask pt and enter  ? Leflunomide   ? Methotrexate Derivatives   ? Metronidazole Nausea And Vomiting and Other (See Comments)  ?  Ask pt and enter ?  ? Sulfamethoxazole-Trimethoprim Nausea An

## 2022-04-14 NOTE — Patient Instructions (Signed)
No changes today ? ?Can follow-up as needed at this time ? ? ? ? ?Thank you for coming to see [place] at [facility]. I hope we have been able to provide you high quality care today. ? ?You may receive a patient satisfaction survey over the next few weeks. We would appreciate your feedback and comments so that we may continue to improve ourselves and the health of our patients. ? ?

## 2022-04-18 ENCOUNTER — Encounter: Payer: Self-pay | Admitting: Physician Assistant

## 2022-04-18 ENCOUNTER — Ambulatory Visit (INDEPENDENT_AMBULATORY_CARE_PROVIDER_SITE_OTHER): Payer: Medicare Other | Admitting: Physician Assistant

## 2022-04-18 VITALS — BP 143/86 | HR 64 | Ht 62.0 in | Wt 150.0 lb

## 2022-04-18 DIAGNOSIS — Z8719 Personal history of other diseases of the digestive system: Secondary | ICD-10-CM

## 2022-04-18 DIAGNOSIS — M19041 Primary osteoarthritis, right hand: Secondary | ICD-10-CM | POA: Diagnosis not present

## 2022-04-18 DIAGNOSIS — M8589 Other specified disorders of bone density and structure, multiple sites: Secondary | ICD-10-CM

## 2022-04-18 DIAGNOSIS — M0579 Rheumatoid arthritis with rheumatoid factor of multiple sites without organ or systems involvement: Secondary | ICD-10-CM | POA: Diagnosis not present

## 2022-04-18 DIAGNOSIS — I341 Nonrheumatic mitral (valve) prolapse: Secondary | ICD-10-CM

## 2022-04-18 DIAGNOSIS — I10 Essential (primary) hypertension: Secondary | ICD-10-CM

## 2022-04-18 DIAGNOSIS — M5136 Other intervertebral disc degeneration, lumbar region: Secondary | ICD-10-CM

## 2022-04-18 DIAGNOSIS — R768 Other specified abnormal immunological findings in serum: Secondary | ICD-10-CM

## 2022-04-18 DIAGNOSIS — Z79899 Other long term (current) drug therapy: Secondary | ICD-10-CM

## 2022-04-18 DIAGNOSIS — M19042 Primary osteoarthritis, left hand: Secondary | ICD-10-CM

## 2022-04-18 DIAGNOSIS — Z853 Personal history of malignant neoplasm of breast: Secondary | ICD-10-CM

## 2022-04-18 DIAGNOSIS — E559 Vitamin D deficiency, unspecified: Secondary | ICD-10-CM

## 2022-04-18 DIAGNOSIS — Z111 Encounter for screening for respiratory tuberculosis: Secondary | ICD-10-CM

## 2022-04-18 DIAGNOSIS — M19072 Primary osteoarthritis, left ankle and foot: Secondary | ICD-10-CM

## 2022-04-18 DIAGNOSIS — M19071 Primary osteoarthritis, right ankle and foot: Secondary | ICD-10-CM

## 2022-04-18 DIAGNOSIS — M4156 Other secondary scoliosis, lumbar region: Secondary | ICD-10-CM

## 2022-04-18 NOTE — Patient Instructions (Signed)
Standing Labs °We placed an order today for your standing lab work.  ° °Please have your standing labs drawn in May and every 3 months  ° ° °If possible, please have your labs drawn 2 weeks prior to your appointment so that the provider can discuss your results at your appointment. ° °Please note that you may see your imaging and lab results in MyChart before we have reviewed them. °We may be awaiting multiple results to interpret others before contacting you. °Please allow our office up to 72 hours to thoroughly review all of the results before contacting the office for clarification of your results. ° °We have open lab daily: °Monday through Thursday from 1:30-4:30 PM and Friday from 1:30-4:00 PM °at the office of Dr. Shaili Deveshwar, [facility].   °Please be advised, all patients with office appointments requiring lab work will take precedent over walk-in lab work.  °If possible, please come for your lab work on Monday and Friday afternoons, as you may experience shorter wait times. °The office is located at [address], [address], [place], [place] [address] °No appointment is necessary.   °Labs are drawn by [facility]. Please bring your co-pay at the time of your lab draw.  You may receive a bill from [facility] for your lab work. ° °Please note if you are on Hydroxychloroquine and and an order has been placed for a Hydroxychloroquine level, you will need to have it drawn 4 hours or more after your last dose. ° °If you wish to have your labs drawn at another location, please call the office 24 hours in advance to send orders. ° °If you have any questions regarding directions or hours of operation,  °please call [phone].   °As a reminder, please drink plenty of water prior to coming for your lab work. Thanks! ° °

## 2022-05-26 ENCOUNTER — Other Ambulatory Visit: Payer: Self-pay | Admitting: Rheumatology

## 2022-05-26 DIAGNOSIS — M0579 Rheumatoid arthritis with rheumatoid factor of multiple sites without organ or systems involvement: Secondary | ICD-10-CM

## 2022-05-26 DIAGNOSIS — Z111 Encounter for screening for respiratory tuberculosis: Secondary | ICD-10-CM

## 2022-05-26 DIAGNOSIS — Z79899 Other long term (current) drug therapy: Secondary | ICD-10-CM

## 2022-05-26 MED ORDER — ENBREL MINI 50 MG/ML ~~LOC~~ SOCT: SUBCUTANEOUS | 0 refills | Status: DC

## 2022-05-26 NOTE — Telephone Encounter (Signed)
Next Visit: 09/27/2022  Last Visit: 04/18/2022  Last Fill: 10/25/2021  PE:[id number] arthritis with rheumatoid factor of multiple sites without organ or systems involvement   Current Dose per office note 04/18/2022: Enbrel 50 mg sq injections q 14 days since 08/2021  Labs: 02/15/2022 CO2 31, GFR 57  TB Gold:  06/09/2021 Neg   Patient advised she is due to update labs. Patient will try to go this week or next to update.   Okay to refill Enbrel?

## 2022-05-26 NOTE — Telephone Encounter (Signed)
Patient called the office stating she called to set up shipment for Enbrel. Patient states they told her a new prescription was needed. Patient does not know what pharmacy delivers the medication. Patient wants call when it has been sent.

## 2022-06-03 ENCOUNTER — Telehealth: Payer: Self-pay | Admitting: Rheumatology

## 2022-06-03 DIAGNOSIS — Z9225 Personal history of immunosupression therapy: Secondary | ICD-10-CM

## 2022-06-03 DIAGNOSIS — Z111 Encounter for screening for respiratory tuberculosis: Secondary | ICD-10-CM

## 2022-06-03 NOTE — Telephone Encounter (Signed)
Patient called the office requesting lab orders be sent [facility]. Patient plans on going Monday 06/06/22.

## 2022-06-06 ENCOUNTER — Other Ambulatory Visit: Payer: Self-pay | Admitting: *Deleted

## 2022-06-06 DIAGNOSIS — Z111 Encounter for screening for respiratory tuberculosis: Secondary | ICD-10-CM

## 2022-06-06 DIAGNOSIS — Z9225 Personal history of immunosupression therapy: Secondary | ICD-10-CM

## 2022-06-06 DIAGNOSIS — Z79899 Other long term (current) drug therapy: Secondary | ICD-10-CM

## 2022-06-06 NOTE — Telephone Encounter (Signed)
Lab Orders released.  

## 2022-06-07 NOTE — Progress Notes (Signed)
CBC and CMP WNL

## 2022-06-11 LAB — CBC WITH DIFFERENTIAL/PLATELET
Absolute Monocytes: 567 cells/uL (ref 200–950)
Basophils Absolute: 61 cells/uL (ref 0–200)
Basophils Relative: 1.1 %
Eosinophils Absolute: 281 cells/uL (ref 15–500)
Eosinophils Relative: 5.1 %
HCT: 43 % (ref 35.0–45.0)
Hemoglobin: 14.1 g/dL (ref 11.7–15.5)
Lymphs Abs: 1958 cells/uL (ref 850–3900)
MCH: 30.2 pg (ref 27.0–33.0)
MCHC: 32.8 g/dL (ref 32.0–36.0)
MCV: 92.1 fL (ref 80.0–100.0)
MPV: 12 fL (ref 7.5–12.5)
Monocytes Relative: 10.3 %
Neutro Abs: 2635 cells/uL (ref 1500–7800)
Neutrophils Relative %: 47.9 %
Platelets: 270 10*3/uL (ref 140–400)
RBC: 4.67 10*6/uL (ref 3.80–5.10)
RDW: 12.2 % (ref 11.0–15.0)
Total Lymphocyte: 35.6 %
WBC: 5.5 10*3/uL (ref 3.8–10.8)

## 2022-06-11 LAB — QUANTIFERON-TB GOLD PLUS
Mitogen-NIL: 6.86 IU/mL
NIL: 0.08 IU/mL
QuantiFERON-TB Gold Plus: NEGATIVE
TB1-NIL: 0.07 IU/mL
TB2-NIL: 0.11 IU/mL

## 2022-06-11 LAB — COMPLETE METABOLIC PANEL WITH GFR
AG Ratio: 1.8 (calc) (ref 1.0–2.5)
ALT: 9 U/L (ref 6–29)
AST: 16 U/L (ref 10–35)
Albumin: 4.2 g/dL (ref 3.6–5.1)
Alkaline phosphatase (APISO): 81 U/L (ref 37–153)
BUN: 23 mg/dL (ref 7–25)
CO2: 26 mmol/L (ref 20–32)
Calcium: 9.7 mg/dL (ref 8.6–10.4)
Chloride: 105 mmol/L (ref 98–110)
Creat: 0.91 mg/dL (ref 0.60–1.00)
Globulin: 2.4 g/dL (calc) (ref 1.9–3.7)
Glucose, Bld: 89 mg/dL (ref 65–99)
Potassium: 4.9 mmol/L (ref 3.5–5.3)
Sodium: 141 mmol/L (ref 135–146)
Total Bilirubin: 0.3 mg/dL (ref 0.2–1.2)
Total Protein: 6.6 g/dL (ref 6.1–8.1)
eGFR: 64 mL/min/{1.73_m2} (ref >=60)

## 2022-06-13 NOTE — Progress Notes (Signed)
TB gold negative

## 2022-06-21 NOTE — Therapy (Signed)
OUTPATIENT PHYSICAL THERAPY THORACOLUMBAR EVALUATION   Patient Name: Victoria Sharp MRN: [id number] DOB:1941/01/15, 81 y.o., female Today's Date: 07/05/2022   PT End of Session - 07/05/22 0848     Visit Number 1    Number of Visits 13    Date for PT Re-Evaluation 08/19/22    Authorization Type Medicare & [employer] BC[employer]    PT Start Time 0848    PT Stop Time [phone]    PT Time Calculation (min) 60 min    Activity Tolerance Patient tolerated treatment well    Behavior During Therapy [facility] for tasks assessed/performed             Past Medical History:  Diagnosis Date   Cancer ([place])    Dermatitis    History of breast cancer    Hypertension    Migraines    PONV (postoperative nausea and vomiting)    Rheumatoid arthritis ([place])    Tachycardia    TIA (transient ischemic attack) 01/2020   questionable TIA vs migraine per neurologist   Past Surgical History:  Procedure Laterality Date   ABDOMINAL HYSTERECTOMY     ABLATION  08/2021   Dr. Ernestina Patches   BREAST LUMPECTOMY Left 1998   NEUROMA SURGERY Right 03/2020   TRANSFORAMINAL LUMBAR INTERBODY FUSION W/ MIS 1 LEVEL N/A 03/07/2022   Procedure: LUMBAR FOUR- FIVE,  MINIMALLY  INVASIVE TRANSFORAMINAL LUMBAR  INTERBODY FUSION  w/ METRIX;  Surgeon: Judith Part, MD;  Location: [place];  Service: Neurosurgery;  Laterality: N/A;  3C/RM 21   WEDGE RESECTION     Patient Active Problem List   Diagnosis Date Noted   Spondylolisthesis of lumbar region 03/07/2022   TIA (transient ischemic attack) 01/27/2021   Rheumatoid arthritis with rheumatoid factor of multiple sites without organ or systems involvement ([place]) 06/26/2020   Essential hypertension 06/26/2020   MVP (mitral valve prolapse) 06/26/2020   History of Barrett's esophagus 06/26/2020   History of gastroesophageal reflux (GERD) 06/26/2020   History of breast cancer 06/26/2020   High risk medication use 06/26/2020    PCP: [name]., MD  REFERRING PROVIDER:  Judith Part, MD  REFERRING DIAG: M43.16 (ICD-10-CM) - Spondylolisthesis, lumbar region  RATIONALE FOR EVALUATION AND TREATMENT: Rehabilitation  THERAPY DIAG:  Muscle weakness (generalized)  Other symptoms and signs involving the musculoskeletal system  Muscle spasm of back  ONSET DATE: 03/07/22 - L4-5 Minimally invasive transforaminal lumbar interbody fusion w/ Metrix  SUBJECTIVE:  SUBJECTIVE STATEMENT: Pt reports she had her back surgery on 03/07/22. Walks 4-5x/wk for at least 1 mile. She wants to get back into exercising but want to make sure she does hurt her back. She has kept up with the [facility] PT exercises  PAIN:  Are you having pain? No  PERTINENT HISTORY:  L4-5 Minimally invasive transforaminal lumbar interbody fusion w/ Metrix - 03/07/22; HTN; TIA; migraines; breast cancer; RA; tachycardia; MVP; GERD  PRECAUTIONS: None  WEIGHT BEARING RESTRICTIONS No  FALLS:  Has patient fallen in last 6 months? No  LIVING ENVIRONMENT: Lives with: lives with their spouse Lives in: House/apartment Stairs: Yes: Internal: 14 steps; on left going up - to bonus room and bath (does not go up regularly) Has following equipment at home:  3-wheel rollator  OCCUPATION: Retired  PLOF: [place]: "To get back into exercising safely for my back."   OBJECTIVE:   DIAGNOSTIC FINDINGS:  10/09/21 - Lumbar MRI: 1. At L4-L5, moderate canal stenosis with bilateral subarticular recess narrowing and moderate right foraminal stenosis. Grade 1 anterolisthesis at this level.  2. At L3-L4, mild canal and right foraminal stenosis.  PATIENT SURVEYS:  FOTO Lumbar = 55; predicted = 62  SCREENING FOR RED FLAGS: Bowel or bladder incontinence: No Spinal tumors: No Cauda equina syndrome: No Compression  fracture: No Abdominal aneurysm: No  COGNITION:  Overall cognitive status: Within functional limits for tasks assessed     SENSATION: WFL  MUSCLE LENGTH: Hamstrings: mod tight B ITB: mod tight B Piriformis: mild/mod tight B Hip flexors: mod tight B Quads: mild/mod tight B Heelcord: mild tight R>L  POSTURE:  rounded shoulders and forward head  LUMBAR ROM:   Active  AROM  Eval - 07/05/22  Flexion Hands to mid shins - pulling  Extension 25% limited  Right lateral flexion Hand to fibular head - tight  Left lateral flexion Hand to fibular head - tight  Right rotation 25% limited  Left rotation 25% limited   (Blank rows = not tested)  LOWER EXTREMITY ROM:    WFL other than limitations due to muscle tightness  LOWER EXTREMITY MMT:    MMT Right Eval 07/05/22 Left Eval 07/05/22  Hip flexion 4- 4-  Hip extension 4 4-  Hip abduction 3+ 4-  Hip adduction 4 4  Hip internal rotation 4 4  Hip external rotation 4+ 4+  Knee flexion 4+ 4  Knee extension 5- 4+  Ankle dorsiflexion 4 4  Ankle plantarflexion 4+ 16 SLS HR 4+ 15 SLS HR  Ankle inversion    Ankle eversion     (Blank rows = not tested)    TODAY'S TREATMENT   07/05/22 THERAPEUTIC EXERCISE: Instruction in initial HEP (see below) to improve flexibility, strength and mobility.  Verbal and tactile cues throughout for technique.   PATIENT EDUCATION:  Education details: PT eval findings, anticipated POC, and initial HEP Person educated: Patient Education method: Explanation, Demonstration, Tactile cues, Verbal cues, and Handouts Education comprehension: verbalized understanding, returned demonstration, verbal cues required, tactile cues required, and needs further education   HOME EXERCISE PROGRAM: Access Code: [email or web address] URL: [email or web address] Date: 07/05/2022 Prepared by: Annie Paras  Exercises - Supine Hamstring Stretch with Strap  - 2 x daily - 7 x weekly - 3 reps - 30 sec hold - Supine  ITB Stretch with Strap  - 2 x daily - 7 x weekly - 3 reps - 30 sec hold - Supine Quadriceps Stretch with Strap on Table  - 2 x  daily - 7 x weekly - 3 reps - 30 sec hold - Supine Piriformis Stretch with Foot on Ground  - 2 x daily - 7 x weekly - 3 reps - 30 sec hold - Supine Lower Trunk Rotation  - 2 x daily - 7 x weekly - 5 reps - 10-15 sec hold - Supine Posterior Pelvic Tilt  - 2 x daily - 7 x weekly - 2 sets - 10 reps - 5 sec hold  ASSESSMENT:  CLINICAL IMPRESSION: Victoria Sharp is a 81 y.o. female who was seen today for physical therapy evaluation and treatment s/p L4-5 Minimally invasive transforaminal lumbar interbody fusion w/ Metrix on 03/07/22. She reports no pain since resolution of the initial post-op pain/soreness but does note some tightness. She has kept up with the exercises provided to her by [facility] PT and walks up to 1 mile at least 4-5x/wk but wants to get back to more regular exercising while being careful to not hurt her back. Current deficits include mildly limited lumbar AROM, decreased lumbopelvic and LE flexibility, and proximal > distal LE weakness. [place] will benefit from skilled PT to address above deficits, improve flexibility and core/LE strength to improve positional and activity tolerance and allow resumption of more regular exercise w/o limitations due to her back.  OBJECTIVE IMPAIRMENTS decreased activity tolerance, decreased mobility, decreased ROM, decreased strength, increased fascial restrictions, impaired perceived functional ability, increased muscle spasms, impaired flexibility, improper body mechanics, and postural dysfunction.   ACTIVITY LIMITATIONS carrying, lifting, bending, squatting, stairs, and caring for others  PARTICIPATION LIMITATIONS: meal prep, cleaning, laundry, shopping, community activity, and yard work  PERSONAL FACTORS Age, Fitness, Past/current experiences, Time since onset of injury/illness/exacerbation, and 3+ comorbidities: HTN; TIA;  migraines; breast cancer; RA; tachycardia; MVP; GERD  are also affecting patient's functional outcome.   REHAB POTENTIAL: Excellent  CLINICAL DECISION MAKING: Stable/uncomplicated  EVALUATION COMPLEXITY: Low   GOALS: Goals reviewed with patient? Yes  SHORT TERM GOALS: Target date: 07/26/2022   Patient will be independent with initial HEP.  Baseline:  Goal status: INITIAL   LONG TERM GOALS: Target date: 08/16/2022    Patient will demonstrate independent use of ongoing/advanced HEP to facilitate ability to maintain/progress functional gains from skilled physical therapy services.  Baseline:  Goal status: INITIAL  2.  Patient to demonstrate ability to achieve and maintain good spinal alignment/posturing and body mechanics needed for daily activities. Baseline:  Goal status: INITIAL  3.  Patient will demonstrate functional pain free lumbar ROM to perform ADLs.   Baseline:  Goal status: INITIAL  4  Patient will demonstrate improved B LE strength to >/= 4+/5 for improved stability and ease of mobility . Baseline:  Goal status: INITIAL  5.  Patient will report 63 on lumbar FOTO to demonstrate improved functional ability.  Baseline: 55 Goal status: INITIAL    PLAN: PT FREQUENCY: 2x/week  PT DURATION: 6 weeks  PLANNED INTERVENTIONS: Therapeutic exercises, Therapeutic activity, Neuromuscular re-education, Balance training, Gait training, Patient/Family education, Joint mobilization, Dry Needling, Electrical stimulation, Cryotherapy, Moist heat, Taping, Ultrasound, Ionotophoresis '4mg'$ /ml Dexamethasone, Manual therapy, and Re-evaluation.  PLAN FOR NEXT SESSION: Review initial HEP; gently progress lumbopelvic flexibility and strengthening; posture and body mechanics education to reduce risk for further injury to back; MT +/- DN as needed to address abnormal muscle tension and tightness   Percival Spanish, PT 07/05/2022, 12:00 PM

## 2022-07-05 ENCOUNTER — Ambulatory Visit: Payer: Medicare Other | Attending: Neurological Surgery | Admitting: Physical Therapy

## 2022-07-05 ENCOUNTER — Encounter: Payer: Self-pay | Admitting: Physical Therapy

## 2022-07-05 ENCOUNTER — Other Ambulatory Visit: Payer: Self-pay

## 2022-07-05 DIAGNOSIS — R29898 Other symptoms and signs involving the musculoskeletal system: Secondary | ICD-10-CM | POA: Insufficient documentation

## 2022-07-05 DIAGNOSIS — M62830 Muscle spasm of back: Secondary | ICD-10-CM | POA: Diagnosis present

## 2022-07-05 DIAGNOSIS — M6281 Muscle weakness (generalized): Secondary | ICD-10-CM | POA: Diagnosis present

## 2022-07-08 ENCOUNTER — Ambulatory Visit: Payer: Medicare Other

## 2022-07-08 DIAGNOSIS — M62830 Muscle spasm of back: Secondary | ICD-10-CM

## 2022-07-08 DIAGNOSIS — M6281 Muscle weakness (generalized): Secondary | ICD-10-CM | POA: Diagnosis not present

## 2022-07-08 DIAGNOSIS — R29898 Other symptoms and signs involving the musculoskeletal system: Secondary | ICD-10-CM

## 2022-07-08 NOTE — Therapy (Signed)
OUTPATIENT PHYSICAL THERAPY TREATMENT   Patient Name: Victoria Sharp MRN: [id number] DOB:11/14/1941, 81 y.o., female Today's Date: 07/08/2022   PT End of Session - 07/08/22 1019     Visit Number 2    Number of Visits 13    Date for PT Re-Evaluation 08/19/22    Authorization Type Medicare & An[employer] BCBS    PT Start Time [phone]    PT Stop Time 1012    PT Time Calculation (min) 39 min    Activity Tolerance Patient tolerated treatment well    Behavior During Therapy [facility] for tasks assessed/performed              Past Medical History:  Diagnosis Date   Cancer ([place])    Dermatitis    History of breast cancer    Hypertension    Migraines    PONV (postoperative nausea and vomiting)    Rheumatoid arthritis ([place])    Tachycardia    TIA (transient ischemic attack) 01/2020   questionable TIA vs migraine per neurologist   Past Surgical History:  Procedure Laterality Date   ABDOMINAL HYSTERECTOMY     ABLATION  08/2021   Dr. Ernestina Patches   BREAST LUMPECTOMY Left 1998   NEUROMA SURGERY Right 03/2020   TRANSFORAMINAL LUMBAR INTERBODY FUSION W/ MIS 1 LEVEL N/A 03/07/2022   Procedure: LUMBAR FOUR- FIVE,  MINIMALLY  INVASIVE TRANSFORAMINAL LUMBAR  INTERBODY FUSION  w/ METRIX;  Surgeon: Judith Part, MD;  Location: [place];  Service: Neurosurgery;  Laterality: N/A;  3C/RM 21   WEDGE RESECTION     Patient Active Problem List   Diagnosis Date Noted   Spondylolisthesis of lumbar region 03/07/2022   TIA (transient ischemic attack) 01/27/2021   Rheumatoid arthritis with rheumatoid factor of multiple sites without organ or systems involvement ([place]) 06/26/2020   Essential hypertension 06/26/2020   MVP (mitral valve prolapse) 06/26/2020   History of Barrett's esophagus 06/26/2020   History of gastroesophageal reflux (GERD) 06/26/2020   History of breast cancer 06/26/2020   High risk medication use 06/26/2020    PCP: [name]., MD  REFERRING PROVIDER: Judith Part,  MD  REFERRING DIAG: M43.16 (ICD-10-CM) - Spondylolisthesis, lumbar region  RATIONALE FOR EVALUATION AND TREATMENT: Rehabilitation  THERAPY DIAG:  Muscle weakness (generalized)  Other symptoms and signs involving the musculoskeletal system  Muscle spasm of back  ONSET DATE: 03/07/22 - L4-5 Minimally invasive transforaminal lumbar interbody fusion w/ Metrix  SUBJECTIVE:  SUBJECTIVE STATEMENT: Pt reports that she was sore the first day after doing her HEP, she does not think she can do exercises 2x per day.  PAIN:  Are you having pain? No  PERTINENT HISTORY:  L4-5 Minimally invasive transforaminal lumbar interbody fusion w/ Metrix - 03/07/22; HTN; TIA; migraines; breast cancer; RA; tachycardia; MVP; GERD  PRECAUTIONS: None  WEIGHT BEARING RESTRICTIONS No  FALLS:  Has patient fallen in last 6 months? No  LIVING ENVIRONMENT: Lives with: lives with their spouse Lives in: House/apartment Stairs: Yes: Internal: 14 steps; on left going up - to bonus room and bath (does not go up regularly) Has following equipment at home:  3-wheel rollator  OCCUPATION: Retired  PLOF: [place]: "To get back into exercising safely for my back."   OBJECTIVE:   DIAGNOSTIC FINDINGS:  10/09/21 - Lumbar MRI: 1. At L4-L5, moderate canal stenosis with bilateral subarticular recess narrowing and moderate right foraminal stenosis. Grade 1 anterolisthesis at this level.  2. At L3-L4, mild canal and right foraminal stenosis.  PATIENT SURVEYS:  FOTO Lumbar = 55; predicted = 62  SCREENING FOR RED FLAGS: Bowel or bladder incontinence: No Spinal tumors: No Cauda equina syndrome: No Compression fracture: No Abdominal aneurysm: No  COGNITION:  Overall cognitive status: Within functional limits for  tasks assessed     SENSATION: WFL  MUSCLE LENGTH: Hamstrings: mod tight B ITB: mod tight B Piriformis: mild/mod tight B Hip flexors: mod tight B Quads: mild/mod tight B Heelcord: mild tight R>L  POSTURE:  rounded shoulders and forward head  LUMBAR ROM:   Active  AROM  Eval - 07/05/22  Flexion Hands to mid shins - pulling  Extension 25% limited  Right lateral flexion Hand to fibular head - tight  Left lateral flexion Hand to fibular head - tight  Right rotation 25% limited  Left rotation 25% limited   (Blank rows = not tested)  LOWER EXTREMITY ROM:    WFL other than limitations due to muscle tightness  LOWER EXTREMITY MMT:    MMT Right Eval 07/05/22 Left Eval 07/05/22  Hip flexion 4- 4-  Hip extension 4 4-  Hip abduction 3+ 4-  Hip adduction 4 4  Hip internal rotation 4 4  Hip external rotation 4+ 4+  Knee flexion 4+ 4  Knee extension 5- 4+  Ankle dorsiflexion 4 4  Ankle plantarflexion 4+ 16 SLS HR 4+ 15 SLS HR  Ankle inversion    Ankle eversion     (Blank rows = not tested)    TODAY'S TREATMENT  07/08/22 Therapeutic Exercise: Nustep L2x74mn Supine HS stretch with strap 2x30" Supine ITB stretch with strap 2x30" Supine quad stretch with strap 2x30" Supine KTOS stretch 2x30" Supine pelvic tilt 10x5" LTR 5x15" hold Standing hip abduction, ext, marching 10x bil Standing lumbar extension 10x  07/05/22 THERAPEUTIC EXERCISE: Instruction in initial HEP (see below) to improve flexibility, strength and mobility.  Verbal and tactile cues throughout for technique.   PATIENT EDUCATION:  Education details: PT eval findings, anticipated POC, and initial HEP Person educated: Patient Education method: Explanation, Demonstration, Tactile cues, Verbal cues, and Handouts Education comprehension: verbalized understanding, returned demonstration, verbal cues required, tactile cues required, and needs further education   HOME EXERCISE PROGRAM: Access Code:  [email or web address] URL: [email or web address] Date: 07/05/2022 Prepared by: JAnnie Paras Exercises - Supine Hamstring Stretch with Strap  - 2 x daily - 7 x weekly - 3 reps - 30 sec hold - Supine ITB Stretch with Strap  -  2 x daily - 7 x weekly - 3 reps - 30 sec hold - Supine Quadriceps Stretch with Strap on Table  - 2 x daily - 7 x weekly - 3 reps - 30 sec hold - Supine Piriformis Stretch with Foot on Ground  - 2 x daily - 7 x weekly - 3 reps - 30 sec hold - Supine Lower Trunk Rotation  - 2 x daily - 7 x weekly - 5 reps - 10-15 sec hold - Supine Posterior Pelvic Tilt  - 2 x daily - 7 x weekly - 2 sets - 10 reps - 5 sec hold  ASSESSMENT:  CLINICAL IMPRESSION: Pt arrived noting soreness from HEP. Reviewed HEP, provided clarification on when to stop with stretches to avoid overstretching, as this was the cause of her soreness from HEP. Instructed on correct way to do PPT  with tactile and verbal cueing. After modification pt noted a good response to TE. We focused on clarifying HEP, stretching, mobility, and initiated light strengthening.   OBJECTIVE IMPAIRMENTS decreased activity tolerance, decreased mobility, decreased ROM, decreased strength, increased fascial restrictions, impaired perceived functional ability, increased muscle spasms, impaired flexibility, improper body mechanics, and postural dysfunction.   ACTIVITY LIMITATIONS carrying, lifting, bending, squatting, stairs, and caring for others  PARTICIPATION LIMITATIONS: meal prep, cleaning, laundry, shopping, community activity, and yard work  PERSONAL FACTORS Age, Fitness, Past/current experiences, Time since onset of injury/illness/exacerbation, and 3+ comorbidities: HTN; TIA; migraines; breast cancer; RA; tachycardia; MVP; GERD  are also affecting patient's functional outcome.   REHAB POTENTIAL: Excellent  CLINICAL DECISION MAKING: Stable/uncomplicated  EVALUATION COMPLEXITY: Low   GOALS: Goals reviewed with patient?  Yes  SHORT TERM GOALS: Target date: 07/26/2022   Patient will be independent with initial HEP.  Baseline:  Goal status: IN PROGRESS   LONG TERM GOALS: Target date: 08/16/2022    Patient will demonstrate independent use of ongoing/advanced HEP to facilitate ability to maintain/progress functional gains from skilled physical therapy services.  Baseline:  Goal status: IN PROGRESS  2.  Patient to demonstrate ability to achieve and maintain good spinal alignment/posturing and body mechanics needed for daily activities. Baseline:  Goal status: IN PROGRESS  3.  Patient will demonstrate functional pain free lumbar ROM to perform ADLs.   Baseline:  Goal status: IN PROGRESS  4  Patient will demonstrate improved B LE strength to >/= 4+/5 for improved stability and ease of mobility . Baseline:  Goal status: IN PROGRESS  5.  Patient will report 28 on lumbar FOTO to demonstrate improved functional ability.  Baseline: 55 Goal status: IN PROGRESS    PLAN: PT FREQUENCY: 2x/week  PT DURATION: 6 weeks  PLANNED INTERVENTIONS: Therapeutic exercises, Therapeutic activity, Neuromuscular re-education, Balance training, Gait training, Patient/Family education, Joint mobilization, Dry Needling, Electrical stimulation, Cryotherapy, Moist heat, Taping, Ultrasound, Ionotophoresis '4mg'$ /ml Dexamethasone, Manual therapy, and Re-evaluation.  PLAN FOR NEXT SESSION: gently progress lumbopelvic flexibility and strengthening; posture and body mechanics education to reduce risk for further injury to back; MT +/- DN as needed to address abnormal muscle tension and tightness   Jamesina Gaugh L Miguelina Fore, PTA 07/08/2022, 10:20 AM

## 2022-07-12 ENCOUNTER — Encounter: Payer: Self-pay | Admitting: Physical Therapy

## 2022-07-12 ENCOUNTER — Ambulatory Visit: Payer: Medicare Other | Admitting: Physical Therapy

## 2022-07-12 DIAGNOSIS — R29898 Other symptoms and signs involving the musculoskeletal system: Secondary | ICD-10-CM

## 2022-07-12 DIAGNOSIS — M62830 Muscle spasm of back: Secondary | ICD-10-CM

## 2022-07-12 DIAGNOSIS — M6281 Muscle weakness (generalized): Secondary | ICD-10-CM | POA: Diagnosis not present

## 2022-07-12 NOTE — Therapy (Signed)
OUTPATIENT PHYSICAL THERAPY TREATMENT   Patient Name: Victoria Sharp MRN: [id number] DOB:1941/09/28, 81 y.o., female Today's Date: 07/12/2022   PT End of Session - 07/12/22 0850     Visit Number 3    Number of Visits 13    Date for PT Re-Evaluation 08/19/22    Authorization Type Medicare & [employer]    PT Start Time [phone]    PT Stop Time 0929    PT Time Calculation (min) 39 min    Activity Tolerance Patient tolerated treatment well    Behavior During Therapy [facility] for tasks assessed/performed               Past Medical History:  Diagnosis Date   Cancer ([place])    Dermatitis    History of breast cancer    Hypertension    Migraines    PONV (postoperative nausea and vomiting)    Rheumatoid arthritis ([place])    Tachycardia    TIA (transient ischemic attack) 01/2020   questionable TIA vs migraine per neurologist   Past Surgical History:  Procedure Laterality Date   ABDOMINAL HYSTERECTOMY     ABLATION  08/2021   Dr. Ernestina Patches   BREAST LUMPECTOMY Left 1998   NEUROMA SURGERY Right 03/2020   TRANSFORAMINAL LUMBAR INTERBODY FUSION W/ MIS 1 LEVEL N/A 03/07/2022   Procedure: LUMBAR FOUR- FIVE,  MINIMALLY  INVASIVE TRANSFORAMINAL LUMBAR  INTERBODY FUSION  w/ METRIX;  Surgeon: Judith Part, MD;  Location: [place];  Service: Neurosurgery;  Laterality: N/A;  3C/RM 21   WEDGE RESECTION     Patient Active Problem List   Diagnosis Date Noted   Spondylolisthesis of lumbar region 03/07/2022   TIA (transient ischemic attack) 01/27/2021   Rheumatoid arthritis with rheumatoid factor of multiple sites without organ or systems involvement ([place]) 06/26/2020   Essential hypertension 06/26/2020   MVP (mitral valve prolapse) 06/26/2020   History of Barrett's esophagus 06/26/2020   History of gastroesophageal reflux (GERD) 06/26/2020   History of breast cancer 06/26/2020   High risk medication use 06/26/2020    PCP: [name]., MD  REFERRING PROVIDER: Judith Part, MD  REFERRING DIAG: M43.16 (ICD-10-CM) - Spondylolisthesis, lumbar region  RATIONALE FOR EVALUATION AND TREATMENT: Rehabilitation  THERAPY DIAG:  Muscle weakness (generalized)  Other symptoms and signs involving the musculoskeletal system  Muscle spasm of back  ONSET DATE: 03/07/22 - L4-5 Minimally invasive transforaminal lumbar interbody fusion w/ Metrix  SUBJECTIVE:  SUBJECTIVE STATEMENT: Pt reports she is walking her mile per day and has increased her HEP to 2x/day with good tolerance.  PAIN:  Are you having pain? No  PERTINENT HISTORY:  L4-5 Minimally invasive transforaminal lumbar interbody fusion w/ Metrix - 03/07/22; HTN; TIA; migraines; breast cancer; RA; tachycardia; MVP; GERD  PRECAUTIONS: None  WEIGHT BEARING RESTRICTIONS No  FALLS:  Has patient fallen in last 6 months? No  LIVING ENVIRONMENT: Lives with: lives with their spouse Lives in: House/apartment Stairs: Yes: Internal: 14 steps; on left going up - to bonus room and bath (does not go up regularly) Has following equipment at home:  3-wheel rollator  OCCUPATION: Retired  PLOF: [place]: "To get back into exercising safely for my back."   OBJECTIVE:   DIAGNOSTIC FINDINGS:  10/09/21 - Lumbar MRI: 1. At L4-L5, moderate canal stenosis with bilateral subarticular recess narrowing and moderate right foraminal stenosis. Grade 1 anterolisthesis at this level.  2. At L3-L4, mild canal and right foraminal stenosis.  PATIENT SURVEYS:  FOTO Lumbar = 55; predicted = 62  SCREENING FOR RED FLAGS: Bowel or bladder incontinence: No Spinal tumors: No Cauda equina syndrome: No Compression fracture: No Abdominal aneurysm: No  COGNITION:  Overall cognitive status: Within functional limits for tasks  assessed     SENSATION: WFL  MUSCLE LENGTH: Hamstrings: mod tight B ITB: mod tight B Piriformis: mild/mod tight B Hip flexors: mod tight B Quads: mild/mod tight B Heelcord: mild tight R>L  POSTURE:  rounded shoulders and forward head  LUMBAR ROM:   Active  AROM  Eval - 07/05/22  Flexion Hands to mid shins - pulling  Extension 25% limited  Right lateral flexion Hand to fibular head - tight  Left lateral flexion Hand to fibular head - tight  Right rotation 25% limited  Left rotation 25% limited   (Blank rows = not tested)  LOWER EXTREMITY ROM:    WFL other than limitations due to muscle tightness  LOWER EXTREMITY MMT:    MMT Right Eval 07/05/22 Left Eval 07/05/22  Hip flexion 4- 4-  Hip extension 4 4-  Hip abduction 3+ 4-  Hip adduction 4 4  Hip internal rotation 4 4  Hip external rotation 4+ 4+  Knee flexion 4+ 4  Knee extension 5- 4+  Ankle dorsiflexion 4 4  Ankle plantarflexion 4+ 16 SLS HR 4+ 15 SLS HR  Ankle inversion    Ankle eversion     (Blank rows = not tested)    TODAY'S TREATMENT   07/12/22 THERAPEUTIC EXERCISE: to improve flexibility, strength and mobility.  Verbal and tactile cues throughout for technique. Mod thomas hip flexor/quad stretch with strap 2 x 30" Hooklying PPT + red TB clam 10 x 3" Hooklying PPT + red TB march 10 x 3" Hooklying PPT + red TB alternating unilateral clam 10 x 3" Bridge + red TB hip ABD isometric 10 x 5" Standing B hip abduction, ext, marching x 10 each Counter squat 10 x 3", 2 sets   07/08/22 Therapeutic Exercise: Nustep L2x55mn Supine HS stretch with strap 2x30" Supine ITB stretch with strap 2x30" Supine quad stretch with strap 2x30" Supine KTOS stretch 2x30" Supine pelvic tilt 10x5" LTR 5x15" hold Standing hip abduction, ext, marching 10x bil Standing lumbar extension 10x   07/05/22 THERAPEUTIC EXERCISE: Instruction in initial HEP (see below) to improve flexibility, strength and mobility.  Verbal and  tactile cues throughout for technique.   PATIENT EDUCATION:  Education details: PT eval findings,  anticipated POC, and initial HEP Person educated: Patient Education method: Explanation, Demonstration, Tactile cues, Verbal cues, and Handouts Education comprehension: verbalized understanding, returned demonstration, verbal cues required, tactile cues required, and needs further education   HOME EXERCISE PROGRAM: Access Code: [email or web address] URL: [email or web address] Date: 07/12/2022 Prepared by: Annie Paras  Exercises - Supine Hamstring Stretch with Strap  - 2 x daily - 7 x weekly - 3 reps - 30 sec hold - Supine ITB Stretch with Strap  - 2 x daily - 7 x weekly - 3 reps - 30 sec hold - Supine Quadriceps Stretch with Strap on Table  - 2 x daily - 7 x weekly - 3 reps - 30 sec hold - Supine Piriformis Stretch with Foot on Ground  - 2 x daily - 7 x weekly - 3 reps - 30 sec hold - Supine Lower Trunk Rotation  - 2 x daily - 7 x weekly - 5 reps - 10-15 sec hold - Supine Posterior Pelvic Tilt  - 2 x daily - 7 x weekly - 2 sets - 10 reps - 5 sec hold - Hooklying Clamshell with Resistance  - 1 x daily - 7 x weekly - 2 sets - 10 reps - 3-5 sec hold - Supine March with Resistance Band  - 1 x daily - 7 x weekly - 2 sets - 10 reps - 2-3 sec hold hold - Bridge with Resistance  - 1 x daily - 7 x weekly - 2 sets - 10 reps - 5 sec hold  ASSESSMENT:  CLINICAL IMPRESSION: Inayah reports soreness from HEP has settled down and she has increased the daily frequency to 2x/day along with her daily walks. Her only question with the HEP at this point was clarification of the mod thomas hip flexor/quad stretch - following review she reports better understanding and now denies any further concerns with the HEP - STG #1 met. Lumbopelvic strengthening progressed utilizing PPT for core activation in hooklying along with cues for neutral spine with standing proximal LE strengthening. Good tolerance for exercise  progression, therefore HEP updated to reflect strengthening progression. Pt asking questions about proper mechanics for picking things up from the floor, therefore will plan for formal instruction in posture and body mechanics next visit.  OBJECTIVE IMPAIRMENTS decreased activity tolerance, decreased mobility, decreased ROM, decreased strength, increased fascial restrictions, impaired perceived functional ability, increased muscle spasms, impaired flexibility, improper body mechanics, and postural dysfunction.   ACTIVITY LIMITATIONS carrying, lifting, bending, squatting, stairs, and caring for others  PARTICIPATION LIMITATIONS: meal prep, cleaning, laundry, shopping, community activity, and yard work  PERSONAL FACTORS Age, Fitness, Past/current experiences, Time since onset of injury/illness/exacerbation, and 3+ comorbidities: HTN; TIA; migraines; breast cancer; RA; tachycardia; MVP; GERD  are also affecting patient's functional outcome.   REHAB POTENTIAL: Excellent  CLINICAL DECISION MAKING: Stable/uncomplicated  EVALUATION COMPLEXITY: Low   GOALS: Goals reviewed with patient? Yes  SHORT TERM GOALS: Target date: 07/26/2022   Patient will be independent with initial HEP.  Baseline:  Goal status: MET  07/12/22   LONG TERM GOALS: Target date: 08/16/2022    Patient will demonstrate independent use of ongoing/advanced HEP to facilitate ability to maintain/progress functional gains from skilled physical therapy services.  Baseline:  Goal status: IN PROGRESS  2.  Patient to demonstrate ability to achieve and maintain good spinal alignment/posturing and body mechanics needed for daily activities. Baseline:  Goal status: IN PROGRESS  3.  Patient will demonstrate functional pain free lumbar ROM to perform ADLs.  Baseline:  Goal status: IN PROGRESS  4  Patient will demonstrate improved B LE strength to >/= 4+/5 for improved stability and ease of mobility . Baseline:  Goal status: IN  PROGRESS  5.  Patient will report 65 on lumbar FOTO to demonstrate improved functional ability.  Baseline: 55 Goal status: IN PROGRESS    PLAN: PT FREQUENCY: 2x/week  PT DURATION: 6 weeks  PLANNED INTERVENTIONS: Therapeutic exercises, Therapeutic activity, Neuromuscular re-education, Balance training, Gait training, Patient/Family education, Joint mobilization, Dry Needling, Electrical stimulation, Cryotherapy, Moist heat, Taping, Ultrasound, Ionotophoresis 75m/ml Dexamethasone, Manual therapy, and Re-evaluation.  PLAN FOR NEXT SESSION: posture and body mechanics education to reduce risk for further injury to back; gently progress lumbopelvic flexibility and strengthening; MT +/- DN as needed to address abnormal muscle tension and tightness   JPercival Spanish PT 07/12/2022, 9:31 AM

## 2022-07-15 ENCOUNTER — Ambulatory Visit: Payer: Medicare Other

## 2022-07-15 DIAGNOSIS — M6281 Muscle weakness (generalized): Secondary | ICD-10-CM | POA: Diagnosis not present

## 2022-07-15 DIAGNOSIS — R29898 Other symptoms and signs involving the musculoskeletal system: Secondary | ICD-10-CM

## 2022-07-15 DIAGNOSIS — M62830 Muscle spasm of back: Secondary | ICD-10-CM

## 2022-07-15 NOTE — Therapy (Signed)
OUTPATIENT PHYSICAL THERAPY TREATMENT   Patient Name: Victoria Sharp MRN: [id number] DOB:[birth year], 81 y.o., female Today's Date: 07/15/2022   PT End of Session - 07/15/22 0930     Visit Number 4    Number of Visits 13    Date for PT Re-Evaluation 08/19/22    Authorization Type Medicare & [employer] BCBS    PT Start Time [phone]    PT Stop Time 0930    PT Time Calculation (min) 47 min    Activity Tolerance Patient tolerated treatment well    Behavior During Therapy [facility] for tasks assessed/performed                Past Medical History:  Diagnosis Date   Cancer ([place])    Dermatitis    History of breast cancer    Hypertension    Migraines    PONV (postoperative nausea and vomiting)    Rheumatoid arthritis ([place])    Tachycardia    TIA (transient ischemic attack) 01/2020   questionable TIA vs migraine per neurologist   Past Surgical History:  Procedure Laterality Date   ABDOMINAL HYSTERECTOMY     ABLATION  08/2021   Dr. Ernestina Patches   BREAST LUMPECTOMY Left 1998   NEUROMA SURGERY Right 03/2020   TRANSFORAMINAL LUMBAR INTERBODY FUSION W/ MIS 1 LEVEL N/A 03/07/2022   Procedure: LUMBAR FOUR- FIVE,  MINIMALLY  INVASIVE TRANSFORAMINAL LUMBAR  INTERBODY FUSION  w/ METRIX;  Surgeon: Judith Part, MD;  Location: [place];  Service: Neurosurgery;  Laterality: N/A;  3C/RM 21   WEDGE RESECTION     Patient Active Problem List   Diagnosis Date Noted   Spondylolisthesis of lumbar region 03/07/2022   TIA (transient ischemic attack) 01/27/2021   Rheumatoid arthritis with rheumatoid factor of multiple sites without organ or systems involvement ([place]) 06/26/2020   Essential hypertension 06/26/2020   MVP (mitral valve prolapse) 06/26/2020   History of Barrett's esophagus 06/26/2020   History of gastroesophageal reflux (GERD) 06/26/2020   History of breast cancer 06/26/2020   High risk medication use 06/26/2020    PCP: [name]., MD  REFERRING PROVIDER: Judith Part, MD  REFERRING DIAG: M43.16 (ICD-10-CM) - Spondylolisthesis, lumbar region  RATIONALE FOR EVALUATION AND TREATMENT: Rehabilitation  THERAPY DIAG:  Muscle weakness (generalized)  Other symptoms and signs involving the musculoskeletal system  Muscle spasm of back  ONSET DATE: 03/07/22 - L4-5 Minimally invasive transforaminal lumbar interbody fusion w/ Metrix  SUBJECTIVE:  SUBJECTIVE STATEMENT: Pt reports helping out at church with [employer], doing more UE work reaching [facility] and across her body.   PAIN:  Are you having pain? No  PERTINENT HISTORY:  L4-5 Minimally invasive transforaminal lumbar interbody fusion w/ Metrix - 03/07/22; HTN; TIA; migraines; breast cancer; RA; tachycardia; MVP; GERD  PRECAUTIONS: None  WEIGHT BEARING RESTRICTIONS No  FALLS:  Has patient fallen in last 6 months? No  LIVING ENVIRONMENT: Lives with: lives with their spouse Lives in: House/apartment Stairs: Yes: Internal: 14 steps; on left going up - to bonus room and bath (does not go up regularly) Has following equipment at home:  3-wheel rollator  OCCUPATION: Retired  PLOF: [place]: "To get back into exercising safely for my back."   OBJECTIVE:   DIAGNOSTIC FINDINGS:  10/09/21 - Lumbar MRI: 1. At L4-L5, moderate canal stenosis with bilateral subarticular recess narrowing and moderate right foraminal stenosis. Grade 1 anterolisthesis at this level.  2. At L3-L4, mild canal and right foraminal stenosis.  PATIENT SURVEYS:  FOTO Lumbar = 55; predicted = 62  SCREENING FOR RED FLAGS: Bowel or bladder incontinence: No Spinal tumors: No Cauda equina syndrome: No Compression fracture: No Abdominal aneurysm: No  COGNITION:  Overall cognitive status: Within functional limits for  tasks assessed     SENSATION: WFL  MUSCLE LENGTH: Hamstrings: mod tight B ITB: mod tight B Piriformis: mild/mod tight B Hip flexors: mod tight B Quads: mild/mod tight B Heelcord: mild tight R>L  POSTURE:  rounded shoulders and forward head  LUMBAR ROM:   Active  AROM  Eval - 07/05/22  Flexion Hands to mid shins - pulling  Extension 25% limited  Right lateral flexion Hand to fibular head - tight  Left lateral flexion Hand to fibular head - tight  Right rotation 25% limited  Left rotation 25% limited   (Blank rows = not tested)  LOWER EXTREMITY ROM:    WFL other than limitations due to muscle tightness  LOWER EXTREMITY MMT:    MMT Right Eval 07/05/22 Left Eval 07/05/22  Hip flexion 4- 4-  Hip extension 4 4-  Hip abduction 3+ 4-  Hip adduction 4 4  Hip internal rotation 4 4  Hip external rotation 4+ 4+  Knee flexion 4+ 4  Knee extension 5- 4+  Ankle dorsiflexion 4 4  Ankle plantarflexion 4+ 16 SLS HR 4+ 15 SLS HR  Ankle inversion    Ankle eversion     (Blank rows = not tested)    TODAY'S TREATMENT  07/15/22 Therapeutic Exercise: Nustep L5x48mn  Review of posture and body mechanics with handout provided Squats 20x at treadmill  Standing hip abduction, extension with yellow TB 10x each Sidesteps 2x18 ft, down/back yellow TB Multifidus walkout with green TB x 10 R/L Standing row with green TB x 10 Standing extension with green TB x 10 Seated piriformis stretch KTOS x 30 sec Seated figure 4 stretch x 30 sec  07/12/22 THERAPEUTIC EXERCISE: to improve flexibility, strength and mobility.  Verbal and tactile cues throughout for technique. Mod thomas hip flexor/quad stretch with strap 2 x 30" Hooklying PPT + red TB clam 10 x 3" Hooklying PPT + red TB march 10 x 3" Hooklying PPT + red TB alternating unilateral clam 10 x 3" Bridge + red TB hip ABD isometric 10 x 5" Standing B hip abduction, ext, marching x 10 each Counter squat 10 x 3", 2  sets   07/08/22 Therapeutic Exercise: Nustep L2x643m Supine HS  stretch with strap 2x30" Supine ITB stretch with strap 2x30" Supine quad stretch with strap 2x30" Supine KTOS stretch 2x30" Supine pelvic tilt 10x5" LTR 5x15" hold Standing hip abduction, ext, marching 10x bil Standing lumbar extension 10x    PATIENT EDUCATION:  Education details: PT eval findings, anticipated POC, and initial HEP Person educated: Patient Education method: Explanation, Demonstration, Tactile cues, Verbal cues, and Handouts Education comprehension: verbalized understanding, returned demonstration, verbal cues required, tactile cues required, and needs further education   HOME EXERCISE PROGRAM: Access Code: [email or web address] URL: [email or web address] Date: 07/15/2022 Prepared by: Clarene Essex  Exercises - Supine Hamstring Stretch with Strap  - 2 x daily - 7 x weekly - 3 reps - 30 sec hold - Supine ITB Stretch with Strap  - 2 x daily - 7 x weekly - 3 reps - 30 sec hold - Supine Quadriceps Stretch with Strap on Table  - 2 x daily - 7 x weekly - 3 reps - 30 sec hold - Supine Piriformis Stretch with Foot on Ground  - 2 x daily - 7 x weekly - 3 reps - 30 sec hold - Supine Lower Trunk Rotation  - 2 x daily - 7 x weekly - 5 reps - 10-15 sec hold - Supine Posterior Pelvic Tilt  - 2 x daily - 7 x weekly - 2 sets - 10 reps - 5 sec hold - Hooklying Clamshell with Resistance  - 1 x daily - 7 x weekly - 2 sets - 10 reps - 3-5 sec hold - Supine March with Resistance Band  - 1 x daily - 7 x weekly - 2 sets - 10 reps - 2-3 sec hold hold - Bridge with Resistance  - 1 x daily - 7 x weekly - 2 sets - 10 reps - 5 sec hold  Patient Education - Posture and Body Mechanics  ASSESSMENT:  CLINICAL IMPRESSION: Progressed hip strengthening and core stability. Provided instruction on posture and body mechanics and provided handout. Progressed glute stretches to sitting for HEP. Cues were given through exercises to  correct form especially with squats to prevent knees over toes.  OBJECTIVE IMPAIRMENTS decreased activity tolerance, decreased mobility, decreased ROM, decreased strength, increased fascial restrictions, impaired perceived functional ability, increased muscle spasms, impaired flexibility, improper body mechanics, and postural dysfunction.   ACTIVITY LIMITATIONS carrying, lifting, bending, squatting, stairs, and caring for others  PARTICIPATION LIMITATIONS: meal prep, cleaning, laundry, shopping, community activity, and yard work  PERSONAL FACTORS Age, Fitness, Past/current experiences, Time since onset of injury/illness/exacerbation, and 3+ comorbidities: HTN; TIA; migraines; breast cancer; RA; tachycardia; MVP; GERD  are also affecting patient's functional outcome.   REHAB POTENTIAL: Excellent  CLINICAL DECISION MAKING: Stable/uncomplicated  EVALUATION COMPLEXITY: Low   GOALS: Goals reviewed with patient? Yes  SHORT TERM GOALS: Target date: 07/26/2022   Patient will be independent with initial HEP.  Baseline:  Goal status: MET  07/12/22   LONG TERM GOALS: Target date: 08/16/2022    Patient will demonstrate independent use of ongoing/advanced HEP to facilitate ability to maintain/progress functional gains from skilled physical therapy services.  Baseline:  Goal status: IN PROGRESS  2.  Patient to demonstrate ability to achieve and maintain good spinal alignment/posturing and body mechanics needed for daily activities. Baseline:  Goal status: IN PROGRESS  3.  Patient will demonstrate functional pain free lumbar ROM to perform ADLs.   Baseline:  Goal status: IN PROGRESS  4  Patient will demonstrate improved B LE strength to >/= 4+/5 for improved stability  and ease of mobility . Baseline:  Goal status: IN PROGRESS  5.  Patient will report 65 on lumbar FOTO to demonstrate improved functional ability.  Baseline: 55 Goal status: IN PROGRESS    PLAN: PT FREQUENCY:  2x/week  PT DURATION: 6 weeks  PLANNED INTERVENTIONS: Therapeutic exercises, Therapeutic activity, Neuromuscular re-education, Balance training, Gait training, Patient/Family education, Joint mobilization, Dry Needling, Electrical stimulation, Cryotherapy, Moist heat, Taping, Ultrasound, Ionotophoresis 7m/ml Dexamethasone, Manual therapy, and Re-evaluation.  PLAN FOR NEXT SESSION: posture and body mechanics education to reduce risk for further injury to back; gently progress lumbopelvic flexibility and strengthening; MT +/- DN as needed to address abnormal muscle tension and tightness   Kojo Liby L Ophia Shamoon, PTA 07/15/2022, 9:31 AM

## 2022-07-19 ENCOUNTER — Ambulatory Visit: Payer: Medicare Other | Admitting: Physical Therapy

## 2022-07-19 ENCOUNTER — Encounter: Payer: Self-pay | Admitting: Physical Therapy

## 2022-07-19 DIAGNOSIS — M62830 Muscle spasm of back: Secondary | ICD-10-CM

## 2022-07-19 DIAGNOSIS — R29898 Other symptoms and signs involving the musculoskeletal system: Secondary | ICD-10-CM

## 2022-07-19 DIAGNOSIS — M6281 Muscle weakness (generalized): Secondary | ICD-10-CM | POA: Diagnosis not present

## 2022-07-19 NOTE — Therapy (Signed)
OUTPATIENT PHYSICAL THERAPY TREATMENT   Patient Name: Victoria Sharp MRN: [id number] DOB:13-Sep-1941, 81 y.o., female Today's Date: 07/19/2022   PT End of Session - 07/19/22 0847     Visit Number 5    Number of Visits 13    Date for PT Re-Evaluation 08/19/22    Authorization Type Medicare & [employer] BCBS    PT Start Time [phone]    PT Stop Time 0933    PT Time Calculation (min) 46 min    Activity Tolerance Patient tolerated treatment well    Behavior During Therapy [facility] for tasks assessed/performed                 Past Medical History:  Diagnosis Date   Cancer ([place])    Dermatitis    History of breast cancer    Hypertension    Migraines    PONV (postoperative nausea and vomiting)    Rheumatoid arthritis ([place])    Tachycardia    TIA (transient ischemic attack) 01/2020   questionable TIA vs migraine per neurologist   Past Surgical History:  Procedure Laterality Date   ABDOMINAL HYSTERECTOMY     ABLATION  08/2021   Dr. Ernestina Patches   BREAST LUMPECTOMY Left 1998   NEUROMA SURGERY Right 03/2020   TRANSFORAMINAL LUMBAR INTERBODY FUSION W/ MIS 1 LEVEL N/A 03/07/2022   Procedure: LUMBAR FOUR- FIVE,  MINIMALLY  INVASIVE TRANSFORAMINAL LUMBAR  INTERBODY FUSION  w/ METRIX;  Surgeon: Judith Part, MD;  Location: [facility];  Service: Neurosurgery;  Laterality: N/A;  3C/RM 21   WEDGE RESECTION     Patient Active Problem List   Diagnosis Date Noted   Spondylolisthesis of lumbar region 03/07/2022   TIA (transient ischemic attack) 01/27/2021   Rheumatoid arthritis with rheumatoid factor of multiple sites without organ or systems involvement ([place]) 06/26/2020   Essential hypertension 06/26/2020   MVP (mitral valve prolapse) 06/26/2020   History of Barrett's esophagus 06/26/2020   History of gastroesophageal reflux (GERD) 06/26/2020   History of breast cancer 06/26/2020   High risk medication use 06/26/2020    PCP: [name]., MD  REFERRING PROVIDER: Judith Part, MD  REFERRING DIAG: M43.16 (ICD-10-CM) - Spondylolisthesis, lumbar region  RATIONALE FOR EVALUATION AND TREATMENT: Rehabilitation  THERAPY DIAG:  Muscle weakness (generalized)  Other symptoms and signs involving the musculoskeletal system  Muscle spasm of back  ONSET DATE: 03/07/22 - L4-5 Minimally invasive transforaminal lumbar interbody fusion w/ Metrix  SUBJECTIVE:  SUBJECTIVE STATEMENT: Pt reports pain has not been an issue recently. She reports she feels like she is "doing so well."  PAIN:  Are you having pain? No  PERTINENT HISTORY:  L4-5 Minimally invasive transforaminal lumbar interbody fusion w/ Metrix - 03/07/22; HTN; TIA; migraines; breast cancer; RA; tachycardia; MVP; GERD  PRECAUTIONS: None  WEIGHT BEARING RESTRICTIONS No  FALLS:  Has patient fallen in last 6 months? No  LIVING ENVIRONMENT: Lives with: lives with their spouse Lives in: House/apartment Stairs: Yes: Internal: 14 steps; on left going up - to bonus room and bath (does not go up regularly) Has following equipment at home:  3-wheel rollator  OCCUPATION: Retired  PLOF: [place]: "To get back into exercising safely for my back."   OBJECTIVE:   DIAGNOSTIC FINDINGS:  10/09/21 - Lumbar MRI: 1. At L4-L5, moderate canal stenosis with bilateral subarticular recess narrowing and moderate right foraminal stenosis. Grade 1 anterolisthesis at this level.  2. At L3-L4, mild canal and right foraminal stenosis.  PATIENT SURVEYS:  FOTO Lumbar = 55; predicted = 62  SCREENING FOR RED FLAGS: Bowel or bladder incontinence: No Spinal tumors: No Cauda equina syndrome: No Compression fracture: No Abdominal aneurysm: No  COGNITION:  Overall cognitive status: Within functional limits for tasks  assessed     SENSATION: WFL  MUSCLE LENGTH: Hamstrings: mod tight B ITB: mod tight B Piriformis: mild/mod tight B Hip flexors: mod tight B Quads: mild/mod tight B Heelcord: mild tight R>L  POSTURE:  rounded shoulders and forward head  LUMBAR ROM:   Active  AROM  Eval - 07/05/22  Flexion Hands to mid shins - pulling  Extension 25% limited  Right lateral flexion Hand to fibular head - tight  Left lateral flexion Hand to fibular head - tight  Right rotation 25% limited  Left rotation 25% limited   (Blank rows = not tested)  LOWER EXTREMITY ROM:    WFL other than limitations due to muscle tightness  LOWER EXTREMITY MMT:    MMT Right Eval 07/05/22 Left Eval 07/05/22  Hip flexion 4- 4-  Hip extension 4 4-  Hip abduction 3+ 4-  Hip adduction 4 4  Hip internal rotation 4 4  Hip external rotation 4+ 4+  Knee flexion 4+ 4  Knee extension 5- 4+  Ankle dorsiflexion 4 4  Ankle plantarflexion 4+ 16 SLS HR 4+ 15 SLS HR  Ankle inversion    Ankle eversion     (Blank rows = not tested)    TODAY'S TREATMENT   07/19/22 THERAPEUTIC EXERCISE: to improve flexibility, strength and mobility.  Verbal and tactile cues throughout for technique. NuStep - L6 x 6 min BATCA low row 20# 2 x 10 BATCA knee flexion 20# 2 x 10 BATCA knee extension 15# 2 x 10 BATCA leg press 20# 2 x 10 BATCA lat pull down 15# 2 x 10 - 1 set each seated and standing Standing GTB row x 10 Standing GTB scap retraction + shoulder extension to neutral x 10 Standing GTB lat pull down x 10 Standing YTB B hip abduction, extension and flexion march x 10 each   07/15/22 Therapeutic Exercise: Nustep L5x11mn  Review of posture and body mechanics with handout provided Squats 20x at treadmill  Standing hip abduction, extension with yellow TB 10x each Sidesteps 2x18 ft, down/back yellow TB Multifidus walkout with green TB x 10 R/L Standing row with green TB x 10 Standing extension with green TB x 10 Seated  piriformis stretch KTOS  x 30 sec Seated figure 4 stretch x 30 sec   07/12/22 THERAPEUTIC EXERCISE: to improve flexibility, strength and mobility.  Verbal and tactile cues throughout for technique. Mod thomas hip flexor/quad stretch with strap 2 x 30" Hooklying PPT + red TB clam 10 x 3" Hooklying PPT + red TB march 10 x 3" Hooklying PPT + red TB alternating unilateral clam 10 x 3" Bridge + red TB hip ABD isometric 10 x 5" Standing B hip abduction, ext, marching x 10 each Counter squat 10 x 3", 2 sets   PATIENT EDUCATION:  Education details: HEP update - upper body theraband exercises, HEP modification - strengthening exercise frequency adjusted to 3x/wk (qod), and Use of gym machines for strengthening Person educated: Patient Education method: Explanation, Demonstration, Verbal cues, and Handouts Education comprehension: verbalized understanding, returned demonstration, verbal cues required, and needs further education   HOME EXERCISE PROGRAM: Access Code: [email or web address] URL: [email or web address] Date: 07/19/2022 Prepared by: Annie Paras  Exercises - Seated Piriformis Stretch  - 2 x daily - 7 x weekly - 3 sets - 2 reps - 30 sec hold - Seated Piriformis Stretch with Trunk Bend  - 2 x daily - 7 x weekly - 3 sets - 2 reps - 30 sec hold - Supine Hamstring Stretch with Strap  - 2 x daily - 7 x weekly - 3 reps - 30 sec hold - Supine ITB Stretch with Strap  - 2 x daily - 7 x weekly - 3 reps - 30 sec hold - Supine Quadriceps Stretch with Strap on Table  - 2 x daily - 7 x weekly - 3 reps - 30 sec hold - Supine Lower Trunk Rotation  - 2 x daily - 7 x weekly - 5 reps - 10-15 sec hold - Supine Posterior Pelvic Tilt  - 2 x daily - 7 x weekly - 2 sets - 10 reps - 5 sec hold - Hooklying Clamshell with Resistance  - 1 x daily - 3 x weekly - 2 sets - 10 reps - 3-5 sec hold - Supine March with Resistance Band  - 1 x daily - 3 x weekly - 2 sets - 10 reps - 2-3 sec hold hold - Bridge with  Resistance  - 1 x daily - 3 x weekly - 2 sets - 10 reps - 5 sec hold - Standing Hip Abduction with Resistance at Ankles and Counter Support  - 1 x daily - 3 x weekly - 2 sets - 10 reps - 3 sec hold - Standing Hip Extension with Resistance at Ankles and Counter Support  - 1 x daily - 3 x weekly - 2 sets - 10 reps - 3 sec hold - Marching with Resistance  - 1 x daily - 3 x weekly - 2 sets - 10 reps - 3 sec hold - Standing Bilateral Low Shoulder Row with Anchored Resistance  - 1 x daily - 3 x weekly - 2 sets - 10 reps - 5 sec hold - Scapular Retraction with Resistance Advanced  - 1 x daily - 3 x weekly - 2 sets - 10 reps - 5 sec hold - Standing Lat Pull Down with Resistance - Elbows Bent  - 1 x daily - 3 x weekly - 2 sets - 10 reps - 3 sec hold  Patient Education - Posture and Body Mechanics  ASSESSMENT:  CLINICAL IMPRESSION: Kristine feels that she is "doing so well" with PT thus far. She states her HEP is going well  and she has remained pain free recently. She expressed interest in potentially going to the gym to workout, therefore reviewed appropriate gym weight machines including proper set-up and desired movements and muscle activation. Also provided instruction in similar exercises to gym machines using  theraband to target back/core strengthening with good tolerance - HEP updated accordingly with pt instructed to split up strengthening exercises performing exercises on alternating days to allow for muscle recovery.  OBJECTIVE IMPAIRMENTS decreased activity tolerance, decreased mobility, decreased ROM, decreased strength, increased fascial restrictions, impaired perceived functional ability, increased muscle spasms, impaired flexibility, improper body mechanics, and postural dysfunction.   ACTIVITY LIMITATIONS carrying, lifting, bending, squatting, stairs, and caring for others  PARTICIPATION LIMITATIONS: meal prep, cleaning, laundry, shopping, community activity, and yard work  PERSONAL  FACTORS Age, Fitness, Past/current experiences, Time since onset of injury/illness/exacerbation, and 3+ comorbidities: HTN; TIA; migraines; breast cancer; RA; tachycardia; MVP; GERD  are also affecting patient's functional outcome.   REHAB POTENTIAL: Excellent  CLINICAL DECISION MAKING: Stable/uncomplicated  EVALUATION COMPLEXITY: Low   GOALS: Goals reviewed with patient? Yes  SHORT TERM GOALS: Target date: 07/26/2022   Patient will be independent with initial HEP.  Baseline:  Goal status: MET  07/12/22   LONG TERM GOALS: Target date: 08/16/2022    Patient will demonstrate independent use of ongoing/advanced HEP to facilitate ability to maintain/progress functional gains from skilled physical therapy services.  Baseline:  Goal status: IN PROGRESS  2.  Patient to demonstrate ability to achieve and maintain good spinal alignment/posturing and body mechanics needed for daily activities. Baseline:  Goal status: IN PROGRESS  3.  Patient will demonstrate functional pain free lumbar ROM to perform ADLs.   Baseline:  Goal status: IN PROGRESS  4  Patient will demonstrate improved B LE strength to >/= 4+/5 for improved stability and ease of mobility . Baseline:  Goal status: IN PROGRESS  5.  Patient will report 72 on lumbar FOTO to demonstrate improved functional ability.  Baseline: 55 Goal status: IN PROGRESS    PLAN: PT FREQUENCY: 2x/week  PT DURATION: 6 weeks  PLANNED INTERVENTIONS: Therapeutic exercises, Therapeutic activity, Neuromuscular re-education, Balance training, Gait training, Patient/Family education, Joint mobilization, Dry Needling, Electrical stimulation, Cryotherapy, Moist heat, Taping, Ultrasound, Ionotophoresis 43m/ml Dexamethasone, Manual therapy, and Re-evaluation.  PLAN FOR NEXT SESSION: gently progress lumbopelvic flexibility and strengthening; review of posture and body mechanics education PRN; MT +/- DN as needed to address abnormal muscle tension and  tightness   JPercival Spanish PT 07/19/2022, 12:43 PM

## 2022-07-22 ENCOUNTER — Ambulatory Visit: Payer: Medicare Other

## 2022-07-22 ENCOUNTER — Other Ambulatory Visit: Payer: Self-pay | Admitting: Rheumatology

## 2022-07-22 DIAGNOSIS — M6281 Muscle weakness (generalized): Secondary | ICD-10-CM

## 2022-07-22 DIAGNOSIS — R29898 Other symptoms and signs involving the musculoskeletal system: Secondary | ICD-10-CM

## 2022-07-22 DIAGNOSIS — M0579 Rheumatoid arthritis with rheumatoid factor of multiple sites without organ or systems involvement: Secondary | ICD-10-CM

## 2022-07-22 DIAGNOSIS — M62830 Muscle spasm of back: Secondary | ICD-10-CM

## 2022-07-22 NOTE — Telephone Encounter (Signed)
Patient aware to contact the patient assistance program to set up shipment of medication.

## 2022-07-22 NOTE — Therapy (Signed)
OUTPATIENT PHYSICAL THERAPY TREATMENT   Patient Name: Victoria Sharp MRN: [id number] DOB:11/10/1941, 81 y.o., female Today's Date: 07/22/2022   PT End of Session - 07/22/22 0848     Visit Number 6    Number of Visits 13    Date for PT Re-Evaluation 08/19/22    Authorization Type Medicare & [employer] BCBS    PT Start Time 0845    PT Stop Time 0931    PT Time Calculation (min) 46 min    Activity Tolerance Patient tolerated treatment well    Behavior During Therapy [facility] for tasks assessed/performed                  Past Medical History:  Diagnosis Date   Cancer ([place])    Dermatitis    History of breast cancer    Hypertension    Migraines    PONV (postoperative nausea and vomiting)    Rheumatoid arthritis ([place])    Tachycardia    TIA (transient ischemic attack) 01/2020   questionable TIA vs migraine per neurologist   Past Surgical History:  Procedure Laterality Date   ABDOMINAL HYSTERECTOMY     ABLATION  08/2021   Dr. Ernestina Patches   BREAST LUMPECTOMY Left 1998   NEUROMA SURGERY Right 03/2020   TRANSFORAMINAL LUMBAR INTERBODY FUSION W/ MIS 1 LEVEL N/A 03/07/2022   Procedure: LUMBAR FOUR- FIVE,  MINIMALLY  INVASIVE TRANSFORAMINAL LUMBAR  INTERBODY FUSION  w/ METRIX;  Surgeon: Judith Part, MD;  Location: [facility] [place];  Service: Neurosurgery;  Laterality: N/A;  3C/RM 21   WEDGE RESECTION     Patient Active Problem List   Diagnosis Date Noted   Spondylolisthesis of lumbar region 03/07/2022   TIA (transient ischemic attack) 01/27/2021   Rheumatoid arthritis with rheumatoid factor of multiple sites without organ or systems involvement ([place]) 06/26/2020   Essential hypertension 06/26/2020   MVP (mitral valve prolapse) 06/26/2020   History of Barrett's esophagus 06/26/2020   History of gastroesophageal reflux (GERD) 06/26/2020   History of breast cancer 06/26/2020   High risk medication use 06/26/2020    PCP: [name]., MD  REFERRING PROVIDER: Judith Part, MD  REFERRING DIAG: M43.16 (ICD-10-CM) - Spondylolisthesis, lumbar region  RATIONALE FOR EVALUATION AND TREATMENT: Rehabilitation  THERAPY DIAG:  Muscle weakness (generalized)  Other symptoms and signs involving the musculoskeletal system  Muscle spasm of back  ONSET DATE: 03/07/22 - L4-5 Minimally invasive transforaminal lumbar interbody fusion w/ Metrix  SUBJECTIVE:  SUBJECTIVE STATEMENT: Pt reports that her she feels like her muscles are building up and she feels much better since starting PT.   PAIN:  Are you having pain? No  PERTINENT HISTORY:  L4-5 Minimally invasive transforaminal lumbar interbody fusion w/ Metrix - 03/07/22; HTN; TIA; migraines; breast cancer; RA; tachycardia; MVP; GERD  PRECAUTIONS: None  WEIGHT BEARING RESTRICTIONS No  FALLS:  Has patient fallen in last 6 months? No  LIVING ENVIRONMENT: Lives with: lives with their spouse Lives in: House/apartment Stairs: Yes: Internal: 14 steps; on left going up - to bonus room and bath (does not go up regularly) Has following equipment at home:  3-wheel rollator  OCCUPATION: Retired  PLOF: [place]: "To get back into exercising safely for my back."   OBJECTIVE:   DIAGNOSTIC FINDINGS:  10/09/21 - Lumbar MRI: 1. At L4-L5, moderate canal stenosis with bilateral subarticular recess narrowing and moderate right foraminal stenosis. Grade 1 anterolisthesis at this level.  2. At L3-L4, mild canal and right foraminal stenosis.  PATIENT SURVEYS:  FOTO Lumbar = 55; predicted = 62  SCREENING FOR RED FLAGS: Bowel or bladder incontinence: No Spinal tumors: No Cauda equina syndrome: No Compression fracture: No Abdominal aneurysm: No  COGNITION:  Overall cognitive status: Within functional limits for  tasks assessed     SENSATION: WFL  MUSCLE LENGTH: Hamstrings: mod tight B ITB: mod tight B Piriformis: mild/mod tight B Hip flexors: mod tight B Quads: mild/mod tight B Heelcord: mild tight R>L  POSTURE:  rounded shoulders and forward head  LUMBAR ROM:   Active  AROM  Eval - 07/05/22  Flexion Hands to mid shins - pulling  Extension 25% limited  Right lateral flexion Hand to fibular head - tight  Left lateral flexion Hand to fibular head - tight  Right rotation 25% limited  Left rotation 25% limited   (Blank rows = not tested)  LOWER EXTREMITY ROM:    WFL other than limitations due to muscle tightness  LOWER EXTREMITY MMT:    MMT Right Eval 07/05/22 Left Eval 07/05/22  Hip flexion 4- 4-  Hip extension 4 4-  Hip abduction 3+ 4-  Hip adduction 4 4  Hip internal rotation 4 4  Hip external rotation 4+ 4+  Knee flexion 4+ 4  Knee extension 5- 4+  Ankle dorsiflexion 4 4  Ankle plantarflexion 4+ 16 SLS HR 4+ 15 SLS HR  Ankle inversion    Ankle eversion     (Blank rows = not tested)    TODAY'S TREATMENT  07/22/22 Therapeutic Exercise: Nustep L6x67mn BATCA standing lat pull 15# x 20 BATCA standing shld ext x 15 10# Leg press 20# x 20 bil BATCA row 25# 2x10 BATCA knee flexion 25# 2x10 BATCA knee extension 20# 2x10 Standing pallof press blue TB x 10 bil Standing row with blue TB x 20 Multifidus walkout with blue TB x 10 Seated hamstring stretch x 30 bil Standing quad stretch with opp leg supported on chair x 30 sec  07/19/22 THERAPEUTIC EXERCISE: to improve flexibility, strength and mobility.  Verbal and tactile cues throughout for technique. NuStep - L6 x 6 min BATCA low row 20# 2 x 10 BATCA knee flexion 20# 2 x 10 BATCA knee extension 15# 2 x 10 BATCA leg press 20# 2 x 10 BATCA lat pull down 15# 2 x 10 - 1 set each seated and standing Standing GTB row x 10 Standing GTB scap retraction + shoulder extension to neutral x 10 Standing GTB  lat pull down x  10 Standing YTB B hip abduction, extension and flexion march x 10 each   07/15/22 Therapeutic Exercise: Nustep L5x43mn  Review of posture and body mechanics with handout provided Squats 20x at treadmill  Standing hip abduction, extension with yellow TB 10x each Sidesteps 2x18 ft, down/back yellow TB Multifidus walkout with green TB x 10 R/L Standing row with green TB x 10 Standing extension with green TB x 10 Seated piriformis stretch KTOS x 30 sec Seated figure 4 stretch x 30 sec   PATIENT EDUCATION:  Education details: HEP update - upper body theraband exercises, HEP modification - strengthening exercise frequency adjusted to 3x/wk (qod), and Use of gym machines for strengthening Person educated: Patient Education method: Explanation, Demonstration, Verbal cues, and Handouts Education comprehension: verbalized understanding, returned demonstration, verbal cues required, and needs further education   HOME EXERCISE PROGRAM: Access Code: [email or web address] URL: [email or web address] Date: 07/19/2022 Prepared by: JAnnie Paras Exercises - Seated Piriformis Stretch  - 2 x daily - 7 x weekly - 3 sets - 2 reps - 30 sec hold - Seated Piriformis Stretch with Trunk Bend  - 2 x daily - 7 x weekly - 3 sets - 2 reps - 30 sec hold - Supine Hamstring Stretch with Strap  - 2 x daily - 7 x weekly - 3 reps - 30 sec hold - Supine ITB Stretch with Strap  - 2 x daily - 7 x weekly - 3 reps - 30 sec hold - Supine Quadriceps Stretch with Strap on Table  - 2 x daily - 7 x weekly - 3 reps - 30 sec hold - Supine Lower Trunk Rotation  - 2 x daily - 7 x weekly - 5 reps - 10-15 sec hold - Supine Posterior Pelvic Tilt  - 2 x daily - 7 x weekly - 2 sets - 10 reps - 5 sec hold - Hooklying Clamshell with Resistance  - 1 x daily - 3 x weekly - 2 sets - 10 reps - 3-5 sec hold - Supine March with Resistance Band  - 1 x daily - 3 x weekly - 2 sets - 10 reps - 2-3 sec hold hold - Bridge with Resistance  - 1  x daily - 3 x weekly - 2 sets - 10 reps - 5 sec hold - Standing Hip Abduction with Resistance at Ankles and Counter Support  - 1 x daily - 3 x weekly - 2 sets - 10 reps - 3 sec hold - Standing Hip Extension with Resistance at Ankles and Counter Support  - 1 x daily - 3 x weekly - 2 sets - 10 reps - 3 sec hold - Marching with Resistance  - 1 x daily - 3 x weekly - 2 sets - 10 reps - 3 sec hold - Standing Bilateral Low Shoulder Row with Anchored Resistance  - 1 x daily - 3 x weekly - 2 sets - 10 reps - 5 sec hold - Scapular Retraction with Resistance Advanced  - 1 x daily - 3 x weekly - 2 sets - 10 reps - 5 sec hold - Standing Lat Pull Down with Resistance - Elbows Bent  - 1 x daily - 3 x weekly - 2 sets - 10 reps - 3 sec hold  Patient Education - Posture and Body Mechanics  ASSESSMENT:  CLINICAL IMPRESSION: Pt continues to note much improvement from PT. Required cues for the standing shld ext to keep elbow straight and pull toward  hips. Modified HEP replacing supine quad and hamstring stretch, with alternate versions for comfort. Pt demo good understanding of posture and body mechanics and denies concerns, so she has met LTG #2. Otherwise pt demonstrated a good response to treatment.  OBJECTIVE IMPAIRMENTS decreased activity tolerance, decreased mobility, decreased ROM, decreased strength, increased fascial restrictions, impaired perceived functional ability, increased muscle spasms, impaired flexibility, improper body mechanics, and postural dysfunction.   ACTIVITY LIMITATIONS carrying, lifting, bending, squatting, stairs, and caring for others  PARTICIPATION LIMITATIONS: meal prep, cleaning, laundry, shopping, community activity, and yard work  PERSONAL FACTORS Age, Fitness, Past/current experiences, Time since onset of injury/illness/exacerbation, and 3+ comorbidities: HTN; TIA; migraines; breast cancer; RA; tachycardia; MVP; GERD  are also affecting patient's functional outcome.   REHAB  POTENTIAL: Excellent  CLINICAL DECISION MAKING: Stable/uncomplicated  EVALUATION COMPLEXITY: Low   GOALS: Goals reviewed with patient? Yes  SHORT TERM GOALS: Target date: 07/26/2022   Patient will be independent with initial HEP.  Baseline:  Goal status: MET  07/12/22   LONG TERM GOALS: Target date: 08/16/2022    Patient will demonstrate independent use of ongoing/advanced HEP to facilitate ability to maintain/progress functional gains from skilled physical therapy services.  Baseline:  Goal status: IN PROGRESS  2.  Patient to demonstrate ability to achieve and maintain good spinal alignment/posturing and body mechanics needed for daily activities. Baseline:  Goal status: MET - 07/22/22 - demo and denies questions/concerns w/ handout provided  3.  Patient will demonstrate functional pain free lumbar ROM to perform ADLs.   Baseline:  Goal status: IN PROGRESS  4  Patient will demonstrate improved B LE strength to >/= 4+/5 for improved stability and ease of mobility . Baseline:  Goal status: IN PROGRESS  5.  Patient will report 31 on lumbar FOTO to demonstrate improved functional ability.  Baseline: 55 Goal status: IN PROGRESS    PLAN: PT FREQUENCY: 2x/week  PT DURATION: 6 weeks  PLANNED INTERVENTIONS: Therapeutic exercises, Therapeutic activity, Neuromuscular re-education, Balance training, Gait training, Patient/Family education, Joint mobilization, Dry Needling, Electrical stimulation, Cryotherapy, Moist heat, Taping, Ultrasound, Ionotophoresis 81m/ml Dexamethasone, Manual therapy, and Re-evaluation.  PLAN FOR NEXT SESSION: gently progress lumbopelvic flexibility and strengthening; review of posture and body mechanics education PRN; MT +/- DN as needed to address abnormal muscle tension and tightness   Makisha Marrin L Emmely Bittinger, PTA 07/22/2022, 9:51 AM

## 2022-07-22 NOTE — Telephone Encounter (Signed)
Patient called the office requesting a refill of Enbrel '50mg'$ /ml be sent to [facility]. Patient requests a call once it has been sent.

## 2022-07-22 NOTE — Telephone Encounter (Signed)
Next Visit: 09/27/2022  Last Visit: 04/18/2022  Last Fill: 05/26/2022  DX: Rheumatoid arthritis with rheumatoid factor of multiple sites without organ or systems involvement   Current Dose per office note 04/18/2022: - Enbrel 50 mg sq injections q 14 days   Labs: 06/06/2022, CBC and CMP WNL  TB Gold: 06/06/2022,  TB gold negative.   Okay to refill Enbrel?

## 2022-07-25 MED ORDER — ENBREL MINI 50 MG/ML ~~LOC~~ SOCT: SUBCUTANEOUS | 0 refills | Status: DC

## 2022-07-26 ENCOUNTER — Ambulatory Visit: Payer: Medicare Other | Attending: Neurological Surgery | Admitting: Physical Therapy

## 2022-07-26 ENCOUNTER — Encounter: Payer: Self-pay | Admitting: Physical Therapy

## 2022-07-26 DIAGNOSIS — R29898 Other symptoms and signs involving the musculoskeletal system: Secondary | ICD-10-CM | POA: Diagnosis present

## 2022-07-26 DIAGNOSIS — M62830 Muscle spasm of back: Secondary | ICD-10-CM | POA: Insufficient documentation

## 2022-07-26 DIAGNOSIS — M6281 Muscle weakness (generalized): Secondary | ICD-10-CM | POA: Diagnosis present

## 2022-07-26 NOTE — Therapy (Signed)
OUTPATIENT PHYSICAL THERAPY TREATMENT   Patient Name: Victoria Sharp MRN: [id number] DOB:[birth year], 81 y.o., female Today's Date: 07/26/2022   PT End of Session - 07/26/22 0849     Visit Number 7    Number of Visits 13    Date for PT Re-Evaluation 08/19/22    Authorization Type Medicare & An[employer] BCBS    PT Start Time [phone]    PT Stop Time 0931    PT Time Calculation (min) 42 min    Activity Tolerance Patient tolerated treatment well    Behavior During Therapy [facility] for tasks assessed/performed                   Past Medical History:  Diagnosis Date   Cancer ([place])    Dermatitis    History of breast cancer    Hypertension    Migraines    PONV (postoperative nausea and vomiting)    Rheumatoid arthritis ([place])    Tachycardia    TIA (transient ischemic attack) 01/2020   questionable TIA vs migraine per neurologist   Past Surgical History:  Procedure Laterality Date   ABDOMINAL HYSTERECTOMY     ABLATION  08/2021   Dr. Ernestina Patches   BREAST LUMPECTOMY Left 1998   NEUROMA SURGERY Right 03/2020   TRANSFORAMINAL LUMBAR INTERBODY FUSION W/ MIS 1 LEVEL N/A 03/07/2022   Procedure: LUMBAR FOUR- FIVE,  MINIMALLY  INVASIVE TRANSFORAMINAL LUMBAR  INTERBODY FUSION  w/ METRIX;  Surgeon: Judith Part, MD;  Location: [place];  Service: Neurosurgery;  Laterality: N/A;  3C/RM 21   WEDGE RESECTION     Patient Active Problem List   Diagnosis Date Noted   Spondylolisthesis of lumbar region 03/07/2022   TIA (transient ischemic attack) 01/27/2021   Rheumatoid arthritis with rheumatoid factor of multiple sites without organ or systems involvement ([place]) 06/26/2020   Essential hypertension 06/26/2020   MVP (mitral valve prolapse) 06/26/2020   History of Barrett's esophagus 06/26/2020   History of gastroesophageal reflux (GERD) 06/26/2020   History of breast cancer 06/26/2020   High risk medication use 06/26/2020    PCP: [name]., MD  REFERRING PROVIDER: Judith Part, MD  REFERRING DIAG: M43.16 (ICD-10-CM) - Spondylolisthesis, lumbar region  RATIONALE FOR EVALUATION AND TREATMENT: Rehabilitation  THERAPY DIAG:  Muscle weakness (generalized)  Other symptoms and signs involving the musculoskeletal system  Muscle spasm of back  ONSET DATE: 03/07/22 - L4-5 Minimally invasive transforaminal lumbar interbody fusion w/ Metrix  SUBJECTIVE:  SUBJECTIVE STATEMENT: Pt reports she is up to walking the whole neighborhood and a half (~35-40 minutes). She notes increased ease of ADLs such as putting her clothes, socks and shoes on.  PAIN:  Are you having pain? No  PERTINENT HISTORY:  L4-5 Minimally invasive transforaminal lumbar interbody fusion w/ Metrix - 03/07/22; HTN; TIA; migraines; breast cancer; RA; tachycardia; MVP; GERD  PRECAUTIONS: None  WEIGHT BEARING RESTRICTIONS No  FALLS:  Has patient fallen in last 6 months? No  LIVING ENVIRONMENT: Lives with: lives with their spouse Lives in: House/apartment Stairs: Yes: Internal: 14 steps; on left going up - to bonus room and bath (does not go up regularly) Has following equipment at home:  3-wheel rollator  OCCUPATION: Retired  PLOF: [place]: "To get back into exercising safely for my back."   OBJECTIVE:   DIAGNOSTIC FINDINGS:  10/09/21 - Lumbar MRI: 1. At L4-L5, moderate canal stenosis with bilateral subarticular recess narrowing and moderate right foraminal stenosis. Grade 1 anterolisthesis at this level.  2. At L3-L4, mild canal and right foraminal stenosis.  PATIENT SURVEYS:  FOTO Lumbar = 55; predicted = 62  SCREENING FOR RED FLAGS: Bowel or bladder incontinence: No Spinal tumors: No Cauda equina syndrome: No Compression fracture: No Abdominal aneurysm:  No  COGNITION:  Overall cognitive status: Within functional limits for tasks assessed     SENSATION: WFL  MUSCLE LENGTH: Hamstrings: mod tight B ITB: mod tight B Piriformis: mild/mod tight B Hip flexors: mod tight B Quads: mild/mod tight B Heelcord: mild tight R>L  POSTURE:  rounded shoulders and forward head  LUMBAR ROM:   Active  AROM  Eval - 07/05/22 AROM 07/26/22  Flexion Hands to mid shins - pulling WFL Fingertips to toes  Extension 25% limited WFL  Right lateral flexion Hand to fibular head - tight WNL  Left lateral flexion Hand to fibular head - tight WNL  Right rotation 25% limited WFL  Left rotation 25% limited WFL   (Blank rows = not tested)  LOWER EXTREMITY ROM:    WFL other than limitations due to muscle tightness  LOWER EXTREMITY MMT:    MMT Right Eval 07/05/22 Left Eval 07/05/22  Right 07/26/22  Left 07/26/22  Hip flexion 4- 4- 4+ 4+  Hip extension 4 4- 4+ 4+  Hip abduction 3+ 4- 4 4  Hip adduction 4 4 4+ 5  Hip internal rotation 4 4 4+ 4+  Hip external rotation 4+ 4+ 5 5  Knee flexion 4+ '4 5 5  ' Knee extension 5- 4+ 5 5  Ankle dorsiflexion '4 4 5 5  ' Ankle plantarflexion 4+ 16 SLS HR 4+ 15 SLS HR 5 20 SLS HR 5 20 SLS HR  Ankle inversion      Ankle eversion       (Blank rows = not tested)    TODAY'S TREATMENT   07/19/22 THERAPEUTIC EXERCISE: to improve flexibility, strength and mobility.  Verbal and tactile cues throughout for technique. NuStep - L6 x 6 min Bridge  Standing RTB B hip abduction, extension and flexion march x 10 each   07/22/22 Therapeutic Exercise: Nustep L6x2mn BATCA standing lat pull 15# x 20 BATCA standing shld ext x 15 10# Leg press 20# x 20 bil BATCA row 25# 2x10 BATCA knee flexion 25# 2x10 BATCA knee extension 20# 2x10 Standing pallof press blue TB x 10 bil Standing row with blue TB x 20 Multifidus walkout with blue TB x 10 Seated hamstring stretch x 30 bil Standing  quad stretch with opp leg supported on  chair x 30 sec  07/19/22 THERAPEUTIC EXERCISE: to improve flexibility, strength and mobility.  Verbal and tactile cues throughout for technique. NuStep - L6 x 6 min BATCA low row 20# 2 x 10 BATCA knee flexion 20# 2 x 10 BATCA knee extension 15# 2 x 10 BATCA leg press 20# 2 x 10 BATCA lat pull down 15# 2 x 10 - 1 set each seated and standing Standing GTB row x 10 Standing GTB scap retraction + shoulder extension to neutral x 10 Standing GTB lat pull down x 10 Standing YTB B hip abduction, extension and flexion march x 10 each   07/15/22 Therapeutic Exercise: Nustep L5x34mn  Review of posture and body mechanics with handout provided Squats 20x at treadmill  Standing hip abduction, extension with yellow TB 10x each Sidesteps 2x18 ft, down/back yellow TB Multifidus walkout with green TB x 10 R/L Standing row with green TB x 10 Standing extension with green TB x 10 Seated piriformis stretch KTOS x 30 sec Seated figure 4 stretch x 30 sec   PATIENT EDUCATION:  Education details: HEP update - upper body theraband exercises, HEP modification - strengthening exercise frequency adjusted to 3x/wk (qod), and Use of gym machines for strengthening Person educated: Patient Education method: Explanation, Demonstration, Verbal cues, and Handouts Education comprehension: verbalized understanding, returned demonstration, verbal cues required, and needs further education   HOME EXERCISE PROGRAM: Access Code: [email or web address] URL: [email or web address] Date: 07/26/2022 Prepared by: JAnnie Paras Exercises - Seated Piriformis Stretch  - 1 x daily - 3-4 x weekly - 3 sets - 2 reps - 30 sec hold - Seated Piriformis Stretch with Trunk Bend  - 1 x daily - 3-4 x weekly - 3 sets - 2 reps - 30 sec hold - Seated Hamstring Stretch  - 1 x daily - 3-4 x weekly - 3 sets - 2 reps - 30 sec hold - Standing Quad Stretch with Table and Chair Support  - 1 x daily - 3-4 x weekly - 3 sets - 2 reps - 30 sec  hold - Supine Lower Trunk Rotation  - 1 x daily - 3-4 x weekly - 5 reps - 10-15 sec hold - Bridge with Hip Abduction and Resistance  - 1 x daily - 3 x weekly - 2 sets - 10 reps - 5 sec hold - Supine March with Resistance Band  - 1 x daily - 3 x weekly - 2 sets - 10 reps - 2-3 sec hold hold - Standing Hip Abduction with Resistance at Ankles and Counter Support  - 1 x daily - 3 x weekly - 2 sets - 10 reps - 3 sec hold - Standing Hip Extension with Resistance at Ankles and Counter Support  - 1 x daily - 3 x weekly - 2 sets - 10 reps - 3 sec hold - Marching with Resistance  - 1 x daily - 3 x weekly - 2 sets - 10 reps - 3 sec hold - Standing Bilateral Low Shoulder Row with Anchored Resistance  - 1 x daily - 3 x weekly - 2 sets - 10 reps - 5 sec hold - Scapular Retraction with Resistance Advanced  - 1 x daily - 3 x weekly - 2 sets - 10 reps - 5 sec hold - Standing Lat Pull Down with Resistance - Elbows Bent  - 1 x daily - 3 x weekly - 2 sets - 10 reps - 3 sec hold  Patient Education - Biomedical scientist  ASSESSMENT:  CLINICAL IMPRESSION: Victoria Sharp is very pleased her progress with PT thus far, noting improving strength and ease of LB dressing. Lumbar ROM now WFL/WNL for all motions. LE strength improved for all LE muscle groups with only mild proximal weakness still evident. HEP reviewed and progressed, consolidating exercises and reducing frequencies while increasing resistance to further target remaining weakness. Victoria Sharp continues to demonstrate good progress toward her LTGs, with LTG #3 now met and LTG #4 nearly met. Given her current progress, anticipate she will be ready to transition to her HEP and gym program within the next few visits.  OBJECTIVE IMPAIRMENTS decreased activity tolerance, decreased mobility, decreased ROM, decreased strength, increased fascial restrictions, impaired perceived functional ability, increased muscle spasms, impaired flexibility, improper body mechanics, and  postural dysfunction.   ACTIVITY LIMITATIONS carrying, lifting, bending, squatting, stairs, and caring for others  PARTICIPATION LIMITATIONS: meal prep, cleaning, laundry, shopping, community activity, and yard work  PERSONAL FACTORS Age, Fitness, Past/current experiences, Time since onset of injury/illness/exacerbation, and 3+ comorbidities: HTN; TIA; migraines; breast cancer; RA; tachycardia; MVP; GERD  are also affecting patient's functional outcome.   REHAB POTENTIAL: Excellent  CLINICAL DECISION MAKING: Stable/uncomplicated  EVALUATION COMPLEXITY: Low   GOALS: Goals reviewed with patient? Yes  SHORT TERM GOALS: Target date: 07/26/2022   Patient will be independent with initial HEP.  Baseline:  Goal status: MET  07/12/22   LONG TERM GOALS: Target date: 08/16/2022    Patient will demonstrate independent use of ongoing/advanced HEP to facilitate ability to maintain/progress functional gains from skilled physical therapy services.  Baseline:  Goal status: IN PROGRESS  2.  Patient to demonstrate ability to achieve and maintain good spinal alignment/posturing and body mechanics needed for daily activities. Baseline:  Goal status: MET  07/22/22 - demo and denies questions/concerns w/ handout provided  3.  Patient will demonstrate functional pain free lumbar ROM to perform ADLs.   Baseline:  Goal status: MET  07/26/22  4  Patient will demonstrate improved B LE strength to >/= 4+/5 for improved stability and ease of mobility . Baseline:  Goal status: IN PROGRESS  07/26/22 - Met for all except B hip abduction  5.  Patient will report 36 on lumbar FOTO to demonstrate improved functional ability.  Baseline: 55 Goal status: IN PROGRESS    PLAN: PT FREQUENCY: 2x/week  PT DURATION: 6 weeks  PLANNED INTERVENTIONS: Therapeutic exercises, Therapeutic activity, Neuromuscular re-education, Balance training, Gait training, Patient/Family education, Joint mobilization, Dry Needling,  Electrical stimulation, Cryotherapy, Moist heat, Taping, Ultrasound, Ionotophoresis 8m/ml Dexamethasone, Manual therapy, and Re-evaluation.  PLAN FOR NEXT SESSION: gently progress lumbopelvic flexibility and strengthening; review of posture and body mechanics education PRN; MT +/- DN as needed to address abnormal muscle tension and tightness   JPercival Spanish PT 07/26/2022, 10:48 AM

## 2022-07-29 ENCOUNTER — Ambulatory Visit: Payer: Medicare Other

## 2022-07-29 DIAGNOSIS — M6281 Muscle weakness (generalized): Secondary | ICD-10-CM

## 2022-07-29 DIAGNOSIS — M62830 Muscle spasm of back: Secondary | ICD-10-CM

## 2022-07-29 DIAGNOSIS — R29898 Other symptoms and signs involving the musculoskeletal system: Secondary | ICD-10-CM

## 2022-07-29 NOTE — Therapy (Signed)
OUTPATIENT PHYSICAL THERAPY TREATMENT   Patient Name: Victoria Sharp MRN: [id number] DOB:July 28, 1941, 81 y.o., female Today's Date: 07/29/2022   PT End of Session - 07/29/22 0926     Visit Number 8    Number of Visits 13    Date for PT Re-Evaluation 08/19/22    Authorization Type Medicare & Anthem BCBS    PT Start Time [phone]    PT Stop Time 0929    PT Time Calculation (min) 43 min    Activity Tolerance Patient tolerated treatment well    Behavior During Therapy [facility] for tasks assessed/performed                    Past Medical History:  Diagnosis Date   Cancer ([place])    Dermatitis    History of breast cancer    Hypertension    Migraines    PONV (postoperative nausea and vomiting)    Rheumatoid arthritis ([place])    Tachycardia    TIA (transient ischemic attack) 01/2020   questionable TIA vs migraine per neurologist   Past Surgical History:  Procedure Laterality Date   ABDOMINAL HYSTERECTOMY     ABLATION  08/2021   Dr. Ernestina Patches   BREAST LUMPECTOMY Left 1998   NEUROMA SURGERY Right 03/2020   TRANSFORAMINAL LUMBAR INTERBODY FUSION W/ MIS 1 LEVEL N/A 03/07/2022   Procedure: LUMBAR FOUR- FIVE,  MINIMALLY  INVASIVE TRANSFORAMINAL LUMBAR  INTERBODY FUSION  w/ METRIX;  Surgeon: Judith Part, MD;  Location: [place];  Service: Neurosurgery;  Laterality: N/A;  3C/RM 21   WEDGE RESECTION     Patient Active Problem List   Diagnosis Date Noted   Spondylolisthesis of lumbar region 03/07/2022   TIA (transient ischemic attack) 01/27/2021   Rheumatoid arthritis with rheumatoid factor of multiple sites without organ or systems involvement ([place]) 06/26/2020   Essential hypertension 06/26/2020   MVP (mitral valve prolapse) 06/26/2020   History of Barrett's esophagus 06/26/2020   History of gastroesophageal reflux (GERD) 06/26/2020   History of breast cancer 06/26/2020   High risk medication use 06/26/2020    PCP: [name]., MD  REFERRING PROVIDER: Judith Part, MD  REFERRING DIAG: M43.16 (ICD-10-CM) - Spondylolisthesis, lumbar region  RATIONALE FOR EVALUATION AND TREATMENT: Rehabilitation  THERAPY DIAG:  Muscle weakness (generalized)  Other symptoms and signs involving the musculoskeletal system  Muscle spasm of back  ONSET DATE: 03/07/22 - L4-5 Minimally invasive transforaminal lumbar interbody fusion w/ Metrix  SUBJECTIVE:  SUBJECTIVE STATEMENT: A little sore today along R glute.  PAIN:  Are you having pain? No - soreness along glutes  PERTINENT HISTORY:  L4-5 Minimally invasive transforaminal lumbar interbody fusion w/ Metrix - 03/07/22; HTN; TIA; migraines; breast cancer; RA; tachycardia; MVP; GERD  PRECAUTIONS: None  WEIGHT BEARING RESTRICTIONS No  FALLS:  Has patient fallen in last 6 months? No  LIVING ENVIRONMENT: Lives with: lives with their spouse Lives in: House/apartment Stairs: Yes: Internal: 14 steps; on left going up - to bonus room and bath (does not go up regularly) Has following equipment at home:  3-wheel rollator  OCCUPATION: Retired  PLOF: [place]: "To get back into exercising safely for my back."   OBJECTIVE:   DIAGNOSTIC FINDINGS:  10/09/21 - Lumbar MRI: 1. At L4-L5, moderate canal stenosis with bilateral subarticular recess narrowing and moderate right foraminal stenosis. Grade 1 anterolisthesis at this level.  2. At L3-L4, mild canal and right foraminal stenosis.  PATIENT SURVEYS:  FOTO Lumbar = 55; predicted = 62  SCREENING FOR RED FLAGS: Bowel or bladder incontinence: No Spinal tumors: No Cauda equina syndrome: No Compression fracture: No Abdominal aneurysm: No  COGNITION:  Overall cognitive status: Within functional limits for tasks assessed     SENSATION: WFL  MUSCLE  LENGTH: Hamstrings: mod tight B ITB: mod tight B Piriformis: mild/mod tight B Hip flexors: mod tight B Quads: mild/mod tight B Heelcord: mild tight R>L  POSTURE:  rounded shoulders and forward head  LUMBAR ROM:   Active  AROM  Eval - 07/05/22 AROM 07/26/22  Flexion Hands to mid shins - pulling WFL Fingertips to toes  Extension 25% limited WFL  Right lateral flexion Hand to fibular head - tight WNL  Left lateral flexion Hand to fibular head - tight WNL  Right rotation 25% limited WFL  Left rotation 25% limited WFL   (Blank rows = not tested)  LOWER EXTREMITY ROM:    WFL other than limitations due to muscle tightness  LOWER EXTREMITY MMT:    MMT Right Eval 07/05/22 Left Eval 07/05/22  Right 07/26/22  Left 07/26/22  Hip flexion 4- 4- 4+ 4+  Hip extension 4 4- 4+ 4+  Hip abduction 3+ 4- 4 4  Hip adduction 4 4 4+ 5  Hip internal rotation 4 4 4+ 4+  Hip external rotation 4+ 4+ 5 5  Knee flexion 4+ '4 5 5  ' Knee extension 5- 4+ 5 5  Ankle dorsiflexion '4 4 5 5  ' Ankle plantarflexion 4+ 16 SLS HR 4+ 15 SLS HR 5 20 SLS HR 5 20 SLS HR  Ankle inversion      Ankle eversion       (Blank rows = not tested)    TODAY'S TREATMENT  07/29/22 Therapeutic Exercise: Bike L1x25mn Mod pigeon stretch 2x30" each side Mod KTOS stretch 2x15" in sitting Quadruped glute kicks x 10 - difficulty maintaining nuetral spine with kicking on L side Side lunge x 10 at counter Fwd lunges x 10 at counter Side step 4x161fwith red TB at ankles Figure 4 stretch in supine 2x30"  Manual Therapy: STM and IASTM w/ foam roll to R glutes, lumbar paraspinals, hamstrings  07/19/22 THERAPEUTIC EXERCISE: to improve flexibility, strength and mobility.  Verbal and tactile cues throughout for technique. NuStep - L6 x 6 min Bridge  Standing RTB B hip abduction, extension and flexion march x 10 each   07/22/22 Therapeutic Exercise: Nustep L6x6m36mBATCA standing lat pull 15# x 20 BATCA standing  shld ext x  15 10# Leg press 20# x 20 bil BATCA row 25# 2x10 BATCA knee flexion 25# 2x10 BATCA knee extension 20# 2x10 Standing pallof press blue TB x 10 bil Standing row with blue TB x 20 Multifidus walkout with blue TB x 10 Seated hamstring stretch x 30 bil Standing quad stretch with opp leg supported on chair x 30 sec     PATIENT EDUCATION:  Education details: HEP update - upper body theraband exercises, HEP modification - strengthening exercise frequency adjusted to 3x/wk (qod), and Use of gym machines for strengthening Person educated: Patient Education method: Explanation, Demonstration, Verbal cues, and Handouts Education comprehension: verbalized understanding, returned demonstration, verbal cues required, and needs further education   HOME EXERCISE PROGRAM: Access Code: [email or web address] URL: [email or web address] Date: 07/26/2022 Prepared by: Annie Paras  Exercises - Seated Piriformis Stretch  - 1 x daily - 3-4 x weekly - 3 sets - 2 reps - 30 sec hold - Seated Piriformis Stretch with Trunk Bend  - 1 x daily - 3-4 x weekly - 3 sets - 2 reps - 30 sec hold - Seated Hamstring Stretch  - 1 x daily - 3-4 x weekly - 3 sets - 2 reps - 30 sec hold - Standing Quad Stretch with Table and Chair Support  - 1 x daily - 3-4 x weekly - 3 sets - 2 reps - 30 sec hold - Supine Lower Trunk Rotation  - 1 x daily - 3-4 x weekly - 5 reps - 10-15 sec hold - Bridge with Hip Abduction and Resistance  - 1 x daily - 3 x weekly - 2 sets - 10 reps - 5 sec hold - Supine March with Resistance Band  - 1 x daily - 3 x weekly - 2 sets - 10 reps - 2-3 sec hold hold - Standing Hip Abduction with Resistance at Ankles and Counter Support  - 1 x daily - 3 x weekly - 2 sets - 10 reps - 3 sec hold - Standing Hip Extension with Resistance at Ankles and Counter Support  - 1 x daily - 3 x weekly - 2 sets - 10 reps - 3 sec hold - Marching with Resistance  - 1 x daily - 3 x weekly - 2 sets - 10 reps - 3 sec hold -  Standing Bilateral Low Shoulder Row with Anchored Resistance  - 1 x daily - 3 x weekly - 2 sets - 10 reps - 5 sec hold - Scapular Retraction with Resistance Advanced  - 1 x daily - 3 x weekly - 2 sets - 10 reps - 5 sec hold - Standing Lat Pull Down with Resistance - Elbows Bent  - 1 x daily - 3 x weekly - 2 sets - 10 reps - 3 sec hold  Patient Education - Posture and Body Mechanics  ASSESSMENT:  CLINICAL IMPRESSION: Pt demonstrated a good tolerance for exercises, as we progressed single leg strengthening. Cues were required throughout session to promote proper form and muscle facilitation. D/t reporting soreness along R glute med finished session with MT to decrease soreness and increase tissue pliability.  OBJECTIVE IMPAIRMENTS decreased activity tolerance, decreased mobility, decreased ROM, decreased strength, increased fascial restrictions, impaired perceived functional ability, increased muscle spasms, impaired flexibility, improper body mechanics, and postural dysfunction.   ACTIVITY LIMITATIONS carrying, lifting, bending, squatting, stairs, and caring for others  PARTICIPATION LIMITATIONS: meal prep, cleaning, laundry, shopping, community activity, and yard work  PERSONAL FACTORS Age, Fitness, Past/current experiences, Time  since onset of injury/illness/exacerbation, and 3+ comorbidities: HTN; TIA; migraines; breast cancer; RA; tachycardia; MVP; GERD  are also affecting patient's functional outcome.   REHAB POTENTIAL: Excellent  CLINICAL DECISION MAKING: Stable/uncomplicated  EVALUATION COMPLEXITY: Low   GOALS: Goals reviewed with patient? Yes  SHORT TERM GOALS: Target date: 07/26/2022   Patient will be independent with initial HEP.  Baseline:  Goal status: MET  07/12/22   LONG TERM GOALS: Target date: 08/16/2022    Patient will demonstrate independent use of ongoing/advanced HEP to facilitate ability to maintain/progress functional gains from skilled physical therapy  services.  Baseline:  Goal status: IN PROGRESS  2.  Patient to demonstrate ability to achieve and maintain good spinal alignment/posturing and body mechanics needed for daily activities. Baseline:  Goal status: MET  07/22/22 - demo and denies questions/concerns w/ handout provided  3.  Patient will demonstrate functional pain free lumbar ROM to perform ADLs.   Baseline:  Goal status: MET  07/26/22  4  Patient will demonstrate improved B LE strength to >/= 4+/5 for improved stability and ease of mobility . Baseline:  Goal status: IN PROGRESS  07/26/22 - Met for all except B hip abduction  5.  Patient will report 9 on lumbar FOTO to demonstrate improved functional ability.  Baseline: 55 Goal status: IN PROGRESS    PLAN: PT FREQUENCY: 2x/week  PT DURATION: 6 weeks  PLANNED INTERVENTIONS: Therapeutic exercises, Therapeutic activity, Neuromuscular re-education, Balance training, Gait training, Patient/Family education, Joint mobilization, Dry Needling, Electrical stimulation, Cryotherapy, Moist heat, Taping, Ultrasound, Ionotophoresis 3m/ml Dexamethasone, Manual therapy, and Re-evaluation.  PLAN FOR NEXT SESSION: gently progress lumbopelvic flexibility and strengthening; review of posture and body mechanics education PRN; MT +/- DN as needed to address abnormal muscle tension and tightness   [place] Creswell L Richerd Grime, PTA 07/29/2022, 9:30 AM

## 2022-08-02 ENCOUNTER — Ambulatory Visit: Payer: Medicare Other

## 2022-08-02 DIAGNOSIS — M62830 Muscle spasm of back: Secondary | ICD-10-CM

## 2022-08-02 DIAGNOSIS — M6281 Muscle weakness (generalized): Secondary | ICD-10-CM | POA: Diagnosis not present

## 2022-08-02 DIAGNOSIS — R29898 Other symptoms and signs involving the musculoskeletal system: Secondary | ICD-10-CM

## 2022-08-02 NOTE — Therapy (Signed)
OUTPATIENT PHYSICAL THERAPY TREATMENT   Patient Name: Victoria Sharp MRN: [id number] DOB:1941-09-03, 81 y.o., female Today's Date: 08/02/2022   PT End of Session - 08/02/22 0851     Visit Number 9    Number of Visits 13    Date for PT Re-Evaluation 08/19/22    Authorization Type Medicare & [employer] BCBS    PT Start Time 0845    PT Stop Time 0928    PT Time Calculation (min) 43 min    Activity Tolerance Patient tolerated treatment well    Behavior During Therapy [facility] for tasks assessed/performed                     Past Medical History:  Diagnosis Date   Cancer ([place])    Dermatitis    History of breast cancer    Hypertension    Migraines    PONV (postoperative nausea and vomiting)    Rheumatoid arthritis ([place])    Tachycardia    TIA (transient ischemic attack) 01/2020   questionable TIA vs migraine per neurologist   Past Surgical History:  Procedure Laterality Date   ABDOMINAL HYSTERECTOMY     ABLATION  08/2021   Dr. Ernestina Patches   BREAST LUMPECTOMY Left 1998   NEUROMA SURGERY Right 03/2020   TRANSFORAMINAL LUMBAR INTERBODY FUSION W/ MIS 1 LEVEL N/A 03/07/2022   Procedure: LUMBAR FOUR- FIVE,  MINIMALLY  INVASIVE TRANSFORAMINAL LUMBAR  INTERBODY FUSION  w/ METRIX;  Surgeon: Judith Part, MD;  Location: [facility];  Service: Neurosurgery;  Laterality: N/A;  3C/RM 21   WEDGE RESECTION     Patient Active Problem List   Diagnosis Date Noted   Spondylolisthesis of lumbar region 03/07/2022   TIA (transient ischemic attack) 01/27/2021   Rheumatoid arthritis with rheumatoid factor of multiple sites without organ or systems involvement ([place]) 06/26/2020   Essential hypertension 06/26/2020   MVP (mitral valve prolapse) 06/26/2020   History of Barrett's esophagus 06/26/2020   History of gastroesophageal reflux (GERD) 06/26/2020   History of breast cancer 06/26/2020   High risk medication use 06/26/2020    PCP: [name]., MD  REFERRING PROVIDER:  Judith Part, MD  REFERRING DIAG: M43.16 (ICD-10-CM) - Spondylolisthesis, lumbar region  RATIONALE FOR EVALUATION AND TREATMENT: Rehabilitation  THERAPY DIAG:  Muscle weakness (generalized)  Other symptoms and signs involving the musculoskeletal system  Muscle spasm of back  ONSET DATE: 03/07/22 - L4-5 Minimally invasive transforaminal lumbar interbody fusion w/ Metrix  SUBJECTIVE:  SUBJECTIVE STATEMENT: Pt reports she is doing good today, was sore after last visit but better now.  PAIN:  Are you having pain? No   PERTINENT HISTORY:  L4-5 Minimally invasive transforaminal lumbar interbody fusion w/ Metrix - 03/07/22; HTN; TIA; migraines; breast cancer; RA; tachycardia; MVP; GERD  PRECAUTIONS: None  WEIGHT BEARING RESTRICTIONS No  FALLS:  Has patient fallen in last 6 months? No  LIVING ENVIRONMENT: Lives with: lives with their spouse Lives in: House/apartment Stairs: Yes: Internal: 14 steps; on left going up - to bonus room and bath (does not go up regularly) Has following equipment at home:  3-wheel rollator  OCCUPATION: Retired  PLOF: [place]: "To get back into exercising safely for my back."   OBJECTIVE:   DIAGNOSTIC FINDINGS:  10/09/21 - Lumbar MRI: 1. At L4-L5, moderate canal stenosis with bilateral subarticular recess narrowing and moderate right foraminal stenosis. Grade 1 anterolisthesis at this level.  2. At L3-L4, mild canal and right foraminal stenosis.  PATIENT SURVEYS:  FOTO Lumbar = 55; predicted = 62  SCREENING FOR RED FLAGS: Bowel or bladder incontinence: No Spinal tumors: No Cauda equina syndrome: No Compression fracture: No Abdominal aneurysm: No  COGNITION:  Overall cognitive status: Within functional limits for tasks  assessed     SENSATION: WFL  MUSCLE LENGTH: Hamstrings: mod tight B ITB: mod tight B Piriformis: mild/mod tight B Hip flexors: mod tight B Quads: mild/mod tight B Heelcord: mild tight R>L  POSTURE:  rounded shoulders and forward head  LUMBAR ROM:   Active  AROM  Eval - 07/05/22 AROM 07/26/22  Flexion Hands to mid shins - pulling WFL Fingertips to toes  Extension 25% limited WFL  Right lateral flexion Hand to fibular head - tight WNL  Left lateral flexion Hand to fibular head - tight WNL  Right rotation 25% limited WFL  Left rotation 25% limited WFL   (Blank rows = not tested)  LOWER EXTREMITY ROM:    WFL other than limitations due to muscle tightness  LOWER EXTREMITY MMT:    MMT Right Eval 07/05/22 Left Eval 07/05/22  Right 07/26/22  Left 07/26/22  Hip flexion 4- 4- 4+ 4+  Hip extension 4 4- 4+ 4+  Hip abduction 3+ 4- 4 4  Hip adduction 4 4 4+ 5  Hip internal rotation 4 4 4+ 4+  Hip external rotation 4+ 4+ 5 5  Knee flexion 4+ _0 Knee extension 5- 4+ 5 5  Ankle dorsiflexion _1 Ankle plantarflexion 4+ 16 SLS HR 4+ 15 SLS HR 5 20 SLS HR 5 20 SLS HR  Ankle inversion      Ankle eversion       (Blank rows = not tested)    TODAY'S TREATMENT  08/02/22 Therapeutic Exercise: Rec Bike L2x20mn Lateral step up on 6' step x 10 bil - cues to slow motion Sidesteps 2x20 ft w/ red TB at ankles and knees bent Side lunges w/ TRX x 10 Squats w/ TRX x 10 for 3 sec holds Seated LAQ w/ 3# weights x 10 bil Supine clam w/ blue TB x 10 w/ 3 sec hold Mod pigeon stretch sitting 2x30"  07/29/22 Therapeutic Exercise: Bike L1x653m Mod pigeon stretch 2x30" each side Mod KTOS stretch 2x15" in sitting Quadruped glute kicks x 10 - difficulty maintaining nuetral spine with kicking on L side Side lunge x 10 at counter Fwd lunges x 10 at counter Side step 4x1264fith red TB at  ankles Figure 4 stretch in supine 2x30"  Manual Therapy: STM and IASTM w/ foam roll to R  glutes, lumbar paraspinals, hamstrings  07/19/22 THERAPEUTIC EXERCISE: to improve flexibility, strength and mobility.  Verbal and tactile cues throughout for technique. NuStep - L6 x 6 min Bridge  Standing RTB B hip abduction, extension and flexion march x 10 each      PATIENT EDUCATION:  Education details: HEP update - upper body theraband exercises, HEP modification - strengthening exercise frequency adjusted to 3x/wk (qod), and Use of gym machines for strengthening Person educated: Patient Education method: Explanation, Demonstration, Verbal cues, and Handouts Education comprehension: verbalized understanding, returned demonstration, verbal cues required, and needs further education   HOME EXERCISE PROGRAM: Access Code: [email or web address] URL: [email or web address] Date: 07/26/2022 Prepared by: Annie Paras  Exercises - Seated Piriformis Stretch  - 1 x daily - 3-4 x weekly - 3 sets - 2 reps - 30 sec hold - Seated Piriformis Stretch with Trunk Bend  - 1 x daily - 3-4 x weekly - 3 sets - 2 reps - 30 sec hold - Seated Hamstring Stretch  - 1 x daily - 3-4 x weekly - 3 sets - 2 reps - 30 sec hold - Standing Quad Stretch with Table and Chair Support  - 1 x daily - 3-4 x weekly - 3 sets - 2 reps - 30 sec hold - Supine Lower Trunk Rotation  - 1 x daily - 3-4 x weekly - 5 reps - 10-15 sec hold - Bridge with Hip Abduction and Resistance  - 1 x daily - 3 x weekly - 2 sets - 10 reps - 5 sec hold - Supine March with Resistance Band  - 1 x daily - 3 x weekly - 2 sets - 10 reps - 2-3 sec hold hold - Standing Hip Abduction with Resistance at Ankles and Counter Support  - 1 x daily - 3 x weekly - 2 sets - 10 reps - 3 sec hold - Standing Hip Extension with Resistance at Ankles and Counter Support  - 1 x daily - 3 x weekly - 2 sets - 10 reps - 3 sec hold - Marching with Resistance  - 1 x daily - 3 x weekly - 2 sets - 10 reps - 3 sec hold - Standing Bilateral Low Shoulder Row with Anchored  Resistance  - 1 x daily - 3 x weekly - 2 sets - 10 reps - 5 sec hold - Scapular Retraction with Resistance Advanced  - 1 x daily - 3 x weekly - 2 sets - 10 reps - 5 sec hold - Standing Lat Pull Down with Resistance - Elbows Bent  - 1 x daily - 3 x weekly - 2 sets - 10 reps - 3 sec hold  Patient Education - Posture and Body Mechanics  ASSESSMENT:  CLINICAL IMPRESSION: Pt demonstrates a good response to treatment. Continued to focus on TE targeting ongoing glute med weakness. Cues w/ squats to avoid pulling on TRX and focus on using LE. Pt had no pain w/ any interventions. Concluded w/ stretching to glute med to reduce soreness and pain post exercise.  OBJECTIVE IMPAIRMENTS decreased activity tolerance, decreased mobility, decreased ROM, decreased strength, increased fascial restrictions, impaired perceived functional ability, increased muscle spasms, impaired flexibility, improper body mechanics, and postural dysfunction.   ACTIVITY LIMITATIONS carrying, lifting, bending, squatting, stairs, and caring for others  PARTICIPATION LIMITATIONS: meal prep, cleaning, laundry, shopping, community activity, and yard work  PERSONAL FACTORS Age, Fitness,  Past/current experiences, Time since onset of injury/illness/exacerbation, and 3+ comorbidities: HTN; TIA; migraines; breast cancer; RA; tachycardia; MVP; GERD  are also affecting patient's functional outcome.   REHAB POTENTIAL: Excellent  CLINICAL DECISION MAKING: Stable/uncomplicated  EVALUATION COMPLEXITY: Low   GOALS: Goals reviewed with patient? Yes  SHORT TERM GOALS: Target date: 07/26/2022   Patient will be independent with initial HEP.  Baseline:  Goal status: MET  07/12/22   LONG TERM GOALS: Target date: 08/16/2022    Patient will demonstrate independent use of ongoing/advanced HEP to facilitate ability to maintain/progress functional gains from skilled physical therapy services.  Baseline:  Goal status: IN PROGRESS  2.  Patient  to demonstrate ability to achieve and maintain good spinal alignment/posturing and body mechanics needed for daily activities. Baseline:  Goal status: MET  07/22/22 - demo and denies questions/concerns w/ handout provided  3.  Patient will demonstrate functional pain free lumbar ROM to perform ADLs.   Baseline:  Goal status: MET  07/26/22  4  Patient will demonstrate improved B LE strength to >/= 4+/5 for improved stability and ease of mobility . Baseline:  Goal status: IN PROGRESS  07/26/22 - Met for all except B hip abduction  5.  Patient will report 61 on lumbar FOTO to demonstrate improved functional ability.  Baseline: 55 Goal status: IN PROGRESS    PLAN: PT FREQUENCY: 2x/week  PT DURATION: 6 weeks  PLANNED INTERVENTIONS: Therapeutic exercises, Therapeutic activity, Neuromuscular re-education, Balance training, Gait training, Patient/Family education, Joint mobilization, Dry Needling, Electrical stimulation, Cryotherapy, Moist heat, Taping, Ultrasound, Ionotophoresis 59m/ml Dexamethasone, Manual therapy, and Re-evaluation.  PLAN FOR NEXT SESSION: gently progress lumbopelvic flexibility and strengthening; review of posture and body mechanics education PRN; MT +/- DN as needed to address abnormal muscle tension and tightness   Kirat Mezquita L Maryiah Olvey, PTA 08/02/2022, 9:33 AM

## 2022-08-05 ENCOUNTER — Encounter: Payer: Self-pay | Admitting: Physical Therapy

## 2022-08-05 ENCOUNTER — Ambulatory Visit: Payer: Medicare Other | Admitting: Physical Therapy

## 2022-08-05 DIAGNOSIS — M6281 Muscle weakness (generalized): Secondary | ICD-10-CM

## 2022-08-05 DIAGNOSIS — R29898 Other symptoms and signs involving the musculoskeletal system: Secondary | ICD-10-CM

## 2022-08-05 DIAGNOSIS — M62830 Muscle spasm of back: Secondary | ICD-10-CM

## 2022-08-05 NOTE — Therapy (Addendum)
OUTPATIENT PHYSICAL THERAPY TREATMENT / PROGRESS NOTE / DISCHARGE SUMMARY   Patient Name: Victoria Sharp MRN: [id number] DOB:Sep 20, 1941, 81 y.o., female Today's Date: 08/05/2022  Progress Note  Reporting Period 07/05/2022 to 08/05/2022  See note below for Objective Data and Assessment of Progress/Goals.       PT End of Session - 08/05/22 0847     Visit Number 10    Number of Visits 13    Date for PT Re-Evaluation 08/19/22    Authorization Type Medicare & [employer] BCBS    PT Start Time [phone]    PT Stop Time 0934    PT Time Calculation (min) 47 min    Activity Tolerance Patient tolerated treatment well    Behavior During Therapy [facility] for tasks assessed/performed                      Past Medical History:  Diagnosis Date   Cancer ([place])    Dermatitis    History of breast cancer    Hypertension    Migraines    PONV (postoperative nausea and vomiting)    Rheumatoid arthritis ([place])    Tachycardia    TIA (transient ischemic attack) 01/2020   questionable TIA vs migraine per neurologist   Past Surgical History:  Procedure Laterality Date   ABDOMINAL HYSTERECTOMY     ABLATION  08/2021   Dr. Ernestina Patches   BREAST LUMPECTOMY Left 1998   NEUROMA SURGERY Right 03/2020   TRANSFORAMINAL LUMBAR INTERBODY FUSION W/ MIS 1 LEVEL N/A 03/07/2022   Procedure: LUMBAR FOUR- FIVE,  MINIMALLY  INVASIVE TRANSFORAMINAL LUMBAR  INTERBODY FUSION  w/ METRIX;  Surgeon: Judith Part, MD;  Location: [facility];  Service: Neurosurgery;  Laterality: N/A;  3C/RM 21   WEDGE RESECTION     Patient Active Problem List   Diagnosis Date Noted   Spondylolisthesis of lumbar region 03/07/2022   TIA (transient ischemic attack) 01/27/2021   Rheumatoid arthritis with rheumatoid factor of multiple sites without organ or systems involvement ([place]) 06/26/2020   Essential hypertension 06/26/2020   MVP (mitral valve prolapse) 06/26/2020   History of Barrett's esophagus 06/26/2020   History of  gastroesophageal reflux (GERD) 06/26/2020   History of breast cancer 06/26/2020   High risk medication use 06/26/2020    PCP: [name]., MD  REFERRING PROVIDER: Judith Part, MD  REFERRING DIAG: M43.16 (ICD-10-CM) - Spondylolisthesis, lumbar region  RATIONALE FOR EVALUATION AND TREATMENT: Rehabilitation  THERAPY DIAG:  Muscle weakness (generalized)  Other symptoms and signs involving the musculoskeletal system  Muscle spasm of back  ONSET DATE: 03/07/22 - L4-5 Minimally invasive transforaminal lumbar interbody fusion w/ Metrix  SUBJECTIVE:  SUBJECTIVE STATEMENT: Pt reports still has occasional R-sided soreness but no pain recently. She notes improved ability to complete a deep squat.  PAIN:  Are you having pain? No   PERTINENT HISTORY:  L4-5 Minimally invasive transforaminal lumbar interbody fusion w/ Metrix - 03/07/22; HTN; TIA; migraines; breast cancer; RA; tachycardia; MVP; GERD  PRECAUTIONS: None  WEIGHT BEARING RESTRICTIONS No  FALLS:  Has patient fallen in last 6 months? No  LIVING ENVIRONMENT: Lives with: lives with their spouse Lives in: House/apartment Stairs: Yes: Internal: 14 steps; on left going up - to bonus room and bath (does not go up regularly) Has following equipment at home:  3-wheel rollator  OCCUPATION: Retired  PLOF: [place]: "To get back into exercising safely for my back."   OBJECTIVE:   DIAGNOSTIC FINDINGS:  10/09/21 - Lumbar MRI: 1. At L4-L5, moderate canal stenosis with bilateral subarticular recess narrowing and moderate right foraminal stenosis. Grade 1 anterolisthesis at this level.  2. At L3-L4, mild canal and right foraminal stenosis.  PATIENT SURVEYS:  FOTO Lumbar = 55; predicted = 62  SCREENING FOR RED  FLAGS: Bowel or bladder incontinence: No Spinal tumors: No Cauda equina syndrome: No Compression fracture: No Abdominal aneurysm: No  COGNITION:  Overall cognitive status: Within functional limits for tasks assessed     SENSATION: WFL  MUSCLE LENGTH: Hamstrings: mod tight B ITB: mod tight B Piriformis: mild/mod tight B Hip flexors: mod tight B Quads: mild/mod tight B Heelcord: mild tight R>L  POSTURE:  rounded shoulders and forward head  LUMBAR ROM:   Active  AROM  Eval - 07/05/22 AROM 07/26/22  Flexion Hands to mid shins - pulling WFL Fingertips to toes  Extension 25% limited WFL  Right lateral flexion Hand to fibular head - tight WNL  Left lateral flexion Hand to fibular head - tight WNL  Right rotation 25% limited WFL  Left rotation 25% limited WFL   (Blank rows = not tested)  LOWER EXTREMITY ROM:    WFL other than limitations due to muscle tightness  LOWER EXTREMITY MMT:    MMT Right Eval 07/05/22 Left Eval 07/05/22  Right 07/26/22  Left 07/26/22  Right 08/05/22  Left 08/05/22  Hip flexion 4- 4- 4+ 4+ 4+ 4+  Hip extension 4 4- 4+ 4+ 5 5  Hip abduction 3+ 4- 4 4 4+ 4+  Hip adduction 4 4 4+ 5 4+ 5  Hip internal rotation 4 4 4+ 4+ 5 5  Hip external rotation 4+ 4+ 5 5 5 5   Knee flexion 4+ 4 5 5     Knee extension 5- 4+ 5 5    Ankle dorsiflexion 4 4 5 5     Ankle plantarflexion 4+ 16 SLS HR 4+ 15 SLS HR 5 20 SLS HR 5 20 SLS HR    Ankle inversion        Ankle eversion         (Blank rows = not tested)    TODAY'S TREATMENT   08/05/22 THERAPEUTIC EXERCISE: to improve flexibility, strength and mobility.  Verbal and tactile cues throughout for technique. Rec bike L2 x 6 min B side-steps with looped RTB at ankles 2 x 10 ft Fwd/back monster walks with with looped RTB at ankles 4 x 10 ft  THERAPEUTIC ACTIVITIES: Lumbar FOTO = 70 MMT Goal assessment   08/02/22 Therapeutic Exercise: Rec Bike L2x25min Lateral step up on 6' step x 10 bil - cues to slow  motion Sidesteps 2x20 ft w/ red TB  at ankles and knees bent Side lunges w/ TRX x 10 Squats w/ TRX x 10 for 3 sec holds Seated LAQ w/ 3# weights x 10 bil Supine clam w/ blue TB x 10 w/ 3 sec hold Mod pigeon stretch sitting 2x30"   07/29/22 Therapeutic Exercise: Bike L1x2min Mod pigeon stretch 2x30" each side Mod KTOS stretch 2x15" in sitting Quadruped glute kicks x 10 - difficulty maintaining nuetral spine with kicking on L side Side lunge x 10 at counter Fwd lunges x 10 at counter Side step 4x38ft with red TB at ankles Figure 4 stretch in supine 2x30"  Manual Therapy: STM and IASTM w/ foam roll to R glutes, lumbar paraspinals, hamstrings   PATIENT EDUCATION:  Education details: recommended frequency for ongoing HEP at discharge to prevent loss of gains achieved with PT Person educated: Patient Education method: Explanation Education comprehension: verbalized understanding   HOME EXERCISE PROGRAM: Access Code: [email or web address] URL: [email or web address] Date: 08/05/2022 Prepared by: Annie Paras  Exercises - Seated Piriformis Stretch  - 1 x daily - 3-4 x weekly - 3 reps - 30 sec hold - Seated Piriformis Stretch with Trunk Bend  - 1 x daily - 3-4 x weekly - 3 reps - 30 sec hold - Seated Hamstring Stretch with Strap  - 1 x daily - 3-4 x weekly - 3 reps - 30 sec hold - Standing Quad Stretch with Table and Chair Support  - 1 x daily - 3-4 x weekly - 3 reps - 30 sec hold - Supine Lower Trunk Rotation  - 1 x daily - 3-4 x weekly - 5 reps - 10-15 sec hold - Bridge with Hip Abduction and Resistance  - 1 x daily - 3 x weekly - 2 sets - 10 reps - 5 sec hold - Supine March with Resistance Band  - 1 x daily - 3 x weekly - 2 sets - 10 reps - 2-3 sec hold hold - Standing Hip Abduction with Resistance at Ankles and Counter Support  - 1 x daily - 3 x weekly - 2 sets - 10 reps - 3 sec hold - Standing Hip Extension with Resistance at Ankles and Counter Support  - 1 x daily - 3 x weekly  - 2 sets - 10 reps - 3 sec hold - Marching with Resistance  - 1 x daily - 3 x weekly - 2 sets - 10 reps - 3 sec hold - Standing Bilateral Low Shoulder Row with Anchored Resistance  - 1 x daily - 3 x weekly - 2 sets - 10 reps - 5 sec hold - Scapular Retraction with Resistance Advanced  - 1 x daily - 3 x weekly - 2 sets - 10 reps - 5 sec hold - Standing Lat Pull Down with Resistance - Elbows Bent  - 1 x daily - 3 x weekly - 2 sets - 10 reps - 3 sec hold - Side Stepping with Resistance at Ankles and Counter Support  - 1 x daily - 3 x weekly - 2 sets - 10 reps - 3 sec hold - Forward and Backward Monster Walk with Resistance at Ankles and Counter Support  - 1 x daily - 3 x weekly - 2 sets - 10 reps - 3 sec hold - Glute Max Release With [employer] Against Wall  - 1-2 min hold  Patient Education - Biomedical scientist  ASSESSMENT:  CLINICAL IMPRESSION: Emelyn notes 90% improvement in pain and function since starting PT, noting improved ability  to do things such as deep squats w/o limitation from her back. Her current FOTO score of 70 has exceeded the predicted value of 62. She has made excellent gains with PT with lumbar ROM now WNL/WFL and overall B LE strength now 4+/5 to 5/5. She acknowledges better awareness of her posture and body mechanics and feels confident with her current HEP after review and update today. All goals now met and Quinesha feels ready to transition to her HEP but would like to remain on hold for 30 days in the event that issues arise that would necessitate a return to PT.  OBJECTIVE IMPAIRMENTS decreased activity tolerance, decreased mobility, decreased ROM, decreased strength, increased fascial restrictions, impaired perceived functional ability, increased muscle spasms, impaired flexibility, improper body mechanics, and postural dysfunction.   ACTIVITY LIMITATIONS carrying, lifting, bending, squatting, stairs, and caring for others  PARTICIPATION LIMITATIONS: meal  prep, cleaning, laundry, shopping, community activity, and yard work  PERSONAL FACTORS Age, Fitness, Past/current experiences, Time since onset of injury/illness/exacerbation, and 3+ comorbidities: HTN; TIA; migraines; breast cancer; RA; tachycardia; MVP; GERD  are also affecting patient's functional outcome.   REHAB POTENTIAL: Excellent  CLINICAL DECISION MAKING: Stable/uncomplicated  EVALUATION COMPLEXITY: Low   GOALS: Goals reviewed with patient? Yes  SHORT TERM GOALS: Target date: 07/26/2022   Patient will be independent with initial HEP.  Baseline:  Goal status: MET  07/12/22   LONG TERM GOALS: Target date: 08/16/2022    Patient will demonstrate independent use of ongoing/advanced HEP to facilitate ability to maintain/progress functional gains from skilled physical therapy services.  Baseline:  Goal status: MET  08/05/22  2.  Patient to demonstrate ability to achieve and maintain good spinal alignment/posturing and body mechanics needed for daily activities. Baseline:  Goal status: MET  07/22/22 - demo and denies questions/concerns w/ handout provided  3.  Patient will demonstrate functional pain free lumbar ROM to perform ADLs.   Baseline:  Goal status: MET  07/26/22  4  Patient will demonstrate improved B LE strength to >/= 4+/5 for improved stability and ease of mobility . Baseline:  Goal status: MET  08/05/22   5.  Patient will report 3 on lumbar FOTO to demonstrate improved functional ability.  Baseline: 55 Goal status: MET  08/05/22 - Lumbar FOTO = 70   PLAN: PT FREQUENCY: 2x/week  PT DURATION: 6 weeks  PLANNED INTERVENTIONS: Therapeutic exercises, Therapeutic activity, Neuromuscular re-education, Balance training, Gait training, Patient/Family education, Joint mobilization, Dry Needling, Electrical stimulation, Cryotherapy, Moist heat, Taping, Ultrasound, Ionotophoresis 4mg /ml Dexamethasone, Manual therapy, and Re-evaluation.  PLAN FOR NEXT SESSION: transition to  HEP + 30-day hold   Percival Spanish, PT 08/05/2022, 1:07 PM   PHYSICAL THERAPY DISCHARGE SUMMARY  Visits from Start of Care: 10  Current functional level related to goals / functional outcomes:   Refer to above clinical impression and goal assessment for status as of last visit on 08/05/2022. Patient was placed on hold for 30 days and has not needed to return to PT, therefore will proceed with discharge from PT for this episode.     Remaining deficits:   As above.   Education / Equipment:   HEP; Biomedical scientist   Patient agrees to discharge. Patient goals were met. Patient is being discharged due to meeting the stated rehab goals.  Percival Spanish, PT, MPT 09/13/22, 10:01 AM  [facility] [place], [place], [address] Phone: [phone]  Fax:  [phone]

## 2022-08-09 ENCOUNTER — Ambulatory Visit: Payer: Medicare Other | Admitting: Physical Therapy

## 2022-08-12 ENCOUNTER — Telehealth: Payer: Self-pay | Admitting: Rheumatology

## 2022-08-12 NOTE — Telephone Encounter (Signed)
Patient left a voicemail stating she received a shipment of Enbrel and only received 4 injections of he 8 she was expecting. Patient states she reached out to the shipping pharmacy and they told her to contact the office. Patient states they need a verbal for the remaining 4 injections.

## 2022-08-12 NOTE — Telephone Encounter (Signed)
I called [facility] and spoke with Elmo Putt who then transferred me to Jenny Reichmann, PharmD. John advised me that [employer] will only dispense a 84 day supply so that is why the entire 18ms could not be dispensed at one time. I provided a verbal order for the remaining 433m that were written in the original prescription and they will get that shipped out to the patient. I called patient and advised her of the conversation that I had with JoJenny ReichmannPatient verbalized understanding.

## 2022-09-02 ENCOUNTER — Telehealth: Payer: Self-pay | Admitting: Rheumatology

## 2022-09-02 DIAGNOSIS — Z79899 Other long term (current) drug therapy: Secondary | ICD-10-CM

## 2022-09-02 NOTE — Telephone Encounter (Signed)
Patient called stating she accidentally put her 3 Enbrel injections in the freezer.  Patient states she won't receive anymore until the end of the month.  Patient requested a return call to let her know if the office has any samples.  If not, does she still need labwork before her 10/3 appointment since she won't have the medication in her system.

## 2022-09-02 NOTE — Telephone Encounter (Signed)
Spoke with patient and advised she may come by the office Monday to pick up samples of Enbrel. Samples reserved in fridge for patient. Patient advised she is due to update labs and orders released.

## 2022-09-03 LAB — CBC WITH DIFFERENTIAL/PLATELET
Absolute Monocytes: 543 cells/uL (ref 200–950)
Basophils Absolute: 47 cells/uL (ref 0–200)
Basophils Relative: 0.8 %
Eosinophils Absolute: 448 cells/uL (ref 15–500)
Eosinophils Relative: 7.6 %
HCT: 40 % (ref 35.0–45.0)
Hemoglobin: 13.6 g/dL (ref 11.7–15.5)
Lymphs Abs: 1912 cells/uL (ref 850–3900)
MCH: 30.5 pg (ref 27.0–33.0)
MCHC: 34 g/dL (ref 32.0–36.0)
MCV: 89.7 fL (ref 80.0–100.0)
MPV: 12.3 fL (ref 7.5–12.5)
Monocytes Relative: 9.2 %
Neutro Abs: 2950 cells/uL (ref 1500–7800)
Neutrophils Relative %: 50 %
Platelets: 231 10*3/uL (ref 140–400)
RBC: 4.46 10*6/uL (ref 3.80–5.10)
RDW: 13 % (ref 11.0–15.0)
Total Lymphocyte: 32.4 %
WBC: 5.9 10*3/uL (ref 3.8–10.8)

## 2022-09-03 LAB — COMPLETE METABOLIC PANEL WITH GFR
AG Ratio: 1.6 (calc) (ref 1.0–2.5)
ALT: 12 U/L (ref 6–29)
AST: 18 U/L (ref 10–35)
Albumin: 4.1 g/dL (ref 3.6–5.1)
Alkaline phosphatase (APISO): 84 U/L (ref 37–153)
BUN/Creatinine Ratio: 22 (calc) (ref 6–22)
BUN: 23 mg/dL (ref 7–25)
CO2: 24 mmol/L (ref 20–32)
Calcium: 9.6 mg/dL (ref 8.6–10.4)
Chloride: 106 mmol/L (ref 98–110)
Creat: 1.03 mg/dL — ABNORMAL HIGH (ref 0.60–1.00)
Globulin: 2.6 g/dL (calc) (ref 1.9–3.7)
Glucose, Bld: 90 mg/dL (ref 65–99)
Potassium: 4 mmol/L (ref 3.5–5.3)
Sodium: 141 mmol/L (ref 135–146)
Total Bilirubin: 0.4 mg/dL (ref 0.2–1.2)
Total Protein: 6.7 g/dL (ref 6.1–8.1)
eGFR: 55 mL/min/{1.73_m2} — ABNORMAL LOW (ref >=60)

## 2022-09-05 NOTE — Progress Notes (Signed)
Creatinine is borderline elevated-1.03 and GFR is low-55.  Please advise the patient to avoid the use of NSAIDs.  CBC WNL.

## 2022-09-05 NOTE — Telephone Encounter (Signed)
Medication Samples have been provided to the patient.  Drug name: Enbrel Mini       Strength: 50 mg        Qty: 2  LOT: [id number]  Exp.Date: 08/25/2024  Dosing instructions: Inject 50 mg under skin every 14 days.

## 2022-09-13 NOTE — Progress Notes (Signed)
Office Visit Note  Patient: Victoria Sharp             Date of Birth: 1941-10-22           MRN: [id number]             PCP: [name]., MD Referring: [name].,* Visit Date: 09/27/2022 Occupation: '@GUAROCC'$ @  Subjective:  Medication management  History of Present Illness: Victoria Sharp is a 81 y.o. female with history of seropositive rheumatoid arthritis, osteoarthritis and degenerative disc disease.  She states that she underwent spinal fusion on March 07, 2022.  She had very good results from the surgery.  She has not had much discomfort.  She also did physical therapy which helped her overall.  She has been taking Enbrel on a regular basis since her surgery.  She is currently on Enbrel 50 mg every 14 days.  She denies any joint pain or joint swelling.  She states she was recently diagnosed with sinus infection and she is taking doxycycline.  She states her symptoms are getting better.  Her last general shot was a week ago.  Activities of Daily Living:  Patient reports morning stiffness for a few minutes.   Patient Denies nocturnal pain.  Difficulty dressing/grooming: Denies Difficulty climbing stairs: Denies Difficulty getting out of chair: Denies Difficulty using hands for taps, buttons, cutlery, and/or writing: Denies  Review of Systems  Constitutional:  Positive for fatigue.  HENT:  Negative for mouth sores and mouth dryness.   Eyes:  Positive for dryness.  Respiratory:  Negative for shortness of breath.   Cardiovascular:  Negative for chest pain and palpitations.  Gastrointestinal:  Positive for constipation. Negative for blood in stool and diarrhea.  Endocrine: Negative for increased urination.  Genitourinary:  Negative for involuntary urination.  Musculoskeletal:  Positive for morning stiffness. Negative for joint pain, gait problem, joint pain, joint swelling, myalgias, muscle weakness, muscle tenderness and myalgias.  Skin:  Negative for  color change, rash, hair loss and sensitivity to sunlight.  Allergic/Immunologic: Positive for susceptible to infections.  Neurological:  Negative for dizziness and headaches.  Hematological:  Negative for swollen glands.  Psychiatric/Behavioral:  Negative for depressed mood and sleep disturbance. The patient is not nervous/anxious.     PMFS History:  Patient Active Problem List   Diagnosis Date Noted   Spondylolisthesis of lumbar region 03/07/2022   TIA (transient ischemic attack) 01/27/2021   Rheumatoid arthritis with rheumatoid factor of multiple sites without organ or systems involvement (Victoria Sharp) 06/26/2020   Essential hypertension 06/26/2020   MVP (mitral valve prolapse) 06/26/2020   History of Barrett's esophagus 06/26/2020   History of gastroesophageal reflux (GERD) 06/26/2020   History of breast cancer 06/26/2020   High risk medication use 06/26/2020    Past Medical History:  Diagnosis Date   Cancer ([place])    Dermatitis    History of breast cancer    Hypertension    Migraines    PONV (postoperative nausea and vomiting)    Rheumatoid arthritis (HCC)    Tachycardia    TIA (transient ischemic attack) 01/2020   questionable TIA vs migraine per neurologist    Family History  Problem Relation Age of Onset   Heart attack Father    Kidney disease Brother    Cancer Brother    Past Surgical History:  Procedure Laterality Date   ABDOMINAL HYSTERECTOMY     ABLATION  08/2021   Dr. Ernestina Patches   BREAST LUMPECTOMY Left 1998  NEUROMA SURGERY Right 03/2020   TRANSFORAMINAL LUMBAR INTERBODY FUSION W/ MIS 1 LEVEL N/A 03/07/2022   Procedure: LUMBAR FOUR- FIVE,  MINIMALLY  INVASIVE TRANSFORAMINAL LUMBAR  INTERBODY FUSION  w/ METRIX;  Surgeon: Judith Part, MD;  Location: [place];  Service: Neurosurgery;  Laterality: N/A;  3C/RM 19   WEDGE RESECTION     Social History   Social History Narrative   Lives with husband in home   Right Handed   Drinks coffee for caffeine    Immunization History  Administered Date(s) Administered   PFIZER(Purple Top)SARS-COV-2 Vaccination 01/09/2020, 01/30/2020, 09/11/2020, 10/18/2021     Objective: Vital Signs: BP (!) 150/92 (BP Location: Left Arm, Patient Position: Sitting, Cuff Size: Normal)   Pulse 69   Resp 14   Ht '5\' 2"'$  (1.575 m)   Wt 152 lb 6.4 oz (69.1 kg)   BMI 27.87 kg/m    Physical Exam Vitals and nursing note reviewed.  Constitutional:      Appearance: She is well-developed.  HENT:     Head: Normocephalic and atraumatic.  Eyes:     Conjunctiva/sclera: Conjunctivae normal.  Cardiovascular:     Rate and Rhythm: Normal rate and regular rhythm.     Heart sounds: Normal heart sounds.  Pulmonary:     Effort: Pulmonary effort is normal.     Breath sounds: Normal breath sounds.  Abdominal:     General: Bowel sounds are normal.     Palpations: Abdomen is soft.  Musculoskeletal:     Cervical back: Normal range of motion.  Lymphadenopathy:     Cervical: No cervical adenopathy.  Skin:    General: Skin is warm and dry.     Capillary Refill: Capillary refill takes less than 2 seconds.  Neurological:     Mental Status: She is alert and oriented to person, place, and time.  Psychiatric:        Behavior: Behavior normal.      Musculoskeletal Exam: Cervical spine was in good range of motion.  She had limited range of motion of the lumbar spine without discomfort.  Shoulder joints, elbow joints, wrist joints, MCPs PIPs and DIPs were in good range of motion with no synovitis.  Hip joints and knee joints with good range of motion.  She had no tenderness over ankles or MTPs.  CDAI Exam: CDAI Score: -- Patient Global: 0 mm; Provider Global: 0 mm Swollen: --; Tender: -- Joint Exam 09/27/2022   No joint exam has been documented for this visit   There is currently no information documented on the homunculus. Go to the Rheumatology activity and complete the homunculus joint exam.  Investigation: No  additional findings.  Imaging: No results found.  Recent Labs: Lab Results  Component Value Date   WBC 5.9 09/02/2022   HGB 13.6 09/02/2022   PLT 231 09/02/2022   NA 141 09/02/2022   K 4.0 09/02/2022   CL 106 09/02/2022   CO2 24 09/02/2022   GLUCOSE 90 09/02/2022   BUN 23 09/02/2022   CREATININE 1.03 (H) 09/02/2022   BILITOT 0.4 09/02/2022   ALKPHOS 65 01/27/2021   AST 18 09/02/2022   ALT 12 09/02/2022   PROT 6.7 09/02/2022   ALBUMIN 4.3 01/27/2021   CALCIUM 9.6 09/02/2022   GFRAA 62 06/09/2021   QFTBGOLDPLUS NEGATIVE 06/06/2022    Speciality Comments: Methotrexate-diarrhea, Arava-dermatitis Enbrel 08/2020  Procedures:  No procedures performed Allergies: Contrast media [iodinated contrast media], Latex, Leflunomide, Methotrexate derivatives, Metronidazole, and Sulfamethoxazole-trimethoprim   Assessment /  Plan:     Visit Diagnoses: Rheumatoid arthritis with rheumatoid factor of multiple sites without organ or systems involvement ([place]) - Positive RF, +anti-CCP, +ANA, +synovitis: Patient had no synovitis on examination today.  She has been taking Enbrel 50 mg subcu every 14 days.  She states she stopped Enbrel prior to the surgery and restarted 2 weeks after the lumbar fusion in March.  She did not have a flare.  She has been taking Enbrel subcutaneous injections every 14 days since then.  She denies any history of joint swelling or joint pain.  She was diagnosed with sinus infection recently and is currently on doxycycline.  I advised her to hold Enbrel until the infection resolves.  High risk medication use -she is on Enbrel 50 mg sq injections q 14 days since 08/2021, Enbrel-started 09/24/20.  September 02, 2022 CBC was normal and CMP showed creatinine 1.03 which has been stable.  TB gold was negative on June 06, 2022.  Information regarding vaccination was placed in the AVS.  She was advised to hold Enbrel if she develops an infection and resume after the infection resolves.   Annual skin examination to screen for skin cancer was advised.  Use of sunscreen was advised.  Primary osteoarthritis of both hands-she has mild DIP and PIP thickening.  She denies any discomfort.  Primary osteoarthritis of both feet - [place]ions formation of bilateral great toes noted.  Proper fitting shoes were advised.  DDD (degenerative disc disease), lumbar - Status post a spinal fusion March 07, 2022.  She had good response to the surgery.  She had physical therapy which was also very useful.  Other secondary scoliosis, lumbar region  Positive ANA (antinuclear antibody) - She has no clinical features of systemic lupus.    Osteopenia of multiple sites - March 11, 2021 DEXA AP spine BMD 0.854 with T score -1.8.  Use of calcium rich diet and vitamin D was advised.  She has been walking up to 2 to 3 miles a day.  Vitamin D deficiency-she is vitamin D supplement.  Essential hypertension-blood sugar was elevated at 150/92.  She is on amlodipine.  I advised her to monitor blood pressure closely and follow-up with her PCP if her blood pressure stays elevated.  MVP (mitral valve prolapse)  History of gastroesophageal reflux (GERD)-she takes reflux precautions and is currently on Protonix.  History of breast cancer  History of Barrett's esophagus  Orders: No orders of the defined types were placed in this encounter.  No orders of the defined types were placed in this encounter.  .  Follow-Up Instructions: Return in about 5 months (around 02/26/2023) for Rheumatoid arthritis, Osteoarthritis.   Bo Merino, MD  Note - This record has been created using Editor, commissioning.  Chart creation errors have been sought, but may not always  have been located. Such creation errors do not reflect on  the standard of medical care.

## 2022-09-22 ENCOUNTER — Other Ambulatory Visit: Payer: Self-pay | Admitting: *Deleted

## 2022-09-22 DIAGNOSIS — M0579 Rheumatoid arthritis with rheumatoid factor of multiple sites without organ or systems involvement: Secondary | ICD-10-CM

## 2022-09-22 MED ORDER — ENBREL MINI 50 MG/ML ~~LOC~~ SOCT: SUBCUTANEOUS | 0 refills | Status: DC

## 2022-09-22 NOTE — Telephone Encounter (Signed)
Refill request received via fax from [facility] for Enbrel.   Next Visit: 09/27/2022   Last Visit: 04/18/2022   Last Fill: 07/25/2022   DX: Rheumatoid arthritis with rheumatoid factor of multiple sites without organ or systems involvement    Current Dose per office note 04/18/2022: - Enbrel 50 mg sq injections q 14 days    Labs: 09/02/2022 Creatinine is borderline elevated-1.03 and GFR is low-55. CBC WNL.    TB Gold: 06/06/2022,  TB gold negative.    Okay to refill Enbrel?

## 2022-09-27 ENCOUNTER — Encounter: Payer: Self-pay | Admitting: Rheumatology

## 2022-09-27 ENCOUNTER — Ambulatory Visit: Payer: Medicare Other | Attending: Rheumatology | Admitting: Rheumatology

## 2022-09-27 VITALS — BP 150/92 | HR 69 | Resp 14 | Ht 62.0 in | Wt 152.4 lb

## 2022-09-27 DIAGNOSIS — M8589 Other specified disorders of bone density and structure, multiple sites: Secondary | ICD-10-CM

## 2022-09-27 DIAGNOSIS — R768 Other specified abnormal immunological findings in serum: Secondary | ICD-10-CM | POA: Diagnosis present

## 2022-09-27 DIAGNOSIS — I341 Nonrheumatic mitral (valve) prolapse: Secondary | ICD-10-CM | POA: Diagnosis present

## 2022-09-27 DIAGNOSIS — M19042 Primary osteoarthritis, left hand: Secondary | ICD-10-CM

## 2022-09-27 DIAGNOSIS — M19071 Primary osteoarthritis, right ankle and foot: Secondary | ICD-10-CM | POA: Diagnosis present

## 2022-09-27 DIAGNOSIS — I10 Essential (primary) hypertension: Secondary | ICD-10-CM

## 2022-09-27 DIAGNOSIS — M19072 Primary osteoarthritis, left ankle and foot: Secondary | ICD-10-CM | POA: Diagnosis present

## 2022-09-27 DIAGNOSIS — M19041 Primary osteoarthritis, right hand: Secondary | ICD-10-CM | POA: Diagnosis present

## 2022-09-27 DIAGNOSIS — M0579 Rheumatoid arthritis with rheumatoid factor of multiple sites without organ or systems involvement: Secondary | ICD-10-CM

## 2022-09-27 DIAGNOSIS — Z8719 Personal history of other diseases of the digestive system: Secondary | ICD-10-CM | POA: Diagnosis present

## 2022-09-27 DIAGNOSIS — M4156 Other secondary scoliosis, lumbar region: Secondary | ICD-10-CM | POA: Diagnosis present

## 2022-09-27 DIAGNOSIS — E559 Vitamin D deficiency, unspecified: Secondary | ICD-10-CM | POA: Diagnosis present

## 2022-09-27 DIAGNOSIS — Z79899 Other long term (current) drug therapy: Secondary | ICD-10-CM | POA: Diagnosis present

## 2022-09-27 DIAGNOSIS — Z853 Personal history of malignant neoplasm of breast: Secondary | ICD-10-CM

## 2022-09-27 DIAGNOSIS — M5136 Other intervertebral disc degeneration, lumbar region: Secondary | ICD-10-CM | POA: Diagnosis present

## 2022-09-27 NOTE — Patient Instructions (Signed)
Standing Labs We placed an order today for your standing lab work.   Please have your standing labs drawn in December and every 3 months  Please have your labs drawn 2 weeks prior to your appointment so that the provider can discuss your lab results at your appointment.  Please note that you may see your imaging and lab results in [place] before we have reviewed them. We will contact you once all results are reviewed. Please allow our office up to 72 hours to thoroughly review all of the results before contacting the office for clarification of your results.  Lab hours are: Monday through Thursday from 1:30 pm-4:30 pm and Friday from 1:30 pm- 4:00 pm  You may experience shorter wait times on Monday, Thursday or Friday afternoons,.   Effective October 24, 2022, new lab hours will be: Monday through Thursday from 8:00 am -12:30 pm and 1:00 pm-5:00 pm and Friday from 8:00 am-12:00 pm.  Please be advised, all patients with office appointments requiring lab work will take precedent over walk-in lab work.   Labs are drawn by [facility]. Please bring your co-pay at the time of your lab draw.  You may receive a bill from [facility] for your lab work.  Please note if you are on Hydroxychloroquine and and an order has been placed for a Hydroxychloroquine level, you will need to have it drawn 4 hours or more after your last dose.  If you wish to have your labs drawn at another location, please call the office 24 hours in advance so we can fax the orders.  The office is located at [address], [place], [place], [place] [address] No appointment is necessary.    If you have any questions regarding directions or hours of operation,  please call [phone].   As a reminder, please drink plenty of water prior to coming for your lab work. Thanks!   Vaccines You are taking a medication(s) that can suppress your immune system.  The following immunizations are recommended: Flu annually Covid-19  Td/Tdap  (tetanus, diphtheria, pertussis) every 10 years Pneumonia (Prevnar 15 then Pneumovax 23 at least 1 year apart.  Alternatively, can take Prevnar 20 without needing additional dose) Shingrix: 2 doses from 4 weeks to 6 months apart  Please check with your PCP to make sure you are up to date.   If you have signs or symptoms of an infection or start antibiotics: First, call your PCP for workup of your infection. Hold your medication through the infection, until you complete your antibiotics, and until symptoms resolve if you take the following: Injectable medication (Actemra, Benlysta, Cimzia, Cosentyx, Enbrel, Humira, Kevzara, Orencia, Remicade, Simponi, Stelara, Taltz, Tremfya) Methotrexate Leflunomide (Arava) Mycophenolate (Cellcept) Morrie Sheldon, Olumiant, or Rinvoq    Please get an annual skin examination to screen for skin cancer while you are on Enbrel.

## 2022-11-30 ENCOUNTER — Telehealth: Payer: Self-pay | Admitting: Pharmacist

## 2022-11-30 NOTE — Telephone Encounter (Signed)
Patient dropped off Enbrel PAP renewal appication for [employer] program. Pending provider signature. Placed in Dr. Arlean Hopping folder ofr signature  Submitted a Prior Authorization renewal request to [place] for ENBREL via CoverMyMeds. Will update once we receive a response.  Key: [id number]  Knox Saliva, PharmD, MPH, BCPS, CPP Clinical Pharmacist (Rheumatology and Pulmonology)

## 2022-12-02 NOTE — Telephone Encounter (Signed)
Received notification from [facility] regarding a prior authorization for ENBREL. Authorization has been APPROVED from 10/31/22 to 11/30/2023.   Authorization # [id number]  NO PA approval attached or sent (as of yet) so will print CMM page and attached to PAP.  Submitted Patient Assistance RENEWAL Application to [employer] for ENBREL along with provider portion, patient portion, med list, insurance card copy, PA and income documents. Will update patient when we receive a response.  Fax# [phone] Phone# [phone]  Knox Saliva, PharmD, MPH, BCPS, CPP Clinical Pharmacist (Rheumatology and Pulmonology)

## 2022-12-08 ENCOUNTER — Other Ambulatory Visit: Payer: Self-pay | Admitting: Rheumatology

## 2022-12-08 DIAGNOSIS — M0579 Rheumatoid arthritis with rheumatoid factor of multiple sites without organ or systems involvement: Secondary | ICD-10-CM

## 2022-12-08 DIAGNOSIS — Z79899 Other long term (current) drug therapy: Secondary | ICD-10-CM

## 2022-12-08 MED ORDER — ENBREL MINI 50 MG/ML ~~LOC~~ SOCT: SUBCUTANEOUS | 0 refills | Status: DC

## 2022-12-08 NOTE — Telephone Encounter (Signed)
Patient called the office requesting a refill of Enbrel '50mg'$ /ml to be sent to [facility].

## 2022-12-08 NOTE — Telephone Encounter (Signed)
Next Visit: 03/01/2023  Last Visit: 09/27/2022  Last Fill: 09/22/2022  PZ:PSUGAYGEFU arthritis with rheumatoid factor of multiple sites without organ or systems involvement   Current Dose per office note 09/27/2022: Enbrel 50 mg sq injections q 14 days   Labs: 09/02/2022 Creatinine is borderline elevated-1.03 and GFR is low-55.  Please advise the patient to avoid the use of NSAIDs. CBC WNL.    TB Gold: 06/06/2022 Neg    Patient advised she is due to update labs. Patient will update tomorrow. Orders released.   Okay to refill Enbrel?

## 2022-12-09 ENCOUNTER — Telehealth: Payer: Self-pay | Admitting: Rheumatology

## 2022-12-09 NOTE — Telephone Encounter (Signed)
Patient called requesting her labwork orders be sent to [facility] in [facility].  Patient plans to go this morning.

## 2022-12-09 NOTE — Telephone Encounter (Signed)
Lab orders were released 12/08/2022.

## 2022-12-10 LAB — CBC WITH DIFFERENTIAL/PLATELET
Absolute Monocytes: 851 cells/uL (ref 200–950)
Basophils Absolute: 49 cells/uL (ref 0–200)
Basophils Relative: 0.6 %
Eosinophils Absolute: 251 cells/uL (ref 15–500)
Eosinophils Relative: 3.1 %
HCT: 43.1 % (ref 35.0–45.0)
Hemoglobin: 14.5 g/dL (ref 11.7–15.5)
Lymphs Abs: 1758 cells/uL (ref 850–3900)
MCH: 30.9 pg (ref 27.0–33.0)
MCHC: 33.6 g/dL (ref 32.0–36.0)
MCV: 91.9 fL (ref 80.0–100.0)
MPV: 12 fL (ref 7.5–12.5)
Monocytes Relative: 10.5 %
Neutro Abs: 5192 cells/uL (ref 1500–7800)
Neutrophils Relative %: 64.1 %
Platelets: 259 10*3/uL (ref 140–400)
RBC: 4.69 10*6/uL (ref 3.80–5.10)
RDW: 12.5 % (ref 11.0–15.0)
Total Lymphocyte: 21.7 %
WBC: 8.1 10*3/uL (ref 3.8–10.8)

## 2022-12-10 LAB — COMPLETE METABOLIC PANEL WITH GFR
AG Ratio: 1.6 (calc) (ref 1.0–2.5)
ALT: 9 U/L (ref 6–29)
AST: 16 U/L (ref 10–35)
Albumin: 4.2 g/dL (ref 3.6–5.1)
Alkaline phosphatase (APISO): 84 U/L (ref 37–153)
BUN/Creatinine Ratio: 19 (calc) (ref 6–22)
BUN: 19 mg/dL (ref 7–25)
CO2: 24 mmol/L (ref 20–32)
Calcium: 9.4 mg/dL (ref 8.6–10.4)
Chloride: 106 mmol/L (ref 98–110)
Creat: 1.01 mg/dL — ABNORMAL HIGH (ref 0.60–1.00)
Globulin: 2.7 g/dL (calc) (ref 1.9–3.7)
Glucose, Bld: 92 mg/dL (ref 65–99)
Potassium: 3.9 mmol/L (ref 3.5–5.3)
Sodium: 140 mmol/L (ref 135–146)
Total Bilirubin: 0.8 mg/dL (ref 0.2–1.2)
Total Protein: 6.9 g/dL (ref 6.1–8.1)
eGFR: 57 mL/min/{1.73_m2} — ABNORMAL LOW (ref >=60)

## 2022-12-12 NOTE — Progress Notes (Signed)
CBC WNL.  Creatinine is borderline elevated and GFR is slightly low-57-improved. Avoid the use of NSAIDs.  Rest of CMP WNL.

## 2022-12-23 NOTE — Telephone Encounter (Signed)
[place] for update on patient's Enbrel renewal application. Received a fax from  [facility] regarding an approval for ENBREL patient assistance from 12/26/22 to 12/26/2023.  Approval letter should be getting faxed to our clinic in the next few days.  Phone# [phone]  Knox Saliva, PharmD, MPH, BCPS, CPP Clinical Pharmacist (Rheumatology and Pulmonology)

## 2023-02-16 NOTE — Progress Notes (Deleted)
Office Visit Note  Patient: Victoria Sharp             Date of Birth: [birth year]           MRN: [id number]             PCP: [name]., MD Referring: [name].,* Visit Date: 03/01/2023 Occupation: @GUAROCC$ @  Subjective:  No chief complaint on file.   History of Present Illness: Victoria Sharp is a 82 y.o. female ***     Activities of Daily Living:  Patient reports morning stiffness for *** {minute/hour:19697}.   Patient {ACTIONS;DENIES/REPORTS:21021675::"Denies"} nocturnal pain.  Difficulty dressing/grooming: {ACTIONS;DENIES/REPORTS:21021675::"Denies"} Difficulty climbing stairs: {ACTIONS;DENIES/REPORTS:21021675::"Denies"} Difficulty getting out of chair: {ACTIONS;DENIES/REPORTS:21021675::"Denies"} Difficulty using hands for taps, buttons, cutlery, and/or writing: {ACTIONS;DENIES/REPORTS:21021675::"Denies"}  No Rheumatology ROS completed.   PMFS History:  Patient Active Problem List   Diagnosis Date Noted   Spondylolisthesis of lumbar region 03/07/2022   TIA (transient ischemic attack) 01/27/2021   Rheumatoid arthritis with rheumatoid factor of multiple sites without organ or systems involvement ([place]) 06/26/2020   Essential hypertension 06/26/2020   MVP (mitral valve prolapse) 06/26/2020   History of Barrett's esophagus 06/26/2020   History of gastroesophageal reflux (GERD) 06/26/2020   History of breast cancer 06/26/2020   High risk medication use 06/26/2020    Past Medical History:  Diagnosis Date   Cancer ([place])    Dermatitis    History of breast cancer    Hypertension    Migraines    PONV (postoperative nausea and vomiting)    Rheumatoid arthritis (HCC)    Tachycardia    TIA (transient ischemic attack) 01/2020   questionable TIA vs migraine per neurologist    Family History  Problem Relation Age of Onset   Heart attack Father    Kidney disease Brother    Cancer Brother    Past Surgical History:  Procedure Laterality  Date   ABDOMINAL HYSTERECTOMY     ABLATION  08/2021   Dr. Ernestina Patches   BREAST LUMPECTOMY Left 1998   NEUROMA SURGERY Right 03/2020   TRANSFORAMINAL LUMBAR INTERBODY FUSION W/ MIS 1 LEVEL N/A 03/07/2022   Procedure: LUMBAR FOUR- FIVE,  MINIMALLY  INVASIVE TRANSFORAMINAL LUMBAR  INTERBODY FUSION  w/ METRIX;  Surgeon: Judith Part, MD;  Location: [facility];  Service: Neurosurgery;  Laterality: N/A;  3C/RM 22   WEDGE RESECTION     Social History   Social History Narrative   Lives with husband in home   Right Handed   Drinks coffee for caffeine   Immunization History  Administered Date(s) Administered   PFIZER(Purple Top)SARS-COV-2 Vaccination 01/09/2020, 01/30/2020, 09/11/2020, 10/18/2021     Objective: Vital Signs: There were no vitals taken for this visit.   Physical Exam   Musculoskeletal Exam: ***  CDAI Exam: CDAI Score: -- Patient Global: --; Provider Global: -- Swollen: --; Tender: -- Joint Exam 03/01/2023   No joint exam has been documented for this visit   There is currently no information documented on the homunculus. Go to the Rheumatology activity and complete the homunculus joint exam.  Investigation: No additional findings.  Imaging: No results found.  Recent Labs: Lab Results  Component Value Date   WBC 8.1 12/09/2022   HGB 14.5 12/09/2022   PLT 259 12/09/2022   NA 140 12/09/2022   K 3.9 12/09/2022   CL 106 12/09/2022   CO2 24 12/09/2022   GLUCOSE 92 12/09/2022   BUN 19 12/09/2022   CREATININE 1.01 (H) 12/09/2022  BILITOT 0.8 12/09/2022   ALKPHOS 65 01/27/2021   AST 16 12/09/2022   ALT 9 12/09/2022   PROT 6.9 12/09/2022   ALBUMIN 4.3 01/27/2021   CALCIUM 9.4 12/09/2022   GFRAA 62 06/09/2021   QFTBGOLDPLUS NEGATIVE 06/06/2022    Speciality Comments: Methotrexate-diarrhea, Arava-dermatitis Enbrel 08/2020  Procedures:  No procedures performed Allergies: Contrast media [iodinated contrast media], Latex, Leflunomide, Methotrexate  derivatives, Metronidazole, and Sulfamethoxazole-trimethoprim   Assessment / Plan:     Visit Diagnoses: Rheumatoid arthritis with rheumatoid factor of multiple sites without organ or systems involvement ([place])  High risk medication use  Primary osteoarthritis of both hands  Primary osteoarthritis of both feet  DDD (degenerative disc disease), lumbar  Other secondary scoliosis, lumbar region  Positive ANA (antinuclear antibody)  Osteopenia of multiple sites  Vitamin D deficiency  Essential hypertension  MVP (mitral valve prolapse)  History of gastroesophageal reflux (GERD)  History of breast cancer  History of Barrett's esophagus  Orders: No orders of the defined types were placed in this encounter.  No orders of the defined types were placed in this encounter.   Face-to-face time spent with patient was *** minutes. Greater than 50% of time was spent in counseling and coordination of care.  Follow-Up Instructions: No follow-ups on file.   Ofilia Neas, PA-C  Note - This record has been created using Dragon software.  Chart creation errors have been sought, but may not always  have been located. Such creation errors do not reflect on  the standard of medical care.

## 2023-02-28 NOTE — Progress Notes (Unsigned)
Office Visit Note  Patient: Victoria Sharp             Date of Birth: April 09, 1941           MRN: [id number]             PCP: [name]., MD Referring: [name].,* Visit Date: 03/02/2023 Occupation: '@GUAROCC'$ @  Subjective:  No chief complaint on file.   History of Present Illness: Victoria Sharp is a 82 y.o. female ***     Activities of Daily Living:  Patient reports morning stiffness for *** {minute/hour:19697}.   Patient {ACTIONS;DENIES/REPORTS:21021675::"Denies"} nocturnal pain.  Difficulty dressing/grooming: {ACTIONS;DENIES/REPORTS:21021675::"Denies"} Difficulty climbing stairs: {ACTIONS;DENIES/REPORTS:21021675::"Denies"} Difficulty getting out of chair: {ACTIONS;DENIES/REPORTS:21021675::"Denies"} Difficulty using hands for taps, buttons, cutlery, and/or writing: {ACTIONS;DENIES/REPORTS:21021675::"Denies"}  No Rheumatology ROS completed.   PMFS History:  Patient Active Problem List   Diagnosis Date Noted   Spondylolisthesis of lumbar region 03/07/2022   TIA (transient ischemic attack) 01/27/2021   Rheumatoid arthritis with rheumatoid factor of multiple sites without organ or systems involvement ([place]) 06/26/2020   Essential hypertension 06/26/2020   MVP (mitral valve prolapse) 06/26/2020   History of Barrett's esophagus 06/26/2020   History of gastroesophageal reflux (GERD) 06/26/2020   History of breast cancer 06/26/2020   High risk medication use 06/26/2020    Past Medical History:  Diagnosis Date   Cancer ([place])    Dermatitis    History of breast cancer    Hypertension    Migraines    PONV (postoperative nausea and vomiting)    Rheumatoid arthritis (HCC)    Tachycardia    TIA (transient ischemic attack) 01/2020   questionable TIA vs migraine per neurologist    Family History  Problem Relation Age of Onset   Heart attack Father    Kidney disease Brother    Cancer Brother    Past Surgical History:  Procedure Laterality  Date   ABDOMINAL HYSTERECTOMY     ABLATION  08/2021   Dr. Ernestina Patches   BREAST LUMPECTOMY Left 1998   NEUROMA SURGERY Right 03/2020   TRANSFORAMINAL LUMBAR INTERBODY FUSION W/ MIS 1 LEVEL N/A 03/07/2022   Procedure: LUMBAR FOUR- FIVE,  MINIMALLY  INVASIVE TRANSFORAMINAL LUMBAR  INTERBODY FUSION  w/ METRIX;  Surgeon: Judith Part, MD;  Location: [facility];  Service: Neurosurgery;  Laterality: N/A;  3C/RM 79   WEDGE RESECTION     Social History   Social History Narrative   Lives with husband in home   Right Handed   Drinks coffee for caffeine   Immunization History  Administered Date(s) Administered   PFIZER(Purple Top)SARS-COV-2 Vaccination 01/09/2020, 01/30/2020, 09/11/2020, 10/18/2021     Objective: Vital Signs: There were no vitals taken for this visit.   Physical Exam   Musculoskeletal Exam: ***  CDAI Exam: CDAI Score: -- Patient Global: --; Provider Global: -- Swollen: --; Tender: -- Joint Exam 03/02/2023   No joint exam has been documented for this visit   There is currently no information documented on the homunculus. Go to the Rheumatology activity and complete the homunculus joint exam.  Investigation: No additional findings.  Imaging: No results found.  Recent Labs: Lab Results  Component Value Date   WBC 8.1 12/09/2022   HGB 14.5 12/09/2022   PLT 259 12/09/2022   NA 140 12/09/2022   K 3.9 12/09/2022   CL 106 12/09/2022   CO2 24 12/09/2022   GLUCOSE 92 12/09/2022   BUN 19 12/09/2022   CREATININE 1.01 (H) 12/09/2022  BILITOT 0.8 12/09/2022   ALKPHOS 65 01/27/2021   AST 16 12/09/2022   ALT 9 12/09/2022   PROT 6.9 12/09/2022   ALBUMIN 4.3 01/27/2021   CALCIUM 9.4 12/09/2022   GFRAA 62 06/09/2021   QFTBGOLDPLUS NEGATIVE 06/06/2022    Speciality Comments: Methotrexate-diarrhea, Arava-dermatitis Enbrel 08/2020  Procedures:  No procedures performed Allergies: Contrast media [iodinated contrast media], Latex, Leflunomide, Methotrexate  derivatives, Metronidazole, and Sulfamethoxazole-trimethoprim   Assessment / Plan:     Visit Diagnoses: Rheumatoid arthritis with rheumatoid factor of multiple sites without organ or systems involvement ([place])  High risk medication use  Primary osteoarthritis of both hands  Primary osteoarthritis of both feet  DDD (degenerative disc disease), lumbar  Other secondary scoliosis, lumbar region  Positive ANA (antinuclear antibody)  Osteopenia of multiple sites  Vitamin D deficiency  Essential hypertension  MVP (mitral valve prolapse)  History of gastroesophageal reflux (GERD)  History of breast cancer  History of Barrett's esophagus  Orders: No orders of the defined types were placed in this encounter.  No orders of the defined types were placed in this encounter.   Face-to-face time spent with patient was *** minutes. Greater than 50% of time was spent in counseling and coordination of care.  Follow-Up Instructions: No follow-ups on file.   Ofilia Neas, PA-C  Note - This record has been created using Dragon software.  Chart creation errors have been sought, but may not always  have been located. Such creation errors do not reflect on  the standard of medical care.

## 2023-03-01 ENCOUNTER — Ambulatory Visit: Payer: Medicare Other | Admitting: Physician Assistant

## 2023-03-01 DIAGNOSIS — I341 Nonrheumatic mitral (valve) prolapse: Secondary | ICD-10-CM

## 2023-03-01 DIAGNOSIS — Z853 Personal history of malignant neoplasm of breast: Secondary | ICD-10-CM

## 2023-03-01 DIAGNOSIS — M19071 Primary osteoarthritis, right ankle and foot: Secondary | ICD-10-CM

## 2023-03-01 DIAGNOSIS — M0579 Rheumatoid arthritis with rheumatoid factor of multiple sites without organ or systems involvement: Secondary | ICD-10-CM

## 2023-03-01 DIAGNOSIS — I10 Essential (primary) hypertension: Secondary | ICD-10-CM

## 2023-03-01 DIAGNOSIS — M19041 Primary osteoarthritis, right hand: Secondary | ICD-10-CM

## 2023-03-01 DIAGNOSIS — Z8719 Personal history of other diseases of the digestive system: Secondary | ICD-10-CM

## 2023-03-01 DIAGNOSIS — R768 Other specified abnormal immunological findings in serum: Secondary | ICD-10-CM

## 2023-03-01 DIAGNOSIS — M5136 Other intervertebral disc degeneration, lumbar region: Secondary | ICD-10-CM

## 2023-03-01 DIAGNOSIS — Z79899 Other long term (current) drug therapy: Secondary | ICD-10-CM

## 2023-03-01 DIAGNOSIS — M4156 Other secondary scoliosis, lumbar region: Secondary | ICD-10-CM

## 2023-03-01 DIAGNOSIS — M8589 Other specified disorders of bone density and structure, multiple sites: Secondary | ICD-10-CM

## 2023-03-01 DIAGNOSIS — E559 Vitamin D deficiency, unspecified: Secondary | ICD-10-CM

## 2023-03-02 ENCOUNTER — Encounter: Payer: Self-pay | Admitting: Physician Assistant

## 2023-03-02 ENCOUNTER — Other Ambulatory Visit: Payer: Self-pay | Admitting: *Deleted

## 2023-03-02 ENCOUNTER — Ambulatory Visit: Payer: Medicare Other | Attending: Physician Assistant | Admitting: Physician Assistant

## 2023-03-02 VITALS — BP 149/86 | HR 66 | Resp 14 | Ht 62.0 in | Wt 151.0 lb

## 2023-03-02 DIAGNOSIS — I10 Essential (primary) hypertension: Secondary | ICD-10-CM | POA: Insufficient documentation

## 2023-03-02 DIAGNOSIS — R768 Other specified abnormal immunological findings in serum: Secondary | ICD-10-CM

## 2023-03-02 DIAGNOSIS — Z8719 Personal history of other diseases of the digestive system: Secondary | ICD-10-CM | POA: Diagnosis present

## 2023-03-02 DIAGNOSIS — M0579 Rheumatoid arthritis with rheumatoid factor of multiple sites without organ or systems involvement: Secondary | ICD-10-CM

## 2023-03-02 DIAGNOSIS — M8589 Other specified disorders of bone density and structure, multiple sites: Secondary | ICD-10-CM

## 2023-03-02 DIAGNOSIS — E559 Vitamin D deficiency, unspecified: Secondary | ICD-10-CM | POA: Insufficient documentation

## 2023-03-02 DIAGNOSIS — M4156 Other secondary scoliosis, lumbar region: Secondary | ICD-10-CM | POA: Diagnosis present

## 2023-03-02 DIAGNOSIS — M19042 Primary osteoarthritis, left hand: Secondary | ICD-10-CM | POA: Diagnosis present

## 2023-03-02 DIAGNOSIS — I341 Nonrheumatic mitral (valve) prolapse: Secondary | ICD-10-CM | POA: Diagnosis present

## 2023-03-02 DIAGNOSIS — M19071 Primary osteoarthritis, right ankle and foot: Secondary | ICD-10-CM

## 2023-03-02 DIAGNOSIS — M19041 Primary osteoarthritis, right hand: Secondary | ICD-10-CM

## 2023-03-02 DIAGNOSIS — Z111 Encounter for screening for respiratory tuberculosis: Secondary | ICD-10-CM | POA: Diagnosis present

## 2023-03-02 DIAGNOSIS — M19072 Primary osteoarthritis, left ankle and foot: Secondary | ICD-10-CM | POA: Insufficient documentation

## 2023-03-02 DIAGNOSIS — Z853 Personal history of malignant neoplasm of breast: Secondary | ICD-10-CM | POA: Diagnosis present

## 2023-03-02 DIAGNOSIS — M5136 Other intervertebral disc degeneration, lumbar region: Secondary | ICD-10-CM | POA: Insufficient documentation

## 2023-03-02 DIAGNOSIS — Z79899 Other long term (current) drug therapy: Secondary | ICD-10-CM | POA: Insufficient documentation

## 2023-03-02 MED ORDER — ENBREL MINI 50 MG/ML ~~LOC~~ SOCT: SUBCUTANEOUS | 0 refills | Status: DC

## 2023-03-02 NOTE — Patient Instructions (Signed)
Standing Labs We placed an order today for your standing lab work.   Please have your standing labs drawn in June and every 3 months    Please have your labs drawn 2 weeks prior to your appointment so that the provider can discuss your lab results at your appointment, if possible.  Please note that you may see your imaging and lab results in [place] before we have reviewed them. We will contact you once all results are reviewed. Please allow our office up to 72 hours to thoroughly review all of the results before contacting the office for clarification of your results.  WALK-IN LAB HOURS  Monday through Thursday from 8:00 am -12:30 pm and 1:00 pm-5:00 pm and Friday from 8:00 am-12:00 pm.  Patients with office visits requiring labs will be seen before walk-in labs.  You may encounter longer than normal wait times. Please allow additional time. Wait times may be shorter on  Monday and Thursday afternoons.  We do not book appointments for walk-in labs. We appreciate your patience and understanding with our staff.   Labs are drawn by [facility]. Please bring your co-pay at the time of your lab draw.  You may receive a bill from [facility] for your lab work.  Please note if you are on Hydroxychloroquine and and an order has been placed for a Hydroxychloroquine level,  you will need to have it drawn 4 hours or more after your last dose.  If you wish to have your labs drawn at another location, please call the office 24 hours in advance so we can fax the orders.  The office is located at [address] [place], [place], [place] [address]   If you have any questions regarding directions or hours of operation,  please call [phone].   As a reminder, please drink plenty of water prior to coming for your lab work. Thanks!

## 2023-03-03 LAB — COMPLETE METABOLIC PANEL WITH GFR
AG Ratio: 1.7 (calc) (ref 1.0–2.5)
ALT: 13 U/L (ref 6–29)
AST: 19 U/L (ref 10–35)
Albumin: 4.3 g/dL (ref 3.6–5.1)
Alkaline phosphatase (APISO): 76 U/L (ref 37–153)
BUN/Creatinine Ratio: 19 (calc) (ref 6–22)
BUN: 18 mg/dL (ref 7–25)
CO2: 27 mmol/L (ref 20–32)
Calcium: 9.5 mg/dL (ref 8.6–10.4)
Chloride: 105 mmol/L (ref 98–110)
Creat: 0.96 mg/dL — ABNORMAL HIGH (ref 0.60–0.95)
Globulin: 2.6 g/dL (calc) (ref 1.9–3.7)
Glucose, Bld: 85 mg/dL (ref 65–99)
Potassium: 4.4 mmol/L (ref 3.5–5.3)
Sodium: 140 mmol/L (ref 135–146)
Total Bilirubin: 0.6 mg/dL (ref 0.2–1.2)
Total Protein: 6.9 g/dL (ref 6.1–8.1)
eGFR: 60 mL/min/{1.73_m2} (ref >=60)

## 2023-03-03 LAB — CBC WITH DIFFERENTIAL/PLATELET
Absolute Monocytes: 620 cells/uL (ref 200–950)
Basophils Absolute: 50 cells/uL (ref 0–200)
Basophils Relative: 0.8 %
Eosinophils Absolute: 459 cells/uL (ref 15–500)
Eosinophils Relative: 7.4 %
HCT: 43.5 % (ref 35.0–45.0)
Hemoglobin: 14.6 g/dL (ref 11.7–15.5)
Lymphs Abs: 1699 cells/uL (ref 850–3900)
MCH: 30.4 pg (ref 27.0–33.0)
MCHC: 33.6 g/dL (ref 32.0–36.0)
MCV: 90.6 fL (ref 80.0–100.0)
MPV: 11.6 fL (ref 7.5–12.5)
Monocytes Relative: 10 %
Neutro Abs: 3373 cells/uL (ref 1500–7800)
Neutrophils Relative %: 54.4 %
Platelets: 264 10*3/uL (ref 140–400)
RBC: 4.8 10*6/uL (ref 3.80–5.10)
RDW: 13 % (ref 11.0–15.0)
Total Lymphocyte: 27.4 %
WBC: 6.2 10*3/uL (ref 3.8–10.8)

## 2023-03-03 NOTE — Progress Notes (Signed)
CBC WNL.  Creatinine is borderline elevated-0.96.  rest of CMP WNL.

## 2023-04-10 ENCOUNTER — Telehealth: Payer: Self-pay | Admitting: Rheumatology

## 2023-04-10 DIAGNOSIS — M8589 Other specified disorders of bone density and structure, multiple sites: Secondary | ICD-10-CM

## 2023-04-10 NOTE — Telephone Encounter (Signed)
Ok to place order for [employer] [facility]

## 2023-04-10 NOTE — Telephone Encounter (Signed)
Patient called the office stating she was referred to [facility] for a bone density and she cannot get anyone to bring her to [place]. Patient states she would like to have the orders for the bone density sent to the [facility] in [place]. Patient states she has had it done there before.

## 2023-04-11 NOTE — Telephone Encounter (Signed)
Patient advised her order for her bone density scan has been changed to [facility]. Patient expressed understanding.

## 2023-05-02 ENCOUNTER — Ambulatory Visit (HOSPITAL_BASED_OUTPATIENT_CLINIC_OR_DEPARTMENT_OTHER)
Admission: RE | Admit: 2023-05-02 | Discharge: 2023-05-02 | Disposition: A | Discharge status: Home / Self Care | Payer: Medicare Other | Source: Ambulatory Visit | Attending: Physician Assistant | Admitting: Physician Assistant

## 2023-05-02 DIAGNOSIS — M8589 Other specified disorders of bone density and structure, multiple sites: Secondary | ICD-10-CM | POA: Diagnosis not present

## 2023-05-02 NOTE — Progress Notes (Signed)
DEXA consistent with osteopenia---recommend calcium, vitamin D, and resistive exercises.  Results will be discussed in detail at new follow up visit.

## 2023-07-17 NOTE — Progress Notes (Unsigned)
Office Visit Note  Patient: Victoria Sharp             Date of Birth: 03-Nov-1941           MRN: [id number]             PCP: Cheron Schaumann., MD Referring: Cheron Schaumann.,* Visit Date: 07/31/2023 Occupation: @GUAROCC @  Subjective:  Medication monitoring  History of Present Illness: Victoria Sharp is a 82 y.o. female with history of seropositive rheumatoid arthritis and osteoarthritis.  Patient remains on Enbrel 50 mg sq injections every 21 days.  Patient is tolerating Enbrel without any side effects or injection site reactions.  She has been able to space the dose of Enbrel without any recent or current flares.  She denies any morning stiffness, nocturnal pain, or difficulty with ADLs.  Patient remains under the care of of a spine specialist for management of discomfort in her lower back.  According to the patient she has been diagnosed with sacroiliitis.  She has injections in her lower back about every 5 months. Patient states about 1 month ago she was diagnosed with diverticulitis.  She states that she held enbrel while recovering from the infection and has not had any recurrence of symptoms.  She denies any recurrent infections.    Activities of Daily Living:  Patient reports morning stiffness for 0 minutes.   Patient Denies nocturnal pain.  Difficulty dressing/grooming: Denies Difficulty climbing stairs: Denies Difficulty getting out of chair: Denies Difficulty using hands for taps, buttons, cutlery, and/or writing: Denies  Review of Systems  Constitutional:  Negative for fatigue.  HENT:  Negative for mouth sores and mouth dryness.   Eyes:  Negative for dryness.  Respiratory:  Negative for shortness of breath.   Cardiovascular:  Negative for chest pain and palpitations.  Gastrointestinal:  Positive for diarrhea. Negative for blood in stool and constipation.  Endocrine: Negative for increased urination.  Genitourinary:  Negative for involuntary urination.   Musculoskeletal:  Positive for joint pain, joint pain, myalgias and myalgias. Negative for gait problem, joint swelling, muscle weakness, morning stiffness and muscle tenderness.  Skin:  Negative for color change, rash, hair loss and sensitivity to sunlight.  Allergic/Immunologic: Positive for susceptible to infections.  Neurological:  Negative for dizziness and headaches.  Hematological:  Negative for swollen glands.  Psychiatric/Behavioral:  Negative for depressed mood and sleep disturbance. The patient is not nervous/anxious.     PMFS History:  Patient Active Problem List   Diagnosis Date Noted   Spondylolisthesis of lumbar region 03/07/2022   TIA (transient ischemic attack) 01/27/2021   Rheumatoid arthritis with rheumatoid factor of multiple sites without organ or systems involvement (HCC) 06/26/2020   Essential hypertension 06/26/2020   MVP (mitral valve prolapse) 06/26/2020   History of Barrett's esophagus 06/26/2020   History of gastroesophageal reflux (GERD) 06/26/2020   History of breast cancer 06/26/2020   High risk medication use 06/26/2020    Past Medical History:  Diagnosis Date   Cancer (HCC)    Dermatitis    History of breast cancer    History of diverticulitis    Hypertension    Migraines    PONV (postoperative nausea and vomiting)    Rheumatoid arthritis (HCC)    Sacroiliitis (HCC)    right side   Tachycardia    TIA (transient ischemic attack) 01/2020   questionable TIA vs migraine per neurologist    Family History  Problem Relation Age of Onset   Heart attack Father  Kidney disease Brother    Cancer Brother    Past Surgical History:  Procedure Laterality Date   ABDOMINAL HYSTERECTOMY     ABLATION  08/2021   Dr. Alvester Morin   BREAST LUMPECTOMY Left 1998   NEUROMA SURGERY Right 03/2020   TRANSFORAMINAL LUMBAR INTERBODY FUSION W/ MIS 1 LEVEL N/A 03/07/2022   Procedure: LUMBAR FOUR- FIVE,  MINIMALLY  INVASIVE TRANSFORAMINAL LUMBAR  INTERBODY FUSION  w/  METRIX;  Surgeon: Jadene Pierini, MD;  Location: [facility];  Service: Neurosurgery;  Laterality: N/A;  3C/RM 21   WEDGE RESECTION     Social History   Social History Narrative   Lives with husband in home   Right Handed   Drinks coffee for caffeine   Immunization History  Administered Date(s) Administered   Covid-19, Mrna,Vaccine(Spikevax)32yrs and older 10/07/2022   PFIZER(Purple Top)SARS-COV-2 Vaccination 01/09/2020, 01/30/2020, 09/11/2020, 10/18/2021     Objective: Vital Signs: BP 136/88 (BP Location: Left Arm, Patient Position: Sitting, Cuff Size: Normal)   Pulse 73   Resp 16   Ht 5\' 2"  (1.575 m)   Wt 149 lb 6.4 oz (67.8 kg)   BMI 27.33 kg/m    Physical Exam Vitals and nursing note reviewed.  Constitutional:      Appearance: She is well-developed.  HENT:     Head: Normocephalic and atraumatic.  Eyes:     Conjunctiva/sclera: Conjunctivae normal.  Cardiovascular:     Rate and Rhythm: Normal rate and regular rhythm.     Heart sounds: Normal heart sounds.  Pulmonary:     Effort: Pulmonary effort is normal.     Breath sounds: Normal breath sounds.  Abdominal:     General: Bowel sounds are normal.     Palpations: Abdomen is soft.  Musculoskeletal:     Cervical back: Normal range of motion.  Lymphadenopathy:     Cervical: No cervical adenopathy.  Skin:    General: Skin is warm and dry.     Capillary Refill: Capillary refill takes less than 2 seconds.  Neurological:     Mental Status: She is alert and oriented to person, place, and time.  Psychiatric:        Behavior: Behavior normal.      Musculoskeletal Exam: C-spine has good range of motion.  Tenderness over the right SI joint.  No midline spinal tenderness.  Shoulder joints, elbow joints, wrist joints, MCPs, PIPs, DIPs have good range of motion with no synovitis.  Complete fist formation bilaterally.  Hip joints have good range of motion with no groin pain.  Knee joints have good range of motion with no warmth  or effusion.  Ankle joints have good range of motion with no tenderness or synovitis.  Bunions noted over bilateral first great toes.  Some PIP and DIP thickening consistent with osteoarthritis of both feet.  No synovitis over MTP joints noted.  CDAI Exam: CDAI Score: -- Patient Global: 10 / 100; Provider Global: 10 / 100 Swollen: --; Tender: -- Joint Exam 07/31/2023   No joint exam has been documented for this visit   There is currently no information documented on the homunculus. Go to the Rheumatology activity and complete the homunculus joint exam.  Investigation: No additional findings.  Imaging: No results found.  Recent Labs: Lab Results  Component Value Date   WBC 6.2 03/02/2023   HGB 14.6 03/02/2023   PLT 264 03/02/2023   NA 140 03/02/2023   K 4.4 03/02/2023   CL 105 03/02/2023   CO2 27  03/02/2023   GLUCOSE 85 03/02/2023   BUN 18 03/02/2023   CREATININE 0.96 (H) 03/02/2023   BILITOT 0.6 03/02/2023   ALKPHOS 65 01/27/2021   AST 19 03/02/2023   ALT 13 03/02/2023   PROT 6.9 03/02/2023   ALBUMIN 4.3 01/27/2021   CALCIUM 9.5 03/02/2023   GFRAA 62 06/09/2021   QFTBGOLDPLUS NEGATIVE 06/06/2022    Speciality Comments: Methotrexate-diarrhea, Arava-dermatitis Enbrel 08/2020  Procedures:  No procedures performed Allergies: Contrast media [iodinated contrast media], Latex, Leflunomide, Methotrexate derivatives, Metronidazole, and Sulfamethoxazole-trimethoprim     Assessment / Plan:     Visit Diagnoses: Rheumatoid arthritis with rheumatoid factor of multiple sites without organ or systems involvement (HCC) - Positive RF, +anti-CCP, +ANA, +synovitis: She has no joint tenderness or synovitis on examination today.  She has not had any signs or symptoms of a rheumatoid arthritis flare.  She has been spacing Enbrel to 50 mg subcutaneous injections every 21 days.  She is tolerating Enbrel without any side effects or injection site reactions.  She has not noticed any new or  worsening symptoms spacing and build every 21 days.  She has not had any morning stiffness, nocturnal pain, or difficulty with ADLs.  She will remain on Enbrel 50 mg subcutaneous injections every 21 days.  She was advised to notify us if she develop signs or symptoms of a flare.  She will follow-up in the office in 5 months or sooner if needed.  High risk medication use -Spacing enbrel 50 mg sq injections to every 21 days.  Enbrel-started 09/24/20. CBC and CMP updated on 06/23/23. Her next lab work will be due in September and every 3 months.  Standing orders for CBC and CMP remain in place.  TB gold negative on 06/06/22.  Order for TB gold released today.   Recently diagnosed and treated for diverticulitis.  No recurrent infections.  Discussed the importance of holding enbrel if she develops signs or symptoms of an infection and to resume once the infection has completely cleared.  - Plan: QuantiFERON-TB Gold Plus  Screening for tuberculosis - Order for TB gold released today. Plan: QuantiFERON-TB Gold Plus  Primary osteoarthritis of both hands: No tenderness or synovitis noted on examination today.  Complete fist formation bilaterally.  Primary osteoarthritis of both feet: She has some PIP and DIP thickening consistent with osteoarthritis of both feet.  Thickening of bilateral first great toes at the MTP joint. No tenderness or synovitis over MTP joints.  Good range of motion of both ankle joints with no tenderness or synovitis.  DDD (degenerative disc disease), lumbar - Status post a spinal fusion March 07, 2022.  Patient mains under the care of of her spine specialist.  She has no midline spinal tenderness at this time.  Some tenderness over the right SI joint.  Other secondary scoliosis, lumbar region  Positive ANA (antinuclear antibody) - No clinical features of systemic lupus at this time.  Osteopenia of multiple sites - 03/11/21: DEXA AP spine BMD 0.854 with T score -1.8.   DEXA updated on  05/02/23: The BMD measured at Femur Neck Left is 0.794 g/cm2 with a T-score of 1.8.   She is taking vitamin D 1000 units daily.    Vitamin D deficiency: She is taking vitamin D 1000 units daily.    Other medical conditions are listed as follows:   Essential hypertension: BP was 136/88 today in the office.    MVP (mitral valve prolapse)  History of gastroesophageal reflux (GERD)  History of  Barrett's esophagus  History of breast cancer   Orders: Orders Placed This Encounter  Procedures   QuantiFERON-TB Gold Plus   No orders of the defined types were placed in this encounter.  Follow-Up Instructions: Return in about 5 months (around 12/31/2023) for Rheumatoid arthritis.   Gearldine Bienenstock, PA-C  Note - This record has been created using Dragon software.  Chart creation errors have been sought, but may not always  have been located. Such creation errors do not reflect on  the standard of medical care.

## 2023-07-31 ENCOUNTER — Ambulatory Visit: Payer: Medicare Other | Attending: Physician Assistant | Admitting: Physician Assistant

## 2023-07-31 ENCOUNTER — Encounter: Payer: Self-pay | Admitting: Physician Assistant

## 2023-07-31 ENCOUNTER — Other Ambulatory Visit: Payer: Self-pay

## 2023-07-31 VITALS — BP 136/88 | HR 73 | Resp 16 | Ht 62.0 in | Wt 149.4 lb

## 2023-07-31 DIAGNOSIS — M5136 Other intervertebral disc degeneration, lumbar region: Secondary | ICD-10-CM | POA: Insufficient documentation

## 2023-07-31 DIAGNOSIS — I341 Nonrheumatic mitral (valve) prolapse: Secondary | ICD-10-CM | POA: Insufficient documentation

## 2023-07-31 DIAGNOSIS — E559 Vitamin D deficiency, unspecified: Secondary | ICD-10-CM | POA: Insufficient documentation

## 2023-07-31 DIAGNOSIS — R768 Other specified abnormal immunological findings in serum: Secondary | ICD-10-CM | POA: Diagnosis present

## 2023-07-31 DIAGNOSIS — Z853 Personal history of malignant neoplasm of breast: Secondary | ICD-10-CM | POA: Diagnosis present

## 2023-07-31 DIAGNOSIS — M19041 Primary osteoarthritis, right hand: Secondary | ICD-10-CM | POA: Diagnosis present

## 2023-07-31 DIAGNOSIS — M19071 Primary osteoarthritis, right ankle and foot: Secondary | ICD-10-CM | POA: Insufficient documentation

## 2023-07-31 DIAGNOSIS — M0579 Rheumatoid arthritis with rheumatoid factor of multiple sites without organ or systems involvement: Secondary | ICD-10-CM | POA: Insufficient documentation

## 2023-07-31 DIAGNOSIS — M19072 Primary osteoarthritis, left ankle and foot: Secondary | ICD-10-CM | POA: Insufficient documentation

## 2023-07-31 DIAGNOSIS — M4156 Other secondary scoliosis, lumbar region: Secondary | ICD-10-CM | POA: Diagnosis present

## 2023-07-31 DIAGNOSIS — Z79899 Other long term (current) drug therapy: Secondary | ICD-10-CM | POA: Diagnosis present

## 2023-07-31 DIAGNOSIS — M19042 Primary osteoarthritis, left hand: Secondary | ICD-10-CM | POA: Diagnosis present

## 2023-07-31 DIAGNOSIS — Z8719 Personal history of other diseases of the digestive system: Secondary | ICD-10-CM | POA: Diagnosis present

## 2023-07-31 DIAGNOSIS — Z111 Encounter for screening for respiratory tuberculosis: Secondary | ICD-10-CM | POA: Insufficient documentation

## 2023-07-31 DIAGNOSIS — M8589 Other specified disorders of bone density and structure, multiple sites: Secondary | ICD-10-CM | POA: Insufficient documentation

## 2023-07-31 DIAGNOSIS — I10 Essential (primary) hypertension: Secondary | ICD-10-CM | POA: Diagnosis present

## 2023-07-31 MED ORDER — ENBREL MINI 50 MG/ML ~~LOC~~ SOCT: SUBCUTANEOUS | 0 refills | Status: DC

## 2023-07-31 NOTE — Telephone Encounter (Signed)
Please review and sign pended enbrel refill. Thanks!

## 2023-07-31 NOTE — Patient Instructions (Addendum)
Standing Labs We placed an order today for your standing lab work.   Please have your standing labs drawn at the end of September and every 3 months   Please have your labs drawn 2 weeks prior to your appointment so that the provider can discuss your lab results at your appointment, if possible.  Please note that you may see your imaging and lab results in MyChart before we have reviewed them. We will contact you once all results are reviewed. Please allow our office up to 72 hours to thoroughly review all of the results before contacting the office for clarification of your results.  WALK-IN LAB HOURS  Monday through Thursday from 8:00 am -12:30 pm and 1:00 pm-5:00 pm and Friday from 8:00 am-12:00 pm.  Patients with office visits requiring labs will be seen before walk-in labs.  You may encounter longer than normal wait times. Please allow additional time. Wait times may be shorter on  Monday and Thursday afternoons.  We do not book appointments for walk-in labs. We appreciate your patience and understanding with our staff.   Labs are drawn by [facility]. Please bring your co-pay at the time of your lab draw.  You may receive a bill from [facility] for your lab work.  Please note if you are on Hydroxychloroquine and and an order has been placed for a Hydroxychloroquine level,  you will need to have it drawn 4 hours or more after your last dose.  If you wish to have your labs drawn at another location, please call the office 24 hours in advance so we can fax the orders.  The office is located at [address], [place], [place] [address]   If you have any questions regarding directions or hours of operation,  please call [phone].   As a reminder, please drink plenty of water prior to coming for your lab work. Thanks!

## 2023-08-02 NOTE — Progress Notes (Signed)
TB gold negative

## 2023-10-27 ENCOUNTER — Telehealth: Payer: Self-pay | Admitting: Rheumatology

## 2023-10-27 ENCOUNTER — Other Ambulatory Visit: Payer: Self-pay | Admitting: *Deleted

## 2023-10-27 DIAGNOSIS — M0579 Rheumatoid arthritis with rheumatoid factor of multiple sites without organ or systems involvement: Secondary | ICD-10-CM

## 2023-10-27 DIAGNOSIS — Z79899 Other long term (current) drug therapy: Secondary | ICD-10-CM

## 2023-10-27 DIAGNOSIS — R768 Other specified abnormal immunological findings in serum: Secondary | ICD-10-CM

## 2023-10-27 MED ORDER — ENBREL MINI 50 MG/ML ~~LOC~~ SOCT: SUBCUTANEOUS | 0 refills | Status: DC

## 2023-10-27 NOTE — Telephone Encounter (Signed)
Last Fill: 07/31/2023  Labs: 10/10/2023 CMP eGFR 58, CBC 06/23/2023 MPV 10.3 I called patient to update CBC on 10/30/2023.  TB Gold: 07/31/2023   TB gold negative   Next Visit: 01/04/2024  Last Visit: 07/31/2023  [id number] arthritis with rheumatoid factor of multiple sites without organ or systems involvement   Current Dose per office note 07/31/2023: Spacing enbrel 50 mg sq injections to every 21 days   Okay to refill Enbrel?

## 2023-10-27 NOTE — Telephone Encounter (Signed)
Pt states she ask for a refill at her appointment for enbrel. Pt called [facility] to ask about when she will have it sent and they state they did not receive a prescription refill. Pt gave me information to fax for the medication to receive it quicker at [phone].

## 2023-10-27 NOTE — Telephone Encounter (Signed)
I called patient, patient will have CBC updated 10/30/2023.

## 2023-10-27 NOTE — Telephone Encounter (Signed)
Pt would like a call when it it is sent over at [phone]

## 2023-10-30 LAB — CBC WITH DIFFERENTIAL/PLATELET
Absolute Lymphocytes: 1663 {cells}/uL (ref 850–3900)
Absolute Monocytes: 526 {cells}/uL (ref 200–950)
Basophils Absolute: 62 {cells}/uL (ref 0–200)
Basophils Relative: 1.1 %
Eosinophils Absolute: 370 {cells}/uL (ref 15–500)
Eosinophils Relative: 6.6 %
HCT: 45.5 % — ABNORMAL HIGH (ref 35.0–45.0)
Hemoglobin: 15 g/dL (ref 11.7–15.5)
MCH: 30.1 pg (ref 27.0–33.0)
MCHC: 33 g/dL (ref 32.0–36.0)
MCV: 91.4 fL (ref 80.0–100.0)
MPV: 12.1 fL (ref 7.5–12.5)
Monocytes Relative: 9.4 %
Neutro Abs: 2979 {cells}/uL (ref 1500–7800)
Neutrophils Relative %: 53.2 %
Platelets: 292 10*3/uL (ref 140–400)
RBC: 4.98 10*6/uL (ref 3.80–5.10)
RDW: 12 % (ref 11.0–15.0)
Total Lymphocyte: 29.7 %
WBC: 5.6 10*3/uL (ref 3.8–10.8)

## 2023-10-31 NOTE — Progress Notes (Signed)
CBC is normal.

## 2023-11-20 ENCOUNTER — Telehealth: Payer: Self-pay | Admitting: Rheumatology

## 2023-11-20 NOTE — Telephone Encounter (Signed)
Pt called stating she would like some help filling out [employer] paperwork for her medication (enbrel). Pt states her husband had a retirement increase this year. Pt states if she can not get the cost down for the medication she will need to be taken off of it due to the price.

## 2023-11-21 ENCOUNTER — Telehealth: Payer: Self-pay | Admitting: Rheumatology

## 2023-11-21 NOTE — Telephone Encounter (Signed)
Pt is requesting a call back from the pharmacist at [phone]

## 2023-11-21 NOTE — Telephone Encounter (Signed)
Returned call to patient. She'll complete renewal application since they are about --$3k-4k over income limit. She is interested in discussing coming off of Enbrel at next office visit. She has enough Enbrel at this time to get through the rest of hte year.  Chesley Mires, PharmD, MPH, BCPS, CPP Clinical Pharmacist (Rheumatology and Pulmonology)

## 2023-12-02 ENCOUNTER — Emergency Department (HOSPITAL_BASED_OUTPATIENT_CLINIC_OR_DEPARTMENT_OTHER): Payer: Medicare Other

## 2023-12-02 ENCOUNTER — Emergency Department (HOSPITAL_BASED_OUTPATIENT_CLINIC_OR_DEPARTMENT_OTHER)
Admission: EM | Admit: 2023-12-02 | Discharge: 2023-12-02 | Disposition: A | Discharge status: Home / Self Care | Payer: Medicare Other | Attending: Emergency Medicine | Admitting: Emergency Medicine

## 2023-12-02 ENCOUNTER — Other Ambulatory Visit: Payer: Self-pay

## 2023-12-02 ENCOUNTER — Encounter (HOSPITAL_BASED_OUTPATIENT_CLINIC_OR_DEPARTMENT_OTHER): Payer: Self-pay

## 2023-12-02 DIAGNOSIS — R053 Chronic cough: Secondary | ICD-10-CM | POA: Diagnosis not present

## 2023-12-02 DIAGNOSIS — I10 Essential (primary) hypertension: Secondary | ICD-10-CM | POA: Insufficient documentation

## 2023-12-02 DIAGNOSIS — Z91040 Latex allergy status: Secondary | ICD-10-CM | POA: Diagnosis not present

## 2023-12-02 DIAGNOSIS — R059 Cough, unspecified: Secondary | ICD-10-CM | POA: Diagnosis present

## 2023-12-02 DIAGNOSIS — Z20822 Contact with and (suspected) exposure to covid-19: Secondary | ICD-10-CM | POA: Diagnosis not present

## 2023-12-02 DIAGNOSIS — Z7982 Long term (current) use of aspirin: Secondary | ICD-10-CM | POA: Diagnosis not present

## 2023-12-02 DIAGNOSIS — Z79899 Other long term (current) drug therapy: Secondary | ICD-10-CM | POA: Insufficient documentation

## 2023-12-02 LAB — RESP PANEL BY RT-PCR (RSV, FLU A&B, COVID)  RVPGX2
Influenza A by PCR: NEGATIVE
Influenza B by PCR: NEGATIVE
Resp Syncytial Virus by PCR: NEGATIVE
SARS Coronavirus 2 by RT PCR: NEGATIVE

## 2023-12-02 LAB — COMPREHENSIVE METABOLIC PANEL
ALT: 14 U/L (ref 0–44)
AST: 23 U/L (ref 15–41)
Albumin: 3.9 g/dL (ref 3.5–5.0)
Alkaline Phosphatase: 81 U/L (ref 38–126)
Anion gap: 7 (ref 5–15)
BUN: 16 mg/dL (ref 8–23)
CO2: 23 mmol/L (ref 22–32)
Calcium: 9.3 mg/dL (ref 8.9–10.3)
Chloride: 105 mmol/L (ref 98–111)
Creatinine, Ser: 0.95 mg/dL (ref 0.44–1.00)
GFR, Estimated: >60 mL/min (ref >60)
Glucose, Bld: 102 mg/dL — ABNORMAL HIGH (ref 70–99)
Potassium: 4.2 mmol/L (ref 3.5–5.1)
Sodium: 135 mmol/L (ref 135–145)
Total Bilirubin: 0.8 mg/dL (ref <1.2)
Total Protein: 7.2 g/dL (ref 6.5–8.1)

## 2023-12-02 LAB — CBC WITH DIFFERENTIAL/PLATELET
Abs Immature Granulocytes: 0.01 10*3/uL (ref 0.00–0.07)
Basophils Absolute: 0.1 10*3/uL (ref 0.0–0.1)
Basophils Relative: 2 %
Eosinophils Absolute: 0.9 10*3/uL — ABNORMAL HIGH (ref 0.0–0.5)
Eosinophils Relative: 15 %
HCT: 43.4 % (ref 36.0–46.0)
Hemoglobin: 14.3 g/dL (ref 12.0–15.0)
Immature Granulocytes: 0 %
Lymphocytes Relative: 29 %
Lymphs Abs: 1.8 10*3/uL (ref 0.7–4.0)
MCH: 30.2 pg (ref 26.0–34.0)
MCHC: 32.9 g/dL (ref 30.0–36.0)
MCV: 91.8 fL (ref 80.0–100.0)
Monocytes Absolute: 0.5 10*3/uL (ref 0.1–1.0)
Monocytes Relative: 8 %
Neutro Abs: 3 10*3/uL (ref 1.7–7.7)
Neutrophils Relative %: 46 %
Platelets: 270 10*3/uL (ref 150–400)
RBC: 4.73 MIL/uL (ref 3.87–5.11)
RDW: 13.2 % (ref 11.5–15.5)
WBC: 6.3 10*3/uL (ref 4.0–10.5)
nRBC: 0 % (ref 0.0–0.2)

## 2023-12-02 MED ORDER — FAMOTIDINE 40 MG PO TABS: 40.0000 mg | ORAL_TABLET | Freq: Every day | ORAL | 0 refills | Status: DC

## 2023-12-02 MED ORDER — BENZONATATE 100 MG PO CAPS
200.0000 mg | ORAL_CAPSULE | Freq: Once | ORAL | Status: AC
  Administered 2023-12-02: 200 mg via ORAL
  Filled 2023-12-02: qty 2

## 2023-12-02 NOTE — ED Notes (Signed)
Patient transported to X-ray 

## 2023-12-02 NOTE — ED Provider Notes (Signed)
[facility] Provider Note   CSN: [id number] Arrival date & time: 12/02/23  1093     History Chief Complaint  Patient presents with   Cough    Victoria Sharp is a 82 y.o. female.  Patient past history significant for rheumatoid arthritis, hypertension, mitral valve prolapse presents to the emergency department concerns of a cough.  She endorsed that she has had a semiproductive cough for the last 6 weeks or so.  Prior to the cough onset, she had a upper respiratory type infection and was seen by primary care provider.  She has not been on any specific medications for this infection or the cough.  States that she has some occasional shortness of breath due to persistent coughing.  Denies any hemoptysis, leg swelling, recent surgery, or prolonged immobilization.  Patient takes Enbrel for her rheumatoid arthritis.   Cough      Home Medications Prior to Admission medications   Medication Sig Start Date End Date Taking? Authorizing Provider  famotidine (PEPCID) 40 MG tablet Take 1 tablet (40 mg total) by mouth daily. 12/02/23 01/01/24 Yes Anders Simmonds T, DO  amLODipine (NORVASC) 2.5 MG tablet Take 5 mg by mouth daily.    [provider]  aspirin 81 MG chewable tablet Chew 1 tablet (81 mg total) by mouth daily. Patient taking differently: Chew 81 mg by mouth 3 (three) times a week. 01/29/21   Rhetta Mura, MD  cholecalciferol (VITAMIN D3) 25 MCG (1000 UNIT) tablet Take 1,000 Units by mouth daily.    [provider]  doxycycline (VIBRAMYCIN) 100 MG capsule Take 100 mg by mouth 2 (two) times daily. Patient not taking: Reported on 03/02/2023 09/23/22   [provider]  etanercept (ENBREL MINI) 50 MG/ML injection Inject 50mg  into the skin every 21 days. 10/27/23   Pollyann Savoy, MD  losartan (COZAAR) 100 MG tablet Take 100 mg by mouth daily. 10/29/20   [provider]  pantoprazole (PROTONIX) 20 MG  tablet Take 20 mg by mouth daily. 07/05/17   [provider]  Polyethylene Glycol 400 (BLINK TEARS OP) Apply to eye daily as needed.    [provider]  Probiotic Product (ALIGN PO) Take by mouth daily.    [provider]      Allergies    Contrast media [iodinated contrast media], Latex, Leflunomide, Methotrexate derivatives, Metronidazole, and Sulfamethoxazole-trimethoprim    Review of Systems   Review of Systems  Respiratory:  Positive for cough.   All other systems reviewed and are negative.   Physical Exam Updated Vital Signs BP (!) 149/86   Pulse 75   Temp 98.1 F (36.7 C)   Resp 18   Ht 5\' 2"  (1.575 m)   Wt 68 kg   SpO2 99%   BMI 27.42 kg/m  Physical Exam Vitals and nursing note reviewed.  Constitutional:      General: She is not in acute distress.    Appearance: She is well-developed.  HENT:     Head: Normocephalic and atraumatic.  Eyes:     Conjunctiva/sclera: Conjunctivae normal.  Cardiovascular:     Rate and Rhythm: Normal rate and regular rhythm.     Heart sounds: No murmur heard. Pulmonary:     Effort: Pulmonary effort is normal. No respiratory distress.     Breath sounds: Normal breath sounds. No wheezing, rhonchi or rales.     Comments: Coughing agitated by expiration. No coughing with inspiration. Abdominal:  Palpations: Abdomen is soft.     Tenderness: There is no abdominal tenderness.  Musculoskeletal:        General: No swelling.     Cervical back: Neck supple.  Skin:    General: Skin is warm and dry.     Capillary Refill: Capillary refill takes less than 2 seconds.  Neurological:     Mental Status: She is alert.  Psychiatric:        Mood and Affect: Mood normal.     ED Results / Procedures / Treatments   Labs (all labs ordered are listed, but only abnormal results are displayed) Labs Reviewed  CBC WITH DIFFERENTIAL/PLATELET - Abnormal; Notable for the following components:      Result Value   Eosinophils  Absolute 0.9 (*)    All other components within normal limits  COMPREHENSIVE METABOLIC PANEL - Abnormal; Notable for the following components:   Glucose, Bld 102 (*)    All other components within normal limits  RESP PANEL BY RT-PCR (RSV, FLU A&B, COVID)  [id number]    EKG None  Radiology DG Chest 2 View  Result Date: 12/02/2023 CLINICAL DATA:  82 year old female with persistent cough. EXAM: CHEST - 2 VIEW COMPARISON:  Chest radiograph 06/26/2020. FINDINGS: PA and lateral views 1009 hours. Lung volumes and mediastinal contours are stable and within normal limits. Mild tortuosity and calcification of the thoracic aorta. Normal heart size. Visualized tracheal air column is within normal limits. Circumscribed linear calcification projecting over the left lung on only the PA may be related to overlying breast implant and is unchanged. No pneumothorax, pulmonary edema, pleural effusion or confluent lung opacity. Osteopenia. No acute osseous abnormality identified. Stable cholecystectomy clips. Negative visible bowel gas. IMPRESSION: No acute cardiopulmonary abnormality. Electronically Signed   By: Odessa Fleming M.D.   On: 12/02/2023 10:15    Procedures Procedures   Medications Ordered in ED Medications  benzonatate (TESSALON) capsule 200 mg (200 mg Oral Given 12/02/23 0950)    ED Course/ Medical Decision Making/ A&P                               Medical Decision Making Amount and/or Complexity of Data Reviewed Labs: ordered. Radiology: ordered.  Risk Prescription drug management.   This patient presents to the ED for concern of cough.  Differential diagnosis includes chronic cough, poorly controlled reflux, postviral cough syndrome, pneumonia   Lab Tests:  I Ordered, and personally interpreted labs.  The pertinent results include: CBC and CMP unremarkable, respiratory panel negative for COVID-19 influenza and RSV.   Imaging Studies ordered:  I ordered imaging studies including chest  x-ray I independently visualized and interpreted imaging which showed no acute cardiopulmonary process seen I agree with the radiologist interpretation   Medicines ordered and prescription drug management:  I ordered medication including Tessalon for cough Reevaluation of the patient after these medicines showed that the patient improved I have reviewed the patients home medicines and have made adjustments as needed   Problem List / ED Course:  Patient with past history significant for rheumatoid arthritis, hypertension, and Barrett's esophagitis presents the emergency department concerns of a cough.  Reports has had a cough now for the last 6 weeks or so.  She intermittently will have some shortness of breath due to significant coughing.  Was seen by primary care provider initially after onset of symptoms and has not had notable improvement.  Denies any recent fevers,  chills, body aches or sore throat.  No chest pain, abdominal pain, nausea or vomiting, diarrhea.  Given protracted course, concern for possible GERD related cough for developing chronic cough in a postviral setting.  Labs ordered for assessment of symptoms.  Chest x-ray also ordered. Labs unremarkable.  Negative for COVID-19, influenza or RSV.  Chest x-ray unremarkable.  Patient had mild improvement with Tessalon.  Given reassuring workup and prolonged course of symptoms, suspect that this is likely a postviral cough or cough related to acid reflux.  Will start patient on famotidine.  Discussed management of cough with over-the-counter cough medications encourage patient to follow-up with primary care provider within the next 2 weeks.  Discussed return precautions.  Patient discharged home in stable condition.  Final Clinical Impression(s) / ED Diagnoses Final diagnoses:  Chronic cough    Rx / DC Orders ED Discharge Orders          Ordered    famotidine (PEPCID) 40 MG tablet  Daily        12/02/23 1047               Smitty Knudsen, PA-C 12/02/23 1127    Anders Simmonds T, DO 12/03/23 1818

## 2023-12-02 NOTE — ED Triage Notes (Signed)
The patient has had a cough for 6 weeks. She stated she has episodes of cough that leave her short of breath.

## 2023-12-02 NOTE — Discharge Instructions (Addendum)
While you are in the emergency room, you had blood work done that was normal.  Your chest x-ray was normal.  Sometimes cough can be caused by heartburn.  Like to start taking famotidine each day.  You can take this with all of your other medications.  Please follow-up with your primary care doctor within 1 to 2 weeks to discuss cough.

## 2023-12-21 NOTE — Progress Notes (Signed)
 Office Visit Note  Patient: Victoria Sharp             Date of Birth: 02/25/1941           MRN: [id number]             PCP: Andrew Truman GRADE., MD Referring: Andrew Truman GRADE.,* Visit Date: 01/04/2024 Occupation: @GUAROCC @  Subjective:  Medication management   History of Present Illness: Victoria Sharp is a 82 y.o. female with seropositive rheumatoid arthritis and osteoarthritis.  She states that Enbrel  is not covered by her insurance anymore.  She has 2 more shots left which she plans to take every 4 weeks and will finish them in February.  She wants to discuss different treatment options.  She has not had a flare of rheumatoid arthritis in a long time.  She denies any morning stiffness.  She denies any joint pain or joint swelling.  She denies any lower back pain today.    Activities of Daily Living:  Patient reports morning stiffness for 0 minute.   Patient Denies nocturnal pain.  Difficulty dressing/grooming: Denies Difficulty climbing stairs: Denies Difficulty getting out of chair: Denies Difficulty using hands for taps, buttons, cutlery, and/or writing: Denies  Review of Systems  Constitutional:  Negative for fatigue.  HENT:  Negative for mouth sores and mouth dryness.   Eyes:  Positive for dryness.  Respiratory:  Negative for shortness of breath.   Cardiovascular:  Negative for chest pain and palpitations.  Gastrointestinal:  Negative for blood in stool, constipation and diarrhea.  Endocrine: Negative for increased urination.  Genitourinary:  Negative for involuntary urination.  Musculoskeletal:  Positive for muscle tenderness. Negative for joint pain, gait problem, joint pain, joint swelling, myalgias, muscle weakness, morning stiffness and myalgias.  Skin:  Positive for rash. Negative for color change, hair loss and sensitivity to sunlight.  Allergic/Immunologic: Negative for susceptible to infections.  Neurological:  Negative for dizziness and  headaches.  Hematological:  Negative for swollen glands.  Psychiatric/Behavioral:  Negative for depressed mood and sleep disturbance. The patient is not nervous/anxious.     PMFS History:  Patient Active Problem List   Diagnosis Date Noted   Spondylolisthesis of lumbar region 03/07/2022   TIA (transient ischemic attack) 01/27/2021   Rheumatoid arthritis with rheumatoid factor of multiple sites without organ or systems involvement (HCC) 06/26/2020   Essential hypertension 06/26/2020   MVP (mitral valve prolapse) 06/26/2020   History of Barrett's esophagus 06/26/2020   History of gastroesophageal reflux (GERD) 06/26/2020   History of breast cancer 06/26/2020   High risk medication use 06/26/2020    Past Medical History:  Diagnosis Date   Cancer (HCC)    Dermatitis    History of breast cancer    History of diverticulitis    Hypertension    Migraines    PONV (postoperative nausea and vomiting)    Rheumatoid arthritis (HCC)    Sacroiliitis (HCC)    right side   Tachycardia    TIA (transient ischemic attack) 01/2020   questionable TIA vs migraine per neurologist    Family History  Problem Relation Age of Onset   Heart attack Father    Kidney disease Brother    Cancer Brother    Past Surgical History:  Procedure Laterality Date   ABDOMINAL HYSTERECTOMY     ABLATION  08/2021   Dr. Eldonna   BREAST LUMPECTOMY Left 1998   NEUROMA SURGERY Right 03/2020   TRANSFORAMINAL LUMBAR INTERBODY FUSION W/ MIS 1  LEVEL N/A 03/07/2022   Procedure: LUMBAR FOUR- FIVE,  MINIMALLY  INVASIVE TRANSFORAMINAL LUMBAR  INTERBODY FUSION  w/ METRIX;  Surgeon: Cheryle Debby LABOR, MD;  Location: [facility] OR;  Service: Neurosurgery;  Laterality: N/A;  3C/RM 21   WEDGE RESECTION     Social History   Social History Narrative   Lives with husband in home   Right Handed   Drinks coffee for caffeine   Immunization History  Administered Date(s) Administered   Moderna Covid-19 Fall Seasonal Vaccine 71yrs &  older 10/07/2022   PFIZER(Purple Top)SARS-COV-2 Vaccination 01/09/2020, 01/30/2020, 09/11/2020, 10/18/2021     Objective: Vital Signs: BP (!) 155/90 (BP Location: Left Arm, Patient Position: Sitting, Cuff Size: Normal)   Pulse 73   Resp 14   Ht 5' 2 (1.575 m)   Wt 152 lb (68.9 kg)   BMI 27.80 kg/m    Physical Exam Vitals and nursing note reviewed.  Constitutional:      Appearance: She is well-developed.  HENT:     Head: Normocephalic and atraumatic.  Eyes:     Conjunctiva/sclera: Conjunctivae normal.  Cardiovascular:     Rate and Rhythm: Normal rate and regular rhythm.     Heart sounds: Normal heart sounds.  Pulmonary:     Effort: Pulmonary effort is normal.     Breath sounds: Normal breath sounds.  Abdominal:     General: Bowel sounds are normal.     Palpations: Abdomen is soft.  Musculoskeletal:     Cervical back: Normal range of motion.  Lymphadenopathy:     Cervical: No cervical adenopathy.  Skin:    General: Skin is warm and dry.     Capillary Refill: Capillary refill takes less than 2 seconds.  Neurological:     Mental Status: She is alert and oriented to person, place, and time.  Psychiatric:        Behavior: Behavior normal.      Musculoskeletal Exam: Cervical, thoracic and lumbar spine 1 good range of motion.  She had no tenderness over thoracic or lumbar spine.  Shoulders, elbows, wrist joints were in good range of motion.  She had bilateral DIP and PIP thickening.  No synovitis was noted.  Hip joints and knee joints were in good range of motion.  No warmth swelling or effusion was noted.  There was no tenderness over ankles or MTPs.  CDAI Exam: CDAI Score: -- Patient Global: 0 / 100; Provider Global: 0 / 100 Swollen: --; Tender: -- Joint Exam 01/04/2024   No joint exam has been documented for this visit   There is currently no information documented on the homunculus. Go to the Rheumatology activity and complete the homunculus joint  exam.  Investigation: No additional findings.  Imaging: No results found.   Recent Labs: Lab Results  Component Value Date   WBC 6.3 12/02/2023   HGB 14.3 12/02/2023   PLT 270 12/02/2023   NA 135 12/02/2023   K 4.2 12/02/2023   CL 105 12/02/2023   CO2 23 12/02/2023   GLUCOSE 102 (H) 12/02/2023   BUN 16 12/02/2023   CREATININE 0.95 12/02/2023   BILITOT 0.8 12/02/2023   ALKPHOS 81 12/02/2023   AST 23 12/02/2023   ALT 14 12/02/2023   PROT 7.2 12/02/2023   ALBUMIN 3.9 12/02/2023   CALCIUM 9.3 12/02/2023   GFRAA 62 06/09/2021   QFTBGOLDPLUS NEGATIVE 07/31/2023    Speciality Comments: Methotrexate-diarrhea, Arava -dermatitis Enbrel  08/2020  Procedures:  No procedures performed Allergies: Contrast media [iodinated contrast media],  Latex, Leflunomide , Methotrexate derivatives, Metronidazole , and Sulfamethoxazole-trimethoprim   Assessment / Plan:     Visit Diagnoses: Rheumatoid arthritis with rheumatoid factor of multiple sites without organ or systems involvement (HCC) - Positive RF, positive anti-CCP, positive ANA, synovitis: Patient has been doing well on Enbrel  50 mg subcu every 21 days.  She has been on it for last 3 years now.  She has had no flares since she has been on Enbrel .  Due to insurance coverage she would not be able to continue Enbrel .  She wants to switch to a generic oral medication.  She has tried methotrexate in the past but could not tolerated due to diarrhea.  She took leflunomide  in the past and developed a rash.  Although she did not notice any improvement in the rash after stopping leflunomide .  She seen a dermatologist since then and has been diagnosed with eczema.  She uses topical agents.  I did detailed discussion with the patient regarding possible option of using hydroxychloroquine or leflunomide .  She will try to go back on leflunomide .  Side effects of leflunomide  were discussed at length.  A handout was given and consent was taken.  The plan is to  start her on leflunomide  10 mg p.o. daily in April 2025 after she finishes to more subcu injections of Enbrel  which she has with her.  Patient will contact us  in April for prescription of leflunomide .  Medication counseling:   Patient was counseled on the purpose, proper use, and adverse effects of leflunomide  including risk of infection, nausea/diarrhea/weight loss, increase in blood pressure, rash, hair loss, tingling in the hands and feet, and signs and symptoms of interstitial lung disease.   Also counseled on Black Box warning of liver injury and importance of avoiding alcohol  while on therapy. Discussed that there is the possibility of an increased risk of malignancy but it is not well understood if this increased risk is due to the medication or the disease state.  Counseled patient to avoid live vaccines. Recommend annual influenza, Pneumovax 23, Prevnar 13, and Shingrix as indicated.   Discussed the importance of frequent monitoring of liver function and blood count.  Standing orders placed.   Provided patient with educational materials on leflunomide  and answered all questions.  Patient consented to Arava  use, and consent will be uploaded into the media tab.    High risk medication use - Enbrel  50 mg subcu every 21 days (started September 24, 2020) patient will finish the course of Enbrel  in March 2025.  She would not be able to continue Enbrel  due to insurance coverage.  She will be starting leflunomide  in April.  She will have labs in 2 weeks after starting leflunomide  and then every 3 months.  Information on immunization was placed in the AVS.  She was advised to hold Enbrel  or leflunomide  if she develops an infection resume after the infection resolves.  Primary osteoarthritis of both hands-she has bilateral PIP and DIP thickening.  No synovitis was noted.  Primary osteoarthritis of both feet-she denies any discomfort in her feet today.  She had bilateral MTP PIP and DIP  thickening.  Other secondary scoliosis, lumbar region  Chronic low back pain, unspecified back pain laterality, unspecified whether sciatica present - Status post fusion March 07, 2022.  She had good mobility without any discomfort today.  Positive ANA (antinuclear antibody)-has no clinical features of lupus.  Osteopenia of multiple sites - May 02, 2023 DEXA left femoral neck T-score -1.8, BMD 0.794 g/cm.  Use  of calcium rich diet and vitamin D was discussed.  Vitamin D deficiency-she takes vitamin D supplement.  Other medical problems are listed as follows:  MVP (mitral valve prolapse)  Essential hypertension  History of Barrett's esophagus  History of gastroesophageal reflux (GERD)  History of breast cancer  Orders: No orders of the defined types were placed in this encounter.  No orders of the defined types were placed in this encounter.   Follow-Up Instructions: Return in about 4 months (around 05/03/2024) for Rheumatoid arthritis, Osteoarthritis.   Maya Nash, MD  Note - This record has been created using Animal nutritionist.  Chart creation errors have been sought, but may not always  have been located. Such creation errors do not reflect on  the standard of medical care.

## 2023-12-28 ENCOUNTER — Telehealth: Payer: Self-pay | Admitting: Rheumatology

## 2023-12-28 NOTE — Telephone Encounter (Signed)
 Returned call to patient. Advised patient she is not due for labs. Patient had  her last CBC and CMP on 12/02/2023 and her last TB Gold 07/31/2023. Patient has had a lipid panel within the last year. Patient advised she will be due for labs at the beginning of March. Patient expressed understanding.

## 2023-12-28 NOTE — Telephone Encounter (Signed)
 Patient requested a return call to let her know if she is due for labwork before her 01/04/24 appointment.

## 2024-01-04 ENCOUNTER — Telehealth: Payer: Self-pay | Admitting: Pharmacist

## 2024-01-04 ENCOUNTER — Ambulatory Visit: Payer: Medicare Other | Attending: Rheumatology | Admitting: Rheumatology

## 2024-01-04 ENCOUNTER — Encounter: Payer: Self-pay | Admitting: Rheumatology

## 2024-01-04 VITALS — BP 155/90 | HR 73 | Resp 14 | Ht 62.0 in | Wt 152.0 lb

## 2024-01-04 DIAGNOSIS — Z8719 Personal history of other diseases of the digestive system: Secondary | ICD-10-CM | POA: Diagnosis present

## 2024-01-04 DIAGNOSIS — Z79899 Other long term (current) drug therapy: Secondary | ICD-10-CM | POA: Diagnosis present

## 2024-01-04 DIAGNOSIS — Z853 Personal history of malignant neoplasm of breast: Secondary | ICD-10-CM | POA: Insufficient documentation

## 2024-01-04 DIAGNOSIS — R768 Other specified abnormal immunological findings in serum: Secondary | ICD-10-CM | POA: Insufficient documentation

## 2024-01-04 DIAGNOSIS — M19072 Primary osteoarthritis, left ankle and foot: Secondary | ICD-10-CM | POA: Insufficient documentation

## 2024-01-04 DIAGNOSIS — M4156 Other secondary scoliosis, lumbar region: Secondary | ICD-10-CM | POA: Diagnosis present

## 2024-01-04 DIAGNOSIS — L308 Other specified dermatitis: Secondary | ICD-10-CM | POA: Insufficient documentation

## 2024-01-04 DIAGNOSIS — I341 Nonrheumatic mitral (valve) prolapse: Secondary | ICD-10-CM | POA: Diagnosis present

## 2024-01-04 DIAGNOSIS — M19071 Primary osteoarthritis, right ankle and foot: Secondary | ICD-10-CM | POA: Insufficient documentation

## 2024-01-04 DIAGNOSIS — M19042 Primary osteoarthritis, left hand: Secondary | ICD-10-CM | POA: Insufficient documentation

## 2024-01-04 DIAGNOSIS — M8589 Other specified disorders of bone density and structure, multiple sites: Secondary | ICD-10-CM | POA: Diagnosis present

## 2024-01-04 DIAGNOSIS — I10 Essential (primary) hypertension: Secondary | ICD-10-CM | POA: Diagnosis present

## 2024-01-04 DIAGNOSIS — M545 Low back pain, unspecified: Secondary | ICD-10-CM | POA: Diagnosis present

## 2024-01-04 DIAGNOSIS — M0579 Rheumatoid arthritis with rheumatoid factor of multiple sites without organ or systems involvement: Secondary | ICD-10-CM | POA: Insufficient documentation

## 2024-01-04 DIAGNOSIS — E559 Vitamin D deficiency, unspecified: Secondary | ICD-10-CM | POA: Insufficient documentation

## 2024-01-04 DIAGNOSIS — G8929 Other chronic pain: Secondary | ICD-10-CM | POA: Insufficient documentation

## 2024-01-04 DIAGNOSIS — M19041 Primary osteoarthritis, right hand: Secondary | ICD-10-CM | POA: Diagnosis present

## 2024-01-04 NOTE — Telephone Encounter (Signed)
 Patient's d/cing Enbrel .  Starting leflunomide  10mg  daily (low dose as maintenance) in April 2025. She will call when ready to start but routing as FYI. Recommend hse update CBC/CMP before starting so we have UTD baseline   She is to follow-up with Dr. Dolphus in 6 weeks after starting leflunomide   Sherry Pennant, PharmD, MPH, BCPS, CPP Clinical Pharmacist (Rheumatology and Pulmonology)

## 2024-01-04 NOTE — Patient Instructions (Signed)
 Leflunomide  Tablets What is this medication? LEFLUNOMIDE  (le FLOO na mide) treats the symptoms of rheumatoid arthritis. It works by slowing down an overactive immune system. This decreases inflammation. It belongs to a group of medications called DMARDs. This medicine may be used for other purposes; ask your health care provider or pharmacist if you have questions. COMMON BRAND NAME(S): Arava  What should I tell my care team before I take this medication? They need to know if you have any of these conditions: Cancer Diabetes High blood pressure Immune system problems Infection Kidney disease Liver disease Low blood cell levels (white cells, red cells, and platelets) Lung or breathing disease, such as asthma or COPD Recent or upcoming vaccine Skin conditions Tingling of the fingers or toes, or other nerve disorder An unusual or allergic reaction to leflunomide , other medications, food, dyes, or preservatives Pregnant or trying to get pregnant Breastfeeding How should I use this medication? Take this medication by mouth with a full glass of water. Take it as directed on the prescription label at the same time every day. Keep taking it unless your care team tells you to stop. Talk to your care team about the use of this medication in children. Special care may be needed. Overdosage: If you think you have taken too much of this medicine contact a poison control center or emergency room at once. NOTE: This medicine is only for you. Do not share this medicine with others. What if I miss a dose? If you miss a dose, take it as soon as you can. If it is almost time for your next dose, take only that dose. Do not take double or extra doses. What may interact with this medication? Do not take this medication with any of the following: Teriflunomide This medication may also interact with the following: Alosetron Caffeine Cefaclor Certain medications for diabetes, such as nateglinide,  repaglinide, rosiglitazone, pioglitazone Certain medications for high cholesterol, such as atorvastatin, pravastatin, rosuvastatin, simvastatin Charcoal Cholestyramine Ciprofloxacin  Duloxetine Estrogen and progestin hormones Furosemide Ketoprofen Live virus vaccines Medications that increase your risk for infection Methotrexate Mitoxantrone Paclitaxel Penicillin Theophylline Tizanidine Warfarin This list may not describe all possible interactions. Give your health care provider a list of all the medicines, herbs, non-prescription drugs, or dietary supplements you use. Also tell them if you smoke, drink alcohol , or use illegal drugs. Some items may interact with your medicine. What should I watch for while using this medication? Visit your care team for regular checks on your progress. Tell your care team if your symptoms do not start to get better or if they get worse. You may need blood work done while you are taking this medication. This medication may cause serious skin reactions. They can happen weeks to months after starting the medication. Contact your care team right away if you notice fevers or flu-like symptoms with a rash. The rash may be red or purple and then turn into blisters or peeling of the skin. You may also notice a red rash with swelling of the face, lips, or lymph nodes in your neck or under your arms. You should not receive certain vaccines during your treatment and for a certain time after your treatment with this medication ends. Talk to your care team for more information. This medication may stay in your body for up to 2 years after your last dose. Tell your care team about any unusual side effects or symptoms. A medication can be given to help lower your blood levels of this  medication more quickly. Talk to your care team if you may be pregnant. This medication can cause serious birth defects if taken during pregnancy and for a while after the last dose. You will  need a negative pregnancy test before starting this medication. Contraception is recommended while taking this medication and for a while after the last dose. Your care team can help you find the option that works for you. Do not breastfeed while taking this medication. What side effects may I notice from receiving this medication? Side effects that you should report to your care team as soon as possible: Allergic reactions--skin rash, itching, hives, swelling of the face, lips, tongue, or throat Dry cough, shortness of breath or trouble breathing Increase in blood pressure Infection--fever, chills, cough, sore throat, wounds that don't heal, pain or trouble when passing urine, general feeling of discomfort or being unwell Redness, blistering, peeling, or loosening of the skin, including inside the mouth Liver injury--right upper belly pain, loss of appetite, nausea, light-colored stool, dark yellow or brown urine, yellowing skin or eyes, unusual weakness or fatigue Pain, tingling, or numbness in the hands or feet Unusual bruising or bleeding Side effects that usually do not require medical attention (report to your care team if they continue or are bothersome): Back pain Diarrhea Hair loss Headache Nausea This list may not describe all possible side effects. Call your doctor for medical advice about side effects. You may report side effects to FDA at 1-800-FDA-1088. Where should I keep my medication? Keep out of the reach of children and pets. Store at room temperature between 20 and 25 degrees C (68 and 77 degrees F). Protect from moisture and light. Keep the container tightly closed. Get rid of any unused medication after the expiration date. To get rid of medications that are no longer needed or have expired: Take the medication to a medication take-back program. Check with your pharmacy or law enforcement to find a location. If you cannot return the medication, ask your pharmacist or  care team how to get rid of this medication safely. NOTE: This sheet is a summary. It may not cover all possible information. If you have questions about this medicine, talk to your doctor, pharmacist, or health care provider.  2024 Elsevier/Gold Standard (2022-05-12 00:00:00)  Standing Labs We placed an order today for your standing lab work.   Please have your standing labs drawn in 2 weeks after starting leflunomide  and then every 3 months  Please have your labs drawn 2 weeks prior to your appointment so that the provider can discuss your lab results at your appointment, if possible.  Please note that you may see your imaging and lab results in MyChart before we have reviewed them. We will contact you once all results are reviewed. Please allow our office up to 72 hours to thoroughly review all of the results before contacting the office for clarification of your results.  WALK-IN LAB HOURS  Monday through Thursday from 8:00 am -12:30 pm and 1:00 pm-5:00 pm and Friday from 8:00 am-12:00 pm.  Patients with office visits requiring labs will be seen before walk-in labs.  You may encounter longer than normal wait times. Please allow additional time. Wait times may be shorter on  Monday and Thursday afternoons.  We do not book appointments for walk-in labs. We appreciate your patience and understanding with our staff.   Labs are drawn by [facility]. Please bring your co-pay at the time of your lab draw.  You may  receive a bill from [facility] for your lab work.  Please note if you are on Hydroxychloroquine and and an order has been placed for a Hydroxychloroquine level,  you will need to have it drawn 4 hours or more after your last dose.  If you wish to have your labs drawn at another location, please call the office 24 hours in advance so we can fax the orders.  The office is located at [address], [address], [place], [place] [address]   If you have any questions regarding directions or  hours of operation,  please call [phone].   As a reminder, please drink plenty of water prior to coming for your lab work. Thanks!   Vaccines You are taking a medication(s) that can suppress your immune system.  The following immunizations are recommended: Flu annually Covid-19  RSV Td/Tdap (tetanus, diphtheria, pertussis) every 10 years Pneumonia (Prevnar 15 then Pneumovax 23 at least 1 year apart.  Alternatively, can take Prevnar 20 without needing additional dose) Shingrix: 2 doses from 4 weeks to 6 months apart If you have signs or symptoms of an infection or start antibiotics: First, call your PCP for workup of your infection. Hold your medication through the infection, until you complete your antibiotics, and until symptoms resolve if you take the following: Injectable medication (Actemra, Benlysta, Cimzia, Cosentyx, Enbrel , Humira , Kevzara, Orencia, Remicade, Simponi, Stelara, Taltz, Tremfya) Methotrexate Leflunomide  (Arava ) Mycophenolate (Cellcept) Xeljanz, Olumiant, or Rinvoq   Please check with your PCP to make sure you are up to date.

## 2024-01-04 NOTE — Progress Notes (Signed)
 Pharmacy Note  Subjective: Patient presents today to the [facility] for follow up office visit.  Patient seen by the pharmacist for counseling on leflunomide  (Arava ) for rheumatoid arthritis. She is discontinuing Enbrel  (she has two doses left -> she'll take in February 2025 and March 2025)  Objective: CBC    Component Value Date/Time   WBC 6.3 12/02/2023 0949   RBC 4.73 12/02/2023 0949   HGB 14.3 12/02/2023 0949   HCT 43.4 12/02/2023 0949   PLT 270 12/02/2023 0949   MCV 91.8 12/02/2023 0949   MCH 30.2 12/02/2023 0949   MCHC 32.9 12/02/2023 0949   RDW 13.2 12/02/2023 0949   LYMPHSABS 1.8 12/02/2023 0949   MONOABS 0.5 12/02/2023 0949   EOSABS 0.9 (H) 12/02/2023 0949   BASOSABS 0.1 12/02/2023 0949    CMP     Component Value Date/Time   NA 135 12/02/2023 0949   K 4.2 12/02/2023 0949   CL 105 12/02/2023 0949   CO2 23 12/02/2023 0949   GLUCOSE 102 (H) 12/02/2023 0949   BUN 16 12/02/2023 0949   CREATININE 0.95 12/02/2023 0949   CREATININE 0.96 (H) 03/02/2023 0947   CALCIUM 9.3 12/02/2023 0949   PROT 7.2 12/02/2023 0949   ALBUMIN 3.9 12/02/2023 0949   AST 23 12/02/2023 0949   ALT 14 12/02/2023 0949   ALKPHOS 81 12/02/2023 0949   BILITOT 0.8 12/02/2023 0949   GFRNONAA >60 12/02/2023 0949   GFRNONAA 53 (L) 06/09/2021 0856   GFRAA 62 06/09/2021 0856    Baseline Immunosuppressant Therapy Labs    Latest Ref Rng & Units 07/31/2023   10:49 AM  Quantiferon TB Gold  Quantiferon TB Gold Plus NEGATIVE NEGATIVE        Latest Ref Rng & Units 06/26/2020    9:05 AM  Hepatitis  Hep B Surface Ag NON-REACTI NON-REACTIVE   Hep B IgM NON-REACTI NON-REACTIVE     Lab Results  Component Value Date   HIV NON-REACTIVE 06/26/2020       Latest Ref Rng & Units 06/26/2020    9:05 AM  Immunoglobulin Electrophoresis  IgA  70 - 320 mg/dL 716   IgG 399 - 8,459 mg/dL 117   IgM 50 - 699 mg/dL 40        Latest Ref Rng & Units 12/02/2023    9:49 AM  Serum Protein  Electrophoresis  Total Protein 6.5 - 8.1 g/dL 7.2     No results found for: G6PDH  No results found for: TPMT   Pregnancy status:  post-menopausal  Assessment/Plan:  Patient was counseled on the purpose, proper use, and adverse effects of leflunomide  including risk of infection, nausea/diarrhea/weight loss, increase in blood pressure, rash, hair loss, tingling in the hands and feet, and signs and symptoms of interstitial lung disease.   Also counseled on Black Box warning of liver injury and importance of avoiding alcohol  while on therapy. Discussed that there is the possibility of an increased risk of malignancy but it is not well understood if this increased risk is due to the medication or the disease state.  Counseled patient to avoid live vaccines. Recommend annual influenza, Pneumovax 23, Prevnar 13, and Shingrix as indicated.   Discussed the importance of frequent monitoring of liver function and blood count.  Standing orders placed.  Discussed importance of birth control while on leflunomide  due to risk of congenital abnormalities. Patient is postmenopausal.  Provided patient with educational materials on leflunomide  and answered all questions.  Patient consented to Arava  use,  and consent will be uploaded into the media tab.    Patient dose will be 10mg  daily if labs are stable.  Prescription pending patient to call us  when ready to start in 2 months  Sherry Pennant, PharmD, MPH, BCPS, CPP Clinical Pharmacist (Rheumatology and Pulmonology)

## 2024-03-13 ENCOUNTER — Telehealth: Payer: Self-pay | Admitting: Rheumatology

## 2024-03-13 NOTE — Telephone Encounter (Signed)
 Pt called stating she does not think she can start taking the leflunomide in April. Pt stated she does not like the side affects that come with that. Pt stated she heard her friend saying they liked humira and was interested in that. pT WOULD LIKE SOMEONE TO CALL HER AT [phone].

## 2024-03-13 NOTE — Telephone Encounter (Signed)
 She will need a follow up visit to discuss treatment options. Enbrel was discontinued due to changes with insurance so humira may not be covered

## 2024-03-13 NOTE — Telephone Encounter (Signed)
 I called patient, appt scheduled 03/20/2024.

## 2024-03-18 NOTE — Progress Notes (Unsigned)
 Office Visit Note  Patient: Victoria Sharp             Date of Birth: Nov 23, 1941           MRN: [id number]             PCP: Cheron Schaumann., MD Referring: Cheron Schaumann.,* Visit Date: 03/20/2024 Occupation: @GUAROCC @  Subjective: Discuss medication options   History of Present Illness: Victoria Sharp is a 83 y.o. female with history of seropositive rheumatoid arthritis.  Patient is prescribed Enbrel 50 mg sq every 21 days but no longer qualifies for patient assistance due to the changes of income threshold with [employer].  The plan was for her to transition to leflunomide but she is apprehensive because of the side effects.  According to the patient she has previously taken leflunomide and had a severe rash.  Patient would like to see if Humira will be an option for her. She denies any signs or symptoms of a rheumatoid arthritis flare.  She has clinically been doing well on Enbrel without any breakthrough symptoms while spacing the dosing to every 21 days.  She denies any joint swelling at this time.  Her last dose of ember was administered on 03/13/2024 so she would not be due for a dose of medication until 04/03/2024.    Activities of Daily Living:  Patient reports morning stiffness for 0 minute.   Patient Reports nocturnal pain.  Difficulty dressing/grooming: Denies Difficulty climbing stairs: Denies Difficulty getting out of chair: Denies Difficulty using hands for taps, buttons, cutlery, and/or writing: Reports  Review of Systems  Constitutional:  Negative for fatigue.  HENT:  Negative for mouth sores and mouth dryness.   Eyes:  Positive for dryness.  Respiratory:  Negative for shortness of breath.   Cardiovascular:  Negative for chest pain and palpitations.  Gastrointestinal:  Positive for diarrhea. Negative for blood in stool and constipation.  Endocrine: Negative for increased urination.  Genitourinary:  Negative for involuntary urination.   Musculoskeletal:  Positive for joint pain, joint pain and joint swelling. Negative for gait problem, myalgias, muscle weakness, morning stiffness, muscle tenderness and myalgias.  Skin:  Negative for color change, rash, hair loss and sensitivity to sunlight.  Allergic/Immunologic: Positive for susceptible to infections.  Neurological:  Negative for dizziness and headaches.  Hematological:  Negative for swollen glands.  Psychiatric/Behavioral:  Negative for depressed mood and sleep disturbance. The patient is not nervous/anxious.     PMFS History:  Patient Active Problem List   Diagnosis Date Noted   Spondylolisthesis of lumbar region 03/07/2022   TIA (transient ischemic attack) 01/27/2021   Rheumatoid arthritis with rheumatoid factor of multiple sites without organ or systems involvement (HCC) 06/26/2020   Essential hypertension 06/26/2020   MVP (mitral valve prolapse) 06/26/2020   History of Barrett's esophagus 06/26/2020   History of gastroesophageal reflux (GERD) 06/26/2020   History of breast cancer 06/26/2020   High risk medication use 06/26/2020    Past Medical History:  Diagnosis Date   Barrett's esophagus    Cancer (HCC)    Dermatitis    History of breast cancer    History of diverticulitis    Hypertension    Migraines    PONV (postoperative nausea and vomiting)    Rheumatoid arthritis (HCC)    Sacroiliitis (HCC)    right side   Tachycardia    TIA (transient ischemic attack) 01/2020   questionable TIA vs migraine per neurologist    Family History  Problem Relation Age of Onset   Heart attack Father    Kidney disease Brother    Cancer Brother    Past Surgical History:  Procedure Laterality Date   ABDOMINAL HYSTERECTOMY     ABLATION  08/2021   Dr. Alvester Morin   BREAST LUMPECTOMY Left 1998   NEUROMA SURGERY Right 03/2020   TRANSFORAMINAL LUMBAR INTERBODY FUSION W/ MIS 1 LEVEL N/A 03/07/2022   Procedure: LUMBAR FOUR- FIVE,  MINIMALLY  INVASIVE TRANSFORAMINAL LUMBAR   INTERBODY FUSION  w/ METRIX;  Surgeon: Jadene Pierini, MD;  Location: [facility] OR;  Service: Neurosurgery;  Laterality: N/A;  3C/RM 21   WEDGE RESECTION     Social History   Social History Narrative   Lives with husband in home   Right Handed   Drinks coffee for caffeine   Immunization History  Administered Date(s) Administered   Moderna Covid-19 Fall Seasonal Vaccine 58yrs & older 10/07/2022   PFIZER(Purple Top)SARS-COV-2 Vaccination 01/09/2020, 01/30/2020, 09/11/2020, 10/18/2021     Objective: Vital Signs: BP 128/84 (BP Location: Left Arm, Patient Position: Sitting, Cuff Size: Large)   Pulse 73   Resp 14   Ht 5\' 2"  (1.575 m)   Wt 149 lb (67.6 kg)   BMI 27.25 kg/m    Physical Exam Vitals and nursing note reviewed.  Constitutional:      Appearance: She is well-developed.  HENT:     Head: Normocephalic and atraumatic.  Eyes:     Conjunctiva/sclera: Conjunctivae normal.  Cardiovascular:     Rate and Rhythm: Normal rate and regular rhythm.     Heart sounds: Normal heart sounds.  Pulmonary:     Effort: Pulmonary effort is normal.     Breath sounds: Normal breath sounds.  Abdominal:     General: Bowel sounds are normal.     Palpations: Abdomen is soft.  Musculoskeletal:     Cervical back: Normal range of motion.  Lymphadenopathy:     Cervical: No cervical adenopathy.  Skin:    General: Skin is warm and dry.     Capillary Refill: Capillary refill takes less than 2 seconds.  Neurological:     Mental Status: She is alert and oriented to person, place, and time.  Psychiatric:        Behavior: Behavior normal.      Musculoskeletal Exam: C-spine, thoracic spine, lumbar spine and good range of motion.  Shoulder joints, elbow joints, wrist joints, MCPs, PIPs, DIPs have good range of motion with no synovitis.  Complete fist formation bilaterally.  PIP and DIP thickening.  Hip joints have good range of motion with no groin pain.  Knee joints have good range of motion no  warmth or effusion.  Ankle joints have good range of motion without tenderness or joint swelling.  CDAI Exam: CDAI Score: -- Patient Global: --; Provider Global: -- Swollen: --; Tender: -- Joint Exam 03/20/2024   No joint exam has been documented for this visit   There is currently no information documented on the homunculus. Go to the Rheumatology activity and complete the homunculus joint exam.  Investigation: No additional findings.  Imaging: No results found.  Recent Labs: Lab Results  Component Value Date   WBC 6.3 12/02/2023   HGB 14.3 12/02/2023   PLT 270 12/02/2023   NA 135 12/02/2023   K 4.2 12/02/2023   CL 105 12/02/2023   CO2 23 12/02/2023   GLUCOSE 102 (H) 12/02/2023   BUN 16 12/02/2023   CREATININE 0.95 12/02/2023   BILITOT  0.8 12/02/2023   ALKPHOS 81 12/02/2023   AST 23 12/02/2023   ALT 14 12/02/2023   PROT 7.2 12/02/2023   ALBUMIN 3.9 12/02/2023   CALCIUM 9.3 12/02/2023   GFRAA 62 06/09/2021   QFTBGOLDPLUS NEGATIVE 07/31/2023    Speciality Comments: Methotrexate-diarrhea, Arava-dermatitis Enbrel 08/2020  Procedures:  No procedures performed Allergies: Contrast media [iodinated contrast media], Latex, Leflunomide, Methotrexate derivatives, Metronidazole, and Sulfamethoxazole-trimethoprim   Assessment / Plan:     Visit Diagnoses: Rheumatoid arthritis with rheumatoid factor of multiple sites without organ or systems involvement (HCC) - Positive RF, positive anti-CCP, positive ANA, synovitis: She has no synovitis on examination today.  She has not had any signs or symptoms of a rheumatoid arthritis flare.  She has clinically been doing well on Enbrel 50 mg subcu days injections every 21 days.  She has been able to space Enbrel without any breakthrough symptoms.  Unfortunately she no longer qualifies for patient assistance that she presents today to discuss treatment alternatives.  The use of leflunomide was discussed at her last office visit but she is  apprehensive to resume leflunomide due to experiencing a rash while previously taking it.  She would like to pursue applying for Humira.  Indications, contraindications, potential side effects of Humira were discussed today in detail.  All questions were addressed and consent was obtained.  Discussed that if she does not qualify for Humira she may need to try hydroxychloroquine.  Also reviewed the indications, contraindications, and potential side effects of hydroxychloroquine today in detail.  All questions were addressed and consent was obtained.  Patient will be notified when and if Humira is approved through her insurance.  If Humira is approved she will remain on Humira as monotherapy.  Patient will likely inject Humira every 21 days since her disease has been so well-controlled spacing the dosing of Enbrel. She will follow up in 3 months or sooner if needed.   Counseled patient that Humira is a TNF blocking agent.  Counseled patient on purpose, proper use, and adverse effects of Humira.  Reviewed the most common adverse effects including infections, headache, and injection site reactions. Discussed that there is the possibility of an increased risk of malignancy including non-melanoma skin cancer but it is not well understood if this increased risk is due to the medication or the disease state.  Advised patient to get yearly dermatology exams due to risk of skin cancer. Counseled patient that Humira should be held prior to scheduled surgery.  Counseled patient to avoid live vaccines while on Humira.  Recommend annual influenza, PCV 15 or PCV20 or Pneumovax 23, and Shingrix as indicated.  Reviewed the importance of regular labs while on Humira therapy.  Will monitor CBC and CMP 1 month after starting and then every 3 months routinely thereafter. Will monitor TB gold annually. Standing orders placed.    Provided patient with medication education material and answered all questions.  Patient consented to  Humira.  Will upload consent into the media tab.  Reviewed storage instructions of Humira.  Advised initial injection must be administered in office.  Patient verbalized understanding.   Dose will be for rheumatoid arthritis Humira 40 mg every 14 days.  Prescription pending lab results and/or insurance approval.  High risk medication use -Last dose of Enbrel administered on 03/13/2024-- Enbrel 50 mg sq every 21 days (started September 24, 2020)--no longer qualifies for patient assistance. Plan to apply for Humira 40 mg sq injections every 14 days--patient plans on spacing Humira to every  21 days if approved.   If she does not qualify for patient assistance discussed the option of initiating hydroxychloroquine-consent obtained today.  CBC and CMP updated on 12/02/23.  Orders for CBC and CMP released today.  TB gold negative 07/31/23.   She was advised to hold Humira if she develops signs or symptoms of an infection and to resume once the infection has completely cleared.    - Plan: CBC with Differential/Platelet, COMPLETE METABOLIC PANEL WITH GFR  Primary osteoarthritis of both hands: She has PIP and DIP thickening consistent with osteoarthritis of both hands.  No synovitis noted on examination today.  Primary osteoarthritis of both feet: She has intermittent discomfort in her feet due to underlying osteoarthritis but no active inflammation.  Good ROM of both ankles with no discomfort or inflammation.   Other secondary scoliosis, lumbar region:Status post fusion March 07, 2022.   Chronic low back pain, unspecified back pain laterality, unspecified whether sciatica present: Status post fusion March 07, 2022.   Positive ANA (antinuclear antibody): No clinical features of systemic lupus.  Other eczema: Diagnosed by dermatology.   Osteopenia of multiple sites - 05/02/2023 DEXA left femoral neck T-score -1.8, BMD 0.794 g/cm.  She is taking a calcium and vitamin D supplement daily.  Due to update DEXA in  May 2026.   Vitamin D deficiency: She is taking vitamin D 1000 units daily.   Other medical conditions are listed as follows:  Essential hypertension: Blood pressure was 128/84 today in the office.  MVP (mitral valve prolapse)  History of Barrett's esophagus  History of gastroesophageal reflux (GERD)  History of breast cancer  Orders: Orders Placed This Encounter  Procedures   CBC with Differential/Platelet   COMPLETE METABOLIC PANEL WITH GFR   No orders of the defined types were placed in this encounter.    Follow-Up Instructions: Return in about 3 months (around 06/20/2024) for Rheumatoid arthritis.   Gearldine Bienenstock, PA-C  Note - This record has been created using Dragon software.  Chart creation errors have been sought, but may not always  have been located. Such creation errors do not reflect on  the standard of medical care.

## 2024-03-20 ENCOUNTER — Ambulatory Visit: Attending: Physician Assistant | Admitting: Physician Assistant

## 2024-03-20 ENCOUNTER — Encounter: Payer: Self-pay | Admitting: Physician Assistant

## 2024-03-20 VITALS — BP 128/84 | HR 73 | Resp 14 | Ht 62.0 in | Wt 149.0 lb

## 2024-03-20 DIAGNOSIS — M545 Low back pain, unspecified: Secondary | ICD-10-CM | POA: Diagnosis present

## 2024-03-20 DIAGNOSIS — M19042 Primary osteoarthritis, left hand: Secondary | ICD-10-CM | POA: Insufficient documentation

## 2024-03-20 DIAGNOSIS — M4156 Other secondary scoliosis, lumbar region: Secondary | ICD-10-CM | POA: Insufficient documentation

## 2024-03-20 DIAGNOSIS — Z853 Personal history of malignant neoplasm of breast: Secondary | ICD-10-CM | POA: Insufficient documentation

## 2024-03-20 DIAGNOSIS — Z79899 Other long term (current) drug therapy: Secondary | ICD-10-CM | POA: Insufficient documentation

## 2024-03-20 DIAGNOSIS — M19071 Primary osteoarthritis, right ankle and foot: Secondary | ICD-10-CM | POA: Insufficient documentation

## 2024-03-20 DIAGNOSIS — G8929 Other chronic pain: Secondary | ICD-10-CM | POA: Diagnosis present

## 2024-03-20 DIAGNOSIS — E559 Vitamin D deficiency, unspecified: Secondary | ICD-10-CM | POA: Diagnosis present

## 2024-03-20 DIAGNOSIS — M0579 Rheumatoid arthritis with rheumatoid factor of multiple sites without organ or systems involvement: Secondary | ICD-10-CM | POA: Diagnosis present

## 2024-03-20 DIAGNOSIS — R768 Other specified abnormal immunological findings in serum: Secondary | ICD-10-CM | POA: Diagnosis present

## 2024-03-20 DIAGNOSIS — M8589 Other specified disorders of bone density and structure, multiple sites: Secondary | ICD-10-CM | POA: Diagnosis present

## 2024-03-20 DIAGNOSIS — I10 Essential (primary) hypertension: Secondary | ICD-10-CM | POA: Insufficient documentation

## 2024-03-20 DIAGNOSIS — M19072 Primary osteoarthritis, left ankle and foot: Secondary | ICD-10-CM | POA: Diagnosis present

## 2024-03-20 DIAGNOSIS — I341 Nonrheumatic mitral (valve) prolapse: Secondary | ICD-10-CM | POA: Insufficient documentation

## 2024-03-20 DIAGNOSIS — L308 Other specified dermatitis: Secondary | ICD-10-CM | POA: Diagnosis present

## 2024-03-20 DIAGNOSIS — M19041 Primary osteoarthritis, right hand: Secondary | ICD-10-CM | POA: Insufficient documentation

## 2024-03-20 DIAGNOSIS — Z8719 Personal history of other diseases of the digestive system: Secondary | ICD-10-CM | POA: Insufficient documentation

## 2024-03-20 NOTE — Patient Instructions (Signed)
 Adalimumab Injection What is this medication? ADALIMUMAB (ay da LIM yoo mab) treats autoimmune conditions, such as psoriasis, arthritis, Crohn disease, and ulcerative colitis. It works by slowing down an overactive immune system.  It may also be used to treat hidradenitis suppurativa (HS). HS is a condition that causes painful lumps under the skin in areas such as the armpits and groin. It belongs to a group of medications called TNF inhibitors. It is a monoclonal antibody. This medicine may be used for other purposes; ask your health care provider or pharmacist if you have questions. COMMON BRAND NAME(S): ABRILADA, AMJEVITA, CYLTEZO, HADLIMA, Hulio, Hulio PEN, Humira, HUMIRA PEN, Hyrimoz, Idacio, Simlandi, Yuflyma, YUSIMRY What should I tell my care team before I take this medication? They need to know if you have any of these conditions: Cancer Diabetes (high blood sugar) Having surgery Heart disease Hepatitis B Immune system problems Infections, such as tuberculosis (TB) or other bacterial, fungal, or viral infections Multiple sclerosis Recent or upcoming vaccine An unusual or allergic reaction to adalimumab, mannitol, latex, rubber, other medications, foods, dyes, or preservatives Pregnant or trying to get pregnant Breast-feeding How should I use this medication? This medication is injected under the skin. It may be given by your care team in a hospital or clinic setting. It may also be given at home. If you get this medication at home, you will be taught how to prepare and give it. Use exactly as directed. Take it as directed on the prescription label. Keep taking it unless your care team tells you to stop. This medication comes with INSTRUCTIONS FOR USE. Ask your pharmacist for directions on how to use this medication. Read the information carefully. Talk to your pharmacist or care team if you have questions. It is important that you put your used needles and syringes in a special sharps  container. Do not put them in a trash can. If you do not have a sharps container, call your pharmacist or care team to get one. A special MedGuide will be given to you by the pharmacist with each prescription and refill. If you are getting this medication in a hospital or clinic, a special MedGuide will be given to you before each treatment. Be sure to read this information carefully each time. Talk to your care team about the use of this medication in children. While it be prescribed for children as young as 2 years for selected conditions, precautions do apply. Overdosage: If you think you have taken too much of this medicine contact a poison control center or emergency room at once. NOTE: This medicine is only for you. Do not share this medicine with others. What if I miss a dose? If you get this medication at the hospital or clinic: it is important not to miss your dose. Call your care team if you are unable to keep an appointment. If you give yourself this medication at home: If you miss a dose, take it as soon as you can. If it is almost time for your next dose, take only that dose. Do not take double or extra doses. Call your care team with questions. What may interact with this medication? Do not take this medication with any of the following: Abatacept Anakinra Biologic medications, such as certolizumab, etanercept, golimumab, infliximab Live virus vaccines This medication may also interact with the following: Cyclosporine Theophylline Vaccines Warfarin This list may not describe all possible interactions. Give your health care provider a list of all the medicines, herbs, non-prescription drugs,  or dietary supplements you use. Also tell them if you smoke, drink alcohol, or use illegal drugs. Some items may interact with your medicine. What should I watch for while using this medication? Visit your care team for regular checks on your progress. Tell your care team if your symptoms do not  start to get better or if they get worse. You will be tested for tuberculosis (TB) before you start this medication. If your care team prescribes any medication for TB, you should start taking the TB medication before starting this medication. Make sure to finish the full course of TB medication. This medication may increase your risk of getting an infection. Call your care team for advice if you get a fever, chills, sore throat, or other symptoms of a cold or flu. Do not treat yourself. Try to avoid being around people who are sick. Talk to your care team about your risk of cancer. You may be more at risk for certain types of cancer if you take this medication. What side effects may I notice from receiving this medication? Side effects that you should report to your care team as soon as possible: Allergic reactions--skin rash, itching, hives, swelling of the face, lips, tongue, or throat Aplastic anemia--unusual weakness or fatigue, dizziness, headache, trouble breathing, increased bleeding or bruising Body pain, tingling, or numbness Heart failure--shortness of breath, swelling of the ankles, feet, or hands, sudden weight gain, unusual weakness or fatigue Infection--fever, chills, cough, sore throat, wounds that don't heal, pain or trouble when passing urine, general feeling of discomfort or being unwell Lupus-like syndrome--joint pain, swelling, or stiffness, butterfly-shaped rash on the face, rashes that get worse in the sun, fever, unusual weakness or fatigue Unusual bruising or bleeding Side effects that usually do not require medical attention (report to your care team if they continue or are bothersome): Headache Nausea Pain, redness, or irritation at injection site Runny or stuffy nose Sore throat Stomach pain This list may not describe all possible side effects. Call your doctor for medical advice about side effects. You may report side effects to FDA at 1-800-FDA-1088. Where should I  keep my medication? Keep out of the reach of children and pets. Store in the refrigerator. Do not freeze. Keep this medication in the original packaging until you are ready to take it. Protect from light. Get rid of any unused medication after the expiration date. This medication may be stored at room temperature for up to 14 days. Keep this medication in the original packaging. Protect from light. If it is stored at room temperature, get rid of any unused medication after 14 days or after it expires, whichever is first. To get rid of medications that are no longer needed or have expired: Take the medication to a medication take-back program. Check with your pharmacy or law enforcement to find a location. If you cannot return the medication, ask your pharmacist or care team how to get rid of this medication safely. NOTE: This sheet is a summary. It may not cover all possible information. If you have questions about this medicine, talk to your doctor, pharmacist, or health care provider.  2024 Elsevier/Gold Standard (2023-11-24 00:00:00)   Hydroxychloroquine Tablets What is this medication? HYDROXYCHLOROQUINE (hye drox ee KLOR oh kwin) treats autoimmune conditions, such as rheumatoid arthritis and lupus. It works by slowing down an overactive immune system. It may also be used to prevent and treat malaria. It works by killing the parasite that causes malaria. It belongs to a  group of medications called DMARDs. This medicine may be used for other purposes; ask your health care provider or pharmacist if you have questions. COMMON BRAND NAME(S): Plaquenil, Quineprox, SOVUNA What should I tell my care team before I take this medication? They need to know if you have any of these conditions: Diabetes Eye disease, vision problems Frequently drink alcohol G6PD deficiency Heart disease Irregular heartbeat or rhythm Kidney disease Liver disease Porphyria Psoriasis An unusual or allergic reaction  to hydroxychloroquine, other medications, foods, dyes, or preservatives Pregnant or trying to get pregnant Breastfeeding How should I use this medication? Take this medication by mouth with water. Take it as directed on the prescription label. Do not cut, crush, or chew this medication. Swallow the tablets whole. Take it with food. Do not take it more than directed. Take all of this medication unless your care team tells you to stop it early. Keep taking it even if you think you are better. Take products with antacids in them at a different time of day than this medication. Take this medication 4 hours before or 4 hours after antacids. Talk to your care team if you have questions. Talk to your care team about the use of this medication in children. While this medication may be prescribed for selected conditions, precautions do apply. Overdosage: If you think you have taken too much of this medicine contact a poison control center or emergency room at once. NOTE: This medicine is only for you. Do not share this medicine with others. What if I miss a dose? If you miss a dose, take it as soon as you can. If it is almost time for your next dose, take only that dose. Do not take double or extra doses. What may interact with this medication? Do not take this medication with any of the following: Cisapride Dronedarone Pimozide Thioridazine This medication may also interact with the following: Ampicillin Antacids Cimetidine Cyclosporine Digoxin Kaolin Medications for diabetes, such as insulin, glipizide, glyburide Medications for seizures, such as carbamazepine, phenobarbital, phenytoin Mefloquine Methotrexate Other medications that cause heart rhythm changes Praziquantel This list may not describe all possible interactions. Give your health care provider a list of all the medicines, herbs, non-prescription drugs, or dietary supplements you use. Also tell them if you smoke, drink alcohol, or  use illegal drugs. Some items may interact with your medicine. What should I watch for while using this medication? Visit your care team for regular checks on your progress. Tell your care team if your symptoms do not start to get better or if they get worse. You may need blood work done while you are taking this medication. If you take other medications that can affect heart rhythm, you may need more testing. Talk to your care team if you have questions. Your vision may be tested before and during use of this medication. Tell your care team right away if you have any change in your eyesight. This medication may cause serious skin reactions. They can happen weeks to months after starting the medication. Contact your care team right away if you notice fevers or flu-like symptoms with a rash. The rash may be red or purple and then turn into blisters or peeling of the skin. Or, you might notice a red rash with swelling of the face, lips or lymph nodes in your neck or under your arms. If you or your family notice any changes in your behavior, such as new or worsening depression, thoughts of harming yourself, anxiety,  or other unusual or disturbing thoughts, or memory loss, call your care team right away. What side effects may I notice from receiving this medication? Side effects that you should report to your care team as soon as possible: Allergic reactions--skin rash, itching, hives, swelling of the face, lips, tongue, or throat Aplastic anemia--unusual weakness or fatigue, dizziness, headache, trouble breathing, increased bleeding or bruising Change in vision Heart rhythm changes--fast or irregular heartbeat, dizziness, feeling faint or lightheaded, chest pain, trouble breathing Infection--fever, chills, cough, or sore throat Low blood sugar (hypoglycemia)--tremors or shaking, anxiety, sweating, cold or clammy skin, confusion, dizziness, rapid heartbeat Muscle injury--unusual weakness or fatigue,  muscle pain, dark yellow or brown urine, decrease in amount of urine Pain, tingling, or numbness in the hands or feet Rash, fever, and swollen lymph nodes Redness, blistering, peeling, or loosening of the skin, including inside the mouth Thoughts of suicide or self-harm, worsening mood, or feelings of depression Unusual bruising or bleeding Side effects that usually do not require medical attention (report to your care team if they continue or are bothersome): Diarrhea Headache Nausea Stomach pain Vomiting This list may not describe all possible side effects. Call your doctor for medical advice about side effects. You may report side effects to FDA at 1-800-FDA-1088. Where should I keep my medication? Keep out of the reach of children and pets. Store at room temperature up to 30 degrees C (86 degrees F). Protect from light. Get rid of any unused medication after the expiration date. To get rid of medications that are no longer needed or have expired: Take the medication to a medication take-back program. Check with your pharmacy or law enforcement to find a location. If you cannot return the medication, check the label or package insert to see if the medication should be thrown out in the garbage or flushed down the toilet. If you are not sure, ask your care team. If it is safe to put it in the trash, empty the medication out of the container. Mix the medication with cat litter, dirt, coffee grounds, or other unwanted substance. Seal the mixture in a bag or container. Put it in the trash. NOTE: This sheet is a summary. It may not cover all possible information. If you have questions about this medicine, talk to your doctor, pharmacist, or health care provider.  2024 Elsevier/Gold Standard (2022-06-20 00:00:00)

## 2024-03-20 NOTE — Progress Notes (Signed)
 CBC WNL

## 2024-03-21 ENCOUNTER — Telehealth: Payer: Self-pay

## 2024-03-21 ENCOUNTER — Other Ambulatory Visit: Payer: Self-pay | Admitting: *Deleted

## 2024-03-21 DIAGNOSIS — M0579 Rheumatoid arthritis with rheumatoid factor of multiple sites without organ or systems involvement: Secondary | ICD-10-CM

## 2024-03-21 DIAGNOSIS — Z79899 Other long term (current) drug therapy: Secondary | ICD-10-CM

## 2024-03-21 DIAGNOSIS — R768 Other specified abnormal immunological findings in serum: Secondary | ICD-10-CM

## 2024-03-21 LAB — COMPREHENSIVE METABOLIC PANEL WITH GFR
AG Ratio: 1.9 (calc) (ref 1.0–2.5)
ALT: 13 U/L (ref 6–29)
AST: 19 U/L (ref 10–35)
Albumin: 4.5 g/dL (ref 3.6–5.1)
Alkaline phosphatase (APISO): 86 U/L (ref 37–153)
BUN/Creatinine Ratio: 23 (calc) — ABNORMAL HIGH (ref 6–22)
BUN: 23 mg/dL (ref 7–25)
CO2: 29 mmol/L (ref 20–32)
Calcium: 9.9 mg/dL (ref 8.6–10.4)
Chloride: 103 mmol/L (ref 98–110)
Creat: 0.98 mg/dL — ABNORMAL HIGH (ref 0.60–0.95)
Globulin: 2.4 g/dL (ref 1.9–3.7)
Glucose, Bld: 87 mg/dL (ref 65–99)
Potassium: 4.7 mmol/L (ref 3.5–5.3)
Sodium: 139 mmol/L (ref 135–146)
Total Bilirubin: 0.7 mg/dL (ref 0.2–1.2)
Total Protein: 6.9 g/dL (ref 6.1–8.1)
eGFR: 58 mL/min/{1.73_m2} — ABNORMAL LOW (ref >=60)

## 2024-03-21 LAB — CBC WITH DIFFERENTIAL/PLATELET
Absolute Lymphocytes: 1928 {cells}/uL (ref 850–3900)
Absolute Monocytes: 586 {cells}/uL (ref 200–950)
Basophils Absolute: 61 {cells}/uL (ref 0–200)
Basophils Relative: 1 %
Eosinophils Absolute: 488 {cells}/uL (ref 15–500)
Eosinophils Relative: 8 %
HCT: 43.4 % (ref 35.0–45.0)
Hemoglobin: 14.8 g/dL (ref 11.7–15.5)
MCH: 30.6 pg (ref 27.0–33.0)
MCHC: 34.1 g/dL (ref 32.0–36.0)
MCV: 89.7 fL (ref 80.0–100.0)
MPV: 12 fL (ref 7.5–12.5)
Monocytes Relative: 9.6 %
Neutro Abs: 3038 {cells}/uL (ref 1500–7800)
Neutrophils Relative %: 49.8 %
Platelets: 263 10*3/uL (ref 140–400)
RBC: 4.84 10*6/uL (ref 3.80–5.10)
RDW: 12.7 % (ref 11.0–15.0)
Total Lymphocyte: 31.6 %
WBC: 6.1 10*3/uL (ref 3.8–10.8)

## 2024-03-21 NOTE — Progress Notes (Signed)
 Creatinine was borderline elevated-0.98 and GFR was low-58. Avoid the use of NSAIDs and increase water intake.   Plan to recheck CBC and CMP 1 month after starting humira.

## 2024-03-21 NOTE — Telephone Encounter (Addendum)
 Submitted a Prior Authorization request to [employer] for HUMIRA via CoverMyMeds. Will update once we receive a response.  Key: [id number]   Humira PAP application (patient and provider forms and income docs) scanned to [facility] for submission once PA approved  ----- Message from Murrell Redden sent at 03/20/2024 10:48 AM EDT ----- Please start Humira B[facility] [employer] application provided to pt today

## 2024-03-25 ENCOUNTER — Other Ambulatory Visit (HOSPITAL_COMMUNITY): Payer: Self-pay

## 2024-03-25 NOTE — Telephone Encounter (Signed)
 Received notification from [employer] regarding a prior authorization for HUMIRA. Authorization has been APPROVED from 02/20/2024 to 03/19/2024.   Per test claim, copay for 28 days supply is $1923.15  Authorization # [id number]  Submitted Patient Assistance Application to [employer] for HUMIRA along with provider portion, patient portion, test claim, medication list, and income documents. Will update patient when we receive a response.  Phone: [phone] Fax: [phone]

## 2024-04-11 ENCOUNTER — Telehealth: Payer: Self-pay | Admitting: Rheumatology

## 2024-04-11 NOTE — Telephone Encounter (Signed)
 Patient called checking the status of her PA for Humira medication.

## 2024-04-22 NOTE — Progress Notes (Deleted)
 Office Visit Note  Patient: Victoria Sharp             Date of Birth: 1941-10-16           MRN: [id number]             PCP: Lieutenant Reese., MD Referring: Lieutenant Reese.,* Visit Date: 05/06/2024 Occupation: @GUAROCC @  Subjective:  Medication monitoring  History of Present Illness: Victoria Sharp is a 82 y.o. female with history of seropositive rheumatoid arthritis and osteoarthritis.  Patient is currently taking Humira  40 mg sq injections every 21 days   CBC and CMP updated on 03/20/24.  CMP updated on 04/15/24. TB gold negative on 07/31/23. Discussed the importance of holding Humira  if she develops signs or symptoms of an infection and to resume once the infection has completely cleared.  Activities of Daily Living:  Patient reports morning stiffness for *** {minute/hour:19697}.   Patient {ACTIONS;DENIES/REPORTS:21021675::Denies} nocturnal pain.  Difficulty dressing/grooming: {ACTIONS;DENIES/REPORTS:21021675::Denies} Difficulty climbing stairs: {ACTIONS;DENIES/REPORTS:21021675::Denies} Difficulty getting out of chair: {ACTIONS;DENIES/REPORTS:21021675::Denies} Difficulty using hands for taps, buttons, cutlery, and/or writing: {ACTIONS;DENIES/REPORTS:21021675::Denies}  No Rheumatology ROS completed.   PMFS History:  Patient Active Problem List   Diagnosis Date Noted   Spondylolisthesis of lumbar region 03/07/2022   TIA (transient ischemic attack) 01/27/2021   Rheumatoid arthritis with rheumatoid factor of multiple sites without organ or systems involvement (HCC) 06/26/2020   Essential hypertension 06/26/2020   MVP (mitral valve prolapse) 06/26/2020   History of Barrett's esophagus 06/26/2020   History of gastroesophageal reflux (GERD) 06/26/2020   History of breast cancer 06/26/2020   High risk medication use 06/26/2020    Past Medical History:  Diagnosis Date   Barrett's esophagus    Cancer (HCC)    Dermatitis    History of breast cancer     History of diverticulitis    Hypertension    Migraines    PONV (postoperative nausea and vomiting)    Rheumatoid arthritis (HCC)    Sacroiliitis (HCC)    right side   Tachycardia    TIA (transient ischemic attack) 01/2020   questionable TIA vs migraine per neurologist    Family History  Problem Relation Age of Onset   Heart attack Father    Kidney disease Brother    Cancer Brother    Past Surgical History:  Procedure Laterality Date   ABDOMINAL HYSTERECTOMY     ABLATION  08/2021   Dr. Daisey Dryer   BREAST LUMPECTOMY Left 1998   NEUROMA SURGERY Right 03/2020   TRANSFORAMINAL LUMBAR INTERBODY FUSION W/ MIS 1 LEVEL N/A 03/07/2022   Procedure: LUMBAR FOUR- FIVE,  MINIMALLY  INVASIVE TRANSFORAMINAL LUMBAR  INTERBODY FUSION  w/ METRIX;  Surgeon: Cannon Champion, MD;  Location: [facility] OR;  Service: Neurosurgery;  Laterality: N/A;  3C/RM 21   WEDGE RESECTION     Social History   Social History Narrative   Lives with husband in home   Right Handed   Drinks coffee for caffeine   Immunization History  Administered Date(s) Administered   Moderna Covid-19 Fall Seasonal Vaccine 46yrs & older 10/07/2022   PFIZER(Purple Top)SARS-COV-2 Vaccination 01/09/2020, 01/30/2020, 09/11/2020, 10/18/2021     Objective: Vital Signs: There were no vitals taken for this visit.   Physical Exam Vitals and nursing note reviewed.  Constitutional:      Appearance: She is well-developed.  HENT:     Head: Normocephalic and atraumatic.  Eyes:     Conjunctiva/sclera: Conjunctivae normal.  Cardiovascular:     Rate and  Rhythm: Normal rate and regular rhythm.     Heart sounds: Normal heart sounds.  Pulmonary:     Effort: Pulmonary effort is normal.     Breath sounds: Normal breath sounds.  Abdominal:     General: Bowel sounds are normal.     Palpations: Abdomen is soft.  Musculoskeletal:     Cervical back: Normal range of motion.  Lymphadenopathy:     Cervical: No cervical adenopathy.  Skin:     General: Skin is warm and dry.     Capillary Refill: Capillary refill takes less than 2 seconds.  Neurological:     Mental Status: She is alert and oriented to person, place, and time.  Psychiatric:        Behavior: Behavior normal.      Musculoskeletal Exam: ***  CDAI Exam: CDAI Score: -- Patient Global: --; Provider Global: -- Swollen: --; Tender: -- Joint Exam 05/06/2024   No joint exam has been documented for this visit   There is currently no information documented on the homunculus. Go to the Rheumatology activity and complete the homunculus joint exam.  Investigation: No additional findings.  Imaging: No results found.  Recent Labs: Lab Results  Component Value Date   WBC 6.1 03/20/2024   HGB 14.8 03/20/2024   PLT 263 03/20/2024   NA 139 03/20/2024   K 4.7 03/20/2024   CL 103 03/20/2024   CO2 29 03/20/2024   GLUCOSE 87 03/20/2024   BUN 23 03/20/2024   CREATININE 0.98 (H) 03/20/2024   BILITOT 0.7 03/20/2024   ALKPHOS 81 12/02/2023   AST 19 03/20/2024   ALT 13 03/20/2024   PROT 6.9 03/20/2024   ALBUMIN 3.9 12/02/2023   CALCIUM 9.9 03/20/2024   GFRAA 62 06/09/2021   QFTBGOLDPLUS NEGATIVE 07/31/2023    Speciality Comments: Methotrexate-diarrhea, Arava -dermatitis Enbrel  08/2020  Procedures:  No procedures performed Allergies: Contrast media [iodinated contrast media], Latex, Leflunomide , Methotrexate derivatives, Metronidazole, and Sulfamethoxazole-trimethoprim   Assessment / Plan:     Visit Diagnoses: Rheumatoid arthritis with rheumatoid factor of multiple sites without organ or systems involvement (HCC)  High risk medication use  Positive ANA (antinuclear antibody)  Primary osteoarthritis of both hands  Primary osteoarthritis of both feet  Other secondary scoliosis, lumbar region  Chronic low back pain, unspecified back pain laterality, unspecified whether sciatica present  Other eczema  Osteopenia of multiple sites  Vitamin D  deficiency  Essential hypertension  MVP (mitral valve prolapse)  History of Barrett's esophagus  History of gastroesophageal reflux (GERD)  History of breast cancer  Orders: No orders of the defined types were placed in this encounter.  No orders of the defined types were placed in this encounter.   Face-to-face time spent with patient was *** minutes. Greater than 50% of time was spent in counseling and coordination of care.  Follow-Up Instructions: No follow-ups on file.   Romayne Clubs, PA-C  Note - This record has been created using Dragon software.  Chart creation errors have been sought, but may not always  have been located. Such creation errors do not reflect on  the standard of medical care.

## 2024-04-25 NOTE — Telephone Encounter (Signed)
 Called [employer] for update on Humira application. Per rep, they need patient to call and verify household size (they left her a VM on 03/27/24). Left VM for patient advising her of [employer]'s phone number to call and provide necessary information  Phone: [phone]   Geraldene Kleine, PharmD, MPH, BCPS, CPP Clinical Pharmacist (Rheumatology and Pulmonology)

## 2024-04-25 NOTE — Telephone Encounter (Signed)
 Addressed in previous BIV encounter

## 2024-04-29 ENCOUNTER — Telehealth: Payer: Self-pay | Admitting: Rheumatology

## 2024-04-29 NOTE — Telephone Encounter (Signed)
 Patient called stating she called [employer] and gave them the information that was missing.  Patient was told the office should get a response very soon.

## 2024-04-30 NOTE — Telephone Encounter (Signed)
 Noted - will await response/determination from Christianna Cowman, PharmD, MPH, BCPS, CPP Clinical Pharmacist (Rheumatology and Pulmonology)

## 2024-05-02 NOTE — Telephone Encounter (Signed)
 Spoke with patient to discuss if she had reached out to [employer] with the Humira program application information. Patient stated she did contact [employer] and provided the appropriate information.   Called [employer] to confirm this and check status of application. Per the first representative, the patient did call last week to verbally provide the number members in her household and household income. However, they were still awaiting proof of income documentation.   Spoke with patient to ask if she was made aware that she also needed to submit this documentation. She stated that she had previously submitted proof of income in her initial application. After review of the documents that were submitted to [employer] this was found to be correct.   Called [employer] once again to discuss this. A different representative confirmed that they had actually received appropriate documentation with the initial application but this information had not been marked as received on their end. She fixed this over the phone and stated that it should take 7-10 business days for a decision to be reached for her application.   Phone #:  [phone]   Tolu Shekinah Pitones, PharmD [facility] PGY-1

## 2024-05-03 MED ORDER — HUMIRA (2 PEN) 40 MG/0.4ML ~~LOC~~ AJKT
40.0000 mg | AUTO-INJECTOR | SUBCUTANEOUS | 0 refills | Status: DC
  Filled 2024-05-06 (×2): qty 2, 28d supply, fill #0

## 2024-05-03 NOTE — Telephone Encounter (Signed)
 Enrolled patient into RA grant through [employer]: ID: [id number] Eligibility start date: 02/02/2024 Group ID: [id number] Eligibility end date: 05/01/2025 RxBin ID: [id number] Assistance amount: $2500 PCN: [id number]  Spoke with patient. She was advised by Laban Pia this morning to collet extensive proof of income and out of pocket costs. She is grateful that she will no longer have to do that.  She is on antibiotics for bronchitis and will finish course next Friday  Scheduled for Humira new start on 05/15/2024. Rx sent to [facility] to be couriered to clinic before appt.  Geraldene Kleine, PharmD, MPH, BCPS, CPP Clinical Pharmacist (Rheumatology and Pulmonology)

## 2024-05-03 NOTE — Addendum Note (Signed)
 Addended by: Thais Fill on: 05/03/2024 03:16 PM   Modules accepted: Orders

## 2024-05-06 ENCOUNTER — Other Ambulatory Visit (HOSPITAL_COMMUNITY): Payer: Self-pay

## 2024-05-06 ENCOUNTER — Other Ambulatory Visit: Payer: Self-pay

## 2024-05-06 ENCOUNTER — Ambulatory Visit: Payer: Medicare Other | Admitting: Physician Assistant

## 2024-05-06 DIAGNOSIS — Z853 Personal history of malignant neoplasm of breast: Secondary | ICD-10-CM

## 2024-05-06 DIAGNOSIS — Z8719 Personal history of other diseases of the digestive system: Secondary | ICD-10-CM

## 2024-05-06 DIAGNOSIS — M4156 Other secondary scoliosis, lumbar region: Secondary | ICD-10-CM

## 2024-05-06 DIAGNOSIS — M545 Low back pain, unspecified: Secondary | ICD-10-CM

## 2024-05-06 DIAGNOSIS — M8589 Other specified disorders of bone density and structure, multiple sites: Secondary | ICD-10-CM

## 2024-05-06 DIAGNOSIS — L308 Other specified dermatitis: Secondary | ICD-10-CM

## 2024-05-06 DIAGNOSIS — R768 Other specified abnormal immunological findings in serum: Secondary | ICD-10-CM

## 2024-05-06 DIAGNOSIS — I341 Nonrheumatic mitral (valve) prolapse: Secondary | ICD-10-CM

## 2024-05-06 DIAGNOSIS — E559 Vitamin D deficiency, unspecified: Secondary | ICD-10-CM

## 2024-05-06 DIAGNOSIS — Z79899 Other long term (current) drug therapy: Secondary | ICD-10-CM

## 2024-05-06 DIAGNOSIS — M0579 Rheumatoid arthritis with rheumatoid factor of multiple sites without organ or systems involvement: Secondary | ICD-10-CM

## 2024-05-06 DIAGNOSIS — M19041 Primary osteoarthritis, right hand: Secondary | ICD-10-CM

## 2024-05-06 DIAGNOSIS — I10 Essential (primary) hypertension: Secondary | ICD-10-CM

## 2024-05-06 DIAGNOSIS — M19071 Primary osteoarthritis, right ankle and foot: Secondary | ICD-10-CM

## 2024-05-06 NOTE — Progress Notes (Signed)
 Specialty Pharmacy Initial Fill Coordination Note  Victoria Sharp is a 83 y.o. female contacted today regarding initial fill of specialty medication(s) Adalimumab (Humira (2 Pen))   Patient requested Courier to Provider Office   Delivery date: 05/08/24   Verified address: [address] [place], [place] [address]   Medication will be filled on 05/07/24.   Patient has grant on file and is aware of $0 copayment.

## 2024-05-08 NOTE — Progress Notes (Signed)
 Pharmacy Note  Subjective:   Patient presents to clinic today to receive first dose of Humira  for rheumatoid arthritis. She was previously on Enbrel  up until 3 months ago but was unable to continue due to insurance no longer covering medication. Patient was denied [employer] PAP and now finally enrolled into grant to help with cost. She reports no significant RA flares but has noticed some inflammation and pain in hands  Patient running a fever or have signs/symptoms of infection? No  Patient currently on antibiotics for the treatment of infection? No  Patient have any upcoming invasive procedures/surgeries? No  Objective: CMP     Component Value Date/Time   NA 139 03/20/2024 0951   K 4.7 03/20/2024 0951   CL 103 03/20/2024 0951   CO2 29 03/20/2024 0951   GLUCOSE 87 03/20/2024 0951   BUN 23 03/20/2024 0951   CREATININE 0.98 (H) 03/20/2024 0951   CALCIUM 9.9 03/20/2024 0951   PROT 6.9 03/20/2024 0951   ALBUMIN 3.9 12/02/2023 0949   AST 19 03/20/2024 0951   ALT 13 03/20/2024 0951   ALKPHOS 81 12/02/2023 0949   BILITOT 0.7 03/20/2024 0951   GFRNONAA >60 12/02/2023 0949   GFRNONAA 53 (L) 06/09/2021 0856   GFRAA 62 06/09/2021 0856    CBC    Component Value Date/Time   WBC 6.1 03/20/2024 0951   RBC 4.84 03/20/2024 0951   HGB 14.8 03/20/2024 0951   HCT 43.4 03/20/2024 0951   PLT 263 03/20/2024 0951   MCV 89.7 03/20/2024 0951   MCH 30.6 03/20/2024 0951   MCHC 34.1 03/20/2024 0951   RDW 12.7 03/20/2024 0951   LYMPHSABS 1.8 12/02/2023 0949   MONOABS 0.5 12/02/2023 0949   EOSABS 488 03/20/2024 0951   BASOSABS 61 03/20/2024 0951    Baseline Immunosuppressant Therapy Labs TB GOLD    Latest Ref Rng & Units 07/31/2023   10:49 AM  Quantiferon TB Gold  Quantiferon TB Gold Plus NEGATIVE NEGATIVE    Hepatitis Panel    Latest Ref Rng & Units 06/26/2020    9:05 AM  Hepatitis  Hep B Surface Ag NON-REACTI NON-REACTIVE   Hep B IgM NON-REACTI NON-REACTIVE    HIV Lab Results   Component Value Date   HIV NON-REACTIVE 06/26/2020   Immunoglobulins    Latest Ref Rng & Units 06/26/2020    9:05 AM  Immunoglobulin Electrophoresis  IgA  70 - 320 mg/dL 409   IgG 811 - 9,147 mg/dL 829   IgM 50 - 562 mg/dL 40    SPEP    Latest Ref Rng & Units 03/20/2024    9:51 AM  Serum Protein Electrophoresis  Total Protein 6.1 - 8.1 g/dL 6.9    Z3YQ No results found for: "G6PDH" TPMT No results found for: "TPMT"   Chest x-ray: 12/02/2023 - No acute cardiopulmonary abnormality.  Assessment/Plan:  Counseled patient that Humira  is a TNF blocking agent.  Counseled patient on purpose, proper use, and adverse effects of Humira .  Reviewed the most common adverse effects including infections, headache, and injection site reactions. Discussed that there is the possibility of an increased risk of malignancy including non-melanoma skin cancer but it is not well understood if this increased risk is due to the medication or the disease state.  Advised patient to get yearly dermatology exams due to risk of skin cancer. Counseled patient that Humira  should be held prior to scheduled surgery.  Counseled patient to avoid live vaccines while on Humira .  Recommend annual influenza, PCV  15 or PCV20 or Pneumovax 23, and Shingrix as indicated.  Provided patient with medication education material and answered all questions.  Patient consented to Humira .  Will upload consent into the media tab.  Reviewed storage instructions of Humira .  Patient verbalized understanding.   Reviewed importance of holding Humira  with signs/symptoms of an infections, if antibiotics are prescribed to treat an active infection, and with invasive procedures  Demonstrated proper injection technique with Humira  demo device  Patient able to demonstrate proper injection technique using the teach back method.  Patient self injected in the right lower abdomen with:  Pharmacy-supplied Medication: Humira  40mg /0.30mL pen injector NDC:  [address]-02 Lot: [id number] Expiration: 06/2025  Patient tolerated well.  Observed for 30 mins in office for adverse reaction. Patient denies itchiness and irritation at injection., No swelling or redness noted., and Reviewed injection site reaction management with patient verbally and printed information for review in AVS  Patient is to return in 1 month for labs and 6-8 weeks for follow-up appointment.  Standing orders for CBC/CMP placed.  TB gold will be monitored yearly.  Humira  approved through insurance and grant .   Rx sent to: [facility]: [phone] .  Patient provided with pharmacy phone number and advised that they will call later this week to schedule shipment to home.  Patient will continue Humira  40mg  subcut every 14 days as monotherapy.  All questions encouraged and answered.  Instructed patient to call with any further questions or concerns.  Geraldene Kleine, PharmD, MPH, BCPS, CPP Clinical Pharmacist (Rheumatology and Pulmonology)  05/08/2024 11:32 AM

## 2024-05-09 NOTE — Patient Instructions (Signed)
 Your next HUMIRA dose is due on 05/29/24, 06/12/24, and every 14 days thereafter  HOLD HUMIRA if you have signs or symptoms of an infection. You can resume once you feel better or back to your baseline. HOLD HUMIRA if you start antibiotics to treat an infection. HOLD HUMIRA around the time of surgery/procedures. Your surgeon will be able to provide recommendations on when to hold BEFORE and when you are cleared to RESUME.  Pharmacy information: Your prescription will be shipped from [facility]. Their phone number is [phone] They will call to schedule shipment and confirm address. They will mail your medication to your home.  Labs are due in 1 month then every 3 months. Lab hours are from Monday to Thursday 8am-12:30pm and 1pm-4pm and Friday 8am-12pm. You do not need an appointment if you come for labs during these times. If you'd like to go to a [facility] or [facility] closer to home, please call our clinic 48 hours prior to lab date so we can release orders in a timely manner.  Stay up to date on all routine vaccines: influenza, pneumonia, COVID19, Shingles  How to manage an injection site reaction: Remember the 5 C's: COUNTER - leave on the counter at least 30 minutes but up to overnight to bring medication to room temperature. This may help prevent stinging COLD - place something cold (like an ice gel pack or cold water bottle) on the injection site just before cleansing with alcohol . This may help reduce pain CLARITIN - use Claritin (generic name is loratadine) for the first two weeks of treatment or the day of, the day before, and the day after injecting. This will help to minimize injection site reactions CORTISONE CREAM - apply if injection site is irritated and itching CALL ME - if injection site reaction is bigger than the size of your fist, looks infected, blisters, or if you develop hives  Adalimumab Injection What is this medication? ADALIMUMAB (ay da LIM yoo mab)  treats autoimmune conditions, such as psoriasis, arthritis, Crohn disease, and ulcerative colitis. It works by slowing down an overactive immune system.  It may also be used to treat hidradenitis suppurativa (HS). HS is a condition that causes painful lumps under the skin in areas such as the armpits and groin. It belongs to a group of medications called TNF inhibitors. It is a monoclonal antibody. This medicine may be used for other purposes; ask your health care provider or pharmacist if you have questions. COMMON BRAND NAME(S): ABRILADA, AMJEVITA, CYLTEZO, HADLIMA, Hulio, Hulio PEN, Humira, HUMIRA PEN, Hyrimoz, Idacio, Simlandi, Yuflyma, YUSIMRY What should I tell my care team before I take this medication? They need to know if you have any of these conditions: Cancer Diabetes (high blood sugar) Having surgery Heart disease Hepatitis B Immune system problems Infections, such as tuberculosis (TB) or other bacterial, fungal, or viral infections Multiple sclerosis Recent or upcoming vaccine An unusual or allergic reaction to adalimumab, mannitol, latex, rubber, other medications, foods, dyes, or preservatives Pregnant or trying to get pregnant Breast-feeding How should I use this medication? This medication is injected under the skin. It may be given by your care team in a hospital or clinic setting. It may also be given at home. If you get this medication at home, you will be taught how to prepare and give it. Use exactly as directed. Take it as directed on the prescription label. Keep taking it unless your care team tells you to stop. This medication comes with  INSTRUCTIONS FOR USE. Ask your pharmacist for directions on how to use this medication. Read the information carefully. Talk to your pharmacist or care team if you have questions. It is important that you put your used needles and syringes in a special sharps container. Do not put them in a trash can. If you do not have a sharps  container, call your pharmacist or care team to get one. A special MedGuide will be given to you by the pharmacist with each prescription and refill. If you are getting this medication in a hospital or clinic, a special MedGuide will be given to you before each treatment. Be sure to read this information carefully each time. Talk to your care team about the use of this medication in children. While it be prescribed for children as young as 2 years for selected conditions, precautions do apply. Overdosage: If you think you have taken too much of this medicine contact a poison control center or emergency room at once. NOTE: This medicine is only for you. Do not share this medicine with others. What if I miss a dose? If you get this medication at the hospital or clinic: it is important not to miss your dose. Call your care team if you are unable to keep an appointment. If you give yourself this medication at home: If you miss a dose, take it as soon as you can. If it is almost time for your next dose, take only that dose. Do not take double or extra doses. Call your care team with questions. What may interact with this medication? Do not take this medication with any of the following: Abatacept Anakinra Biologic medications, such as certolizumab, etanercept , golimumab, infliximab Live virus vaccines This medication may also interact with the following: Cyclosporine Theophylline Vaccines Warfarin This list may not describe all possible interactions. Give your health care provider a list of all the medicines, herbs, non-prescription drugs, or dietary supplements you use. Also tell them if you smoke, drink alcohol , or use illegal drugs. Some items may interact with your medicine. What should I watch for while using this medication? Visit your care team for regular checks on your progress. Tell your care team if your symptoms do not start to get better or if they get worse. You will be tested for  tuberculosis (TB) before you start this medication. If your care team prescribes any medication for TB, you should start taking the TB medication before starting this medication. Make sure to finish the full course of TB medication. This medication may increase your risk of getting an infection. Call your care team for advice if you get a fever, chills, sore throat, or other symptoms of a cold or flu. Do not treat yourself. Try to avoid being around people who are sick. Talk to your care team about your risk of cancer. You may be more at risk for certain types of cancer if you take this medication. What side effects may I notice from receiving this medication? Side effects that you should report to your care team as soon as possible: Allergic reactions--skin rash, itching, hives, swelling of the face, lips, tongue, or throat Aplastic anemia--unusual weakness or fatigue, dizziness, headache, trouble breathing, increased bleeding or bruising Body pain, tingling, or numbness Heart failure--shortness of breath, swelling of the ankles, feet, or hands, sudden weight gain, unusual weakness or fatigue Infection--fever, chills, cough, sore throat, wounds that don't heal, pain or trouble when passing urine, general feeling of discomfort or being unwell Lupus-like  syndrome--joint pain, swelling, or stiffness, butterfly-shaped rash on the face, rashes that get worse in the sun, fever, unusual weakness or fatigue Unusual bruising or bleeding Side effects that usually do not require medical attention (report to your care team if they continue or are bothersome): Headache Nausea Pain, redness, or irritation at injection site Runny or stuffy nose Sore throat Stomach pain This list may not describe all possible side effects. Call your doctor for medical advice about side effects. You may report side effects to FDA at 1-800-FDA-1088. Where should I keep my medication? Keep out of the reach of children and  pets. Store in the refrigerator. Do not freeze. Keep this medication in the original packaging until you are ready to take it. Protect from light. Get rid of any unused medication after the expiration date. This medication may be stored at room temperature for up to 14 days. Keep this medication in the original packaging. Protect from light. If it is stored at room temperature, get rid of any unused medication after 14 days or after it expires, whichever is first. To get rid of medications that are no longer needed or have expired: Take the medication to a medication take-back program. Check with your pharmacy or law enforcement to find a location. If you cannot return the medication, ask your pharmacist or care team how to get rid of this medication safely. NOTE: This sheet is a summary. It may not cover all possible information. If you have questions about this medicine, talk to your doctor, pharmacist, or health care provider.  2024 Elsevier/Gold Standard (2023-11-24 00:00:00)

## 2024-05-15 ENCOUNTER — Ambulatory Visit: Attending: Rheumatology | Admitting: Pharmacist

## 2024-05-15 ENCOUNTER — Other Ambulatory Visit (HOSPITAL_COMMUNITY): Payer: Self-pay

## 2024-05-15 ENCOUNTER — Other Ambulatory Visit: Payer: Self-pay

## 2024-05-15 DIAGNOSIS — Z7189 Other specified counseling: Secondary | ICD-10-CM | POA: Diagnosis present

## 2024-05-15 DIAGNOSIS — Z79899 Other long term (current) drug therapy: Secondary | ICD-10-CM

## 2024-05-15 DIAGNOSIS — M0579 Rheumatoid arthritis with rheumatoid factor of multiple sites without organ or systems involvement: Secondary | ICD-10-CM | POA: Diagnosis not present

## 2024-05-15 MED ORDER — HUMIRA (2 PEN) 40 MG/0.4ML ~~LOC~~ AJKT
40.0000 mg | AUTO-INJECTOR | SUBCUTANEOUS | 1 refills | Status: DC
  Filled 2024-05-15 – 2024-05-23 (×2): qty 2, 28d supply, fill #0
  Filled 2024-07-05: qty 2, 28d supply, fill #1

## 2024-05-15 NOTE — Progress Notes (Signed)
 Patient started Humira  in office today. Counseled about Humira  today in office. Consent obtained  Patient will continue Humira  40mg  subcut every 14 days as monotherapy.  Geraldene Kleine, PharmD, MPH, BCPS, CPP Clinical Pharmacist (Rheumatology and Pulmonology)

## 2024-05-22 ENCOUNTER — Other Ambulatory Visit: Payer: Self-pay

## 2024-05-23 ENCOUNTER — Other Ambulatory Visit: Payer: Self-pay

## 2024-05-23 ENCOUNTER — Other Ambulatory Visit (HOSPITAL_COMMUNITY): Payer: Self-pay

## 2024-05-23 NOTE — Progress Notes (Signed)
 Specialty Pharmacy Refill Coordination Note  MyChart Questionnaire Submission  Victoria Sharp is a 83 y.o. female contacted today regarding refills of specialty medication(s) Humira .  Patient requested (Patient-Rptd) Delivery   Delivery date: (Patient-Rptd) 06/07/24   Verified address: (Patient-Rptd) [address] [place]. [address]   Medication will be filled on 06/06/24.

## 2024-06-06 ENCOUNTER — Other Ambulatory Visit: Payer: Self-pay

## 2024-06-10 NOTE — Progress Notes (Deleted)
 Office Visit Note  Patient: Victoria Sharp             Date of Birth: [birth year]           MRN: [id number]             PCP: Lieutenant Reese., MD Referring: Lieutenant Reese.,* Visit Date: 06/24/2024 Occupation: @GUAROCC @  Subjective:  No chief complaint on file.   History of Present Illness: Victoria Sharp is a 83 y.o. female ***   Humira    Last dose of Enbrel  administered on 03/13/2024-- Enbrel  50 mg sq every 21 days (started September 24, 2020)--no longer qualifies for patient assistance. Plan to apply for Humira  40 mg sq injections every 14 days--patient plans on spacing Humira  to every 21 days if approved.   If she does not qualify for patient assistance discussed the option of initiating hydroxychloroquine-consent obtained today.   Activities of Daily Living:  Patient reports morning stiffness for *** {minute/hour:19697}.   Patient {ACTIONS;DENIES/REPORTS:21021675::Denies} nocturnal pain.  Difficulty dressing/grooming: {ACTIONS;DENIES/REPORTS:21021675::Denies} Difficulty climbing stairs: {ACTIONS;DENIES/REPORTS:21021675::Denies} Difficulty getting out of chair: {ACTIONS;DENIES/REPORTS:21021675::Denies} Difficulty using hands for taps, buttons, cutlery, and/or writing: {ACTIONS;DENIES/REPORTS:21021675::Denies}  No Rheumatology ROS completed.   PMFS History:  Patient Active Problem List   Diagnosis Date Noted   Spondylolisthesis of lumbar region 03/07/2022   TIA (transient ischemic attack) 01/27/2021   Rheumatoid arthritis with rheumatoid factor of multiple sites without organ or systems involvement (HCC) 06/26/2020   Essential hypertension 06/26/2020   MVP (mitral valve prolapse) 06/26/2020   History of Barrett's esophagus 06/26/2020   History of gastroesophageal reflux (GERD) 06/26/2020   History of breast cancer 06/26/2020   High risk medication use 06/26/2020    Past Medical History:  Diagnosis Date   Barrett's esophagus    Cancer  (HCC)    Dermatitis    History of breast cancer    History of diverticulitis    Hypertension    Migraines    PONV (postoperative nausea and vomiting)    Rheumatoid arthritis (HCC)    Sacroiliitis (HCC)    right side   Tachycardia    TIA (transient ischemic attack) 01/2020   questionable TIA vs migraine per neurologist    Family History  Problem Relation Age of Onset   Heart attack Father    Kidney disease Brother    Cancer Brother    Past Surgical History:  Procedure Laterality Date   ABDOMINAL HYSTERECTOMY     ABLATION  08/2021   Dr. Daisey Dryer   BREAST LUMPECTOMY Left 1998   NEUROMA SURGERY Right 03/2020   TRANSFORAMINAL LUMBAR INTERBODY FUSION W/ MIS 1 LEVEL N/A 03/07/2022   Procedure: LUMBAR FOUR- FIVE,  MINIMALLY  INVASIVE TRANSFORAMINAL LUMBAR  INTERBODY FUSION  w/ METRIX;  Surgeon: Cannon Champion, MD;  Location: [facility] OR;  Service: Neurosurgery;  Laterality: N/A;  3C/RM 21   WEDGE RESECTION     Social History   Social History Narrative   Lives with husband in home   Right Handed   Drinks coffee for caffeine   Immunization History  Administered Date(s) Administered   Moderna Covid-19 Fall Seasonal Vaccine 48yrs & older 10/07/2022   PFIZER(Purple Top)SARS-COV-2 Vaccination 01/09/2020, 01/30/2020, 09/11/2020, 10/18/2021     Objective: Vital Signs: There were no vitals taken for this visit.   Physical Exam   Musculoskeletal Exam: ***  CDAI Exam: CDAI Score: -- Patient Global: --; Provider Global: -- Swollen: --; Tender: -- Joint Exam 06/24/2024   No joint exam has been documented  for this visit   There is currently no information documented on the homunculus. Go to the Rheumatology activity and complete the homunculus joint exam.  Investigation: No additional findings.  Imaging: No results found.  Recent Labs: Lab Results  Component Value Date   WBC 6.1 03/20/2024   HGB 14.8 03/20/2024   PLT 263 03/20/2024   NA 139 03/20/2024   K 4.7  03/20/2024   CL 103 03/20/2024   CO2 29 03/20/2024   GLUCOSE 87 03/20/2024   BUN 23 03/20/2024   CREATININE 0.98 (H) 03/20/2024   BILITOT 0.7 03/20/2024   ALKPHOS 81 12/02/2023   AST 19 03/20/2024   ALT 13 03/20/2024   PROT 6.9 03/20/2024   ALBUMIN 3.9 12/02/2023   CALCIUM 9.9 03/20/2024   GFRAA 62 06/09/2021   QFTBGOLDPLUS NEGATIVE 07/31/2023    Speciality Comments: Methotrexate-diarrhea, Arava -dermatitis Enbrel  08/2020-11/2023 (stopped due to insurance coverage) Humira  started 05/15/24  Procedures:  No procedures performed Allergies: Contrast media [iodinated contrast media], Latex, Leflunomide , Methotrexate derivatives, Metronidazole, and Sulfamethoxazole-trimethoprim   Assessment / Plan:     Visit Diagnoses: Rheumatoid arthritis with rheumatoid factor of multiple sites without organ or systems involvement (HCC)  High risk medication use  Positive ANA (antinuclear antibody)  Primary osteoarthritis of both hands  Primary osteoarthritis of both feet  Other secondary scoliosis, lumbar region  Chronic low back pain, unspecified back pain laterality, unspecified whether sciatica present  Other eczema  Osteopenia of multiple sites  Vitamin D deficiency  Essential hypertension  MVP (mitral valve prolapse)  History of Barrett's esophagus  History of gastroesophageal reflux (GERD)  History of breast cancer  Orders: No orders of the defined types were placed in this encounter.  No orders of the defined types were placed in this encounter.   Face-to-face time spent with patient was *** minutes. Greater than 50% of time was spent in counseling and coordination of care.  Follow-Up Instructions: No follow-ups on file.   Romayne Clubs, PA-C  Note - This record has been created using Dragon software.  Chart creation errors have been sought, but may not always  have been located. Such creation errors do not reflect on  the standard of medical care.

## 2024-06-19 ENCOUNTER — Emergency Department (HOSPITAL_BASED_OUTPATIENT_CLINIC_OR_DEPARTMENT_OTHER)

## 2024-06-19 ENCOUNTER — Encounter (HOSPITAL_BASED_OUTPATIENT_CLINIC_OR_DEPARTMENT_OTHER): Payer: Self-pay | Admitting: Emergency Medicine

## 2024-06-19 ENCOUNTER — Other Ambulatory Visit: Payer: Self-pay

## 2024-06-19 ENCOUNTER — Emergency Department (HOSPITAL_BASED_OUTPATIENT_CLINIC_OR_DEPARTMENT_OTHER)
Admission: EM | Admit: 2024-06-19 | Discharge: 2024-06-19 | Disposition: A | Discharge status: Home / Self Care | Attending: Emergency Medicine | Admitting: Emergency Medicine

## 2024-06-19 DIAGNOSIS — R1032 Left lower quadrant pain: Secondary | ICD-10-CM | POA: Diagnosis present

## 2024-06-19 DIAGNOSIS — K5792 Diverticulitis of intestine, part unspecified, without perforation or abscess without bleeding: Secondary | ICD-10-CM | POA: Insufficient documentation

## 2024-06-19 DIAGNOSIS — Z91040 Latex allergy status: Secondary | ICD-10-CM | POA: Insufficient documentation

## 2024-06-19 DIAGNOSIS — Z79899 Other long term (current) drug therapy: Secondary | ICD-10-CM | POA: Diagnosis not present

## 2024-06-19 LAB — URINALYSIS, ROUTINE W REFLEX MICROSCOPIC
Bilirubin Urine: NEGATIVE
Glucose, UA: NEGATIVE mg/dL
Hgb urine dipstick: NEGATIVE
Ketones, ur: NEGATIVE mg/dL
Leukocytes,Ua: NEGATIVE
Nitrite: NEGATIVE
Protein, ur: NEGATIVE mg/dL
Specific Gravity, Urine: 1.02 (ref 1.005–1.030)
pH: 5.5 (ref 5.0–8.0)

## 2024-06-19 LAB — BASIC METABOLIC PANEL WITH GFR
Anion gap: 16 — ABNORMAL HIGH (ref 5–15)
BUN: 23 mg/dL (ref 8–23)
CO2: 21 mmol/L — ABNORMAL LOW (ref 22–32)
Calcium: 9.5 mg/dL (ref 8.9–10.3)
Chloride: 101 mmol/L (ref 98–111)
Creatinine, Ser: 1.06 mg/dL — ABNORMAL HIGH (ref 0.44–1.00)
GFR, Estimated: 53 mL/min — ABNORMAL LOW (ref >60)
Glucose, Bld: 132 mg/dL — ABNORMAL HIGH (ref 70–99)
Potassium: 3.4 mmol/L — ABNORMAL LOW (ref 3.5–5.1)
Sodium: 138 mmol/L (ref 135–145)

## 2024-06-19 LAB — CBC WITH DIFFERENTIAL/PLATELET
Abs Immature Granulocytes: 0.03 10*3/uL (ref 0.00–0.07)
Basophils Absolute: 0 10*3/uL (ref 0.0–0.1)
Basophils Relative: 1 %
Eosinophils Absolute: 0.2 10*3/uL (ref 0.0–0.5)
Eosinophils Relative: 2 %
HCT: 42.6 % (ref 36.0–46.0)
Hemoglobin: 14.3 g/dL (ref 12.0–15.0)
Immature Granulocytes: 0 %
Lymphocytes Relative: 22 %
Lymphs Abs: 1.8 10*3/uL (ref 0.7–4.0)
MCH: 29.9 pg (ref 26.0–34.0)
MCHC: 33.6 g/dL (ref 30.0–36.0)
MCV: 89.1 fL (ref 80.0–100.0)
Monocytes Absolute: 0.6 10*3/uL (ref 0.1–1.0)
Monocytes Relative: 8 %
Neutro Abs: 5.5 10*3/uL (ref 1.7–7.7)
Neutrophils Relative %: 67 %
Platelets: 263 10*3/uL (ref 150–400)
RBC: 4.78 MIL/uL (ref 3.87–5.11)
RDW: 13.2 % (ref 11.5–15.5)
WBC: 8.1 10*3/uL (ref 4.0–10.5)
nRBC: 0 % (ref 0.0–0.2)

## 2024-06-19 MED ORDER — SODIUM CHLORIDE 0.9 % IV BOLUS
500.0000 mL | Freq: Once | INTRAVENOUS | Status: AC
Start: 2024-06-19 — End: 2024-06-19
  Administered 2024-06-19: 500 mL via INTRAVENOUS

## 2024-06-19 MED ORDER — CIPROFLOXACIN HCL 500 MG PO TABS: 500.0000 mg | ORAL_TABLET | Freq: Two times a day (BID) | ORAL | 0 refills | Status: DC

## 2024-06-19 MED ORDER — ONDANSETRON HCL 4 MG PO TABS: 4.0000 mg | ORAL_TABLET | Freq: Three times a day (TID) | ORAL | 0 refills | Status: DC | PRN

## 2024-06-19 MED ORDER — METRONIDAZOLE 500 MG PO TABS: 500.0000 mg | ORAL_TABLET | Freq: Two times a day (BID) | ORAL | 0 refills | Status: DC

## 2024-06-19 MED ORDER — ONDANSETRON HCL 4 MG/2ML IJ SOLN
4.0000 mg | Freq: Once | INTRAMUSCULAR | Status: AC
  Administered 2024-06-19: 4 mg via INTRAVENOUS
  Filled 2024-06-19: qty 2

## 2024-06-19 NOTE — Discharge Instructions (Signed)
 You were sent in to the emergency department for nausea and vomiting after starting antibiotics for diverticulitis.  You had a CAT scan that showed uncomplicated diverticulitis.  Unfortunately the treatment of this is antibiotics and you are allergic to the Augmentin.  We will put you on Cipro and metronidazole although the metronidazole has given your problem in the past.  Please keep in touch with your primary care doctor how you are doing.  Stop the metronidazole if it is causing you GI problems.  Return to the emergency department if any high fever or worsening symptoms.

## 2024-06-19 NOTE — ED Provider Notes (Signed)
 [place] [facility] Provider Note   CSN: [id number] Arrival date & time: 06/19/24  1016     Patient presents with: Medication Reaction   Victoria Sharp is a 83 y.o. female.  2 days ago she started with some lower abdominal pain and glassy stools.  She has had diverticulitis before and this is how she usually feels.  She went to her primary care doctor who evaluated her got lab work and put her on Augmentin.  She had 12 episodes of vomiting yesterday.  Was able to start tolerating p.o. overnight.  Today she called PCP office who recommended she come here to get IV fluids.  She does not think she has had a fever.  No urinary symptoms.  No blood in the vomitus.  She thinks there may have been a little bit of blood in the stool.  History of diverticulitis in the past.  Never had a problem with taking Augmentin in the past   The history is provided by the patient.  Emesis Severity:  Moderate Duration:  1 day Timing:  Intermittent Progression:  Resolved Chronicity:  New Associated symptoms: abdominal pain   Associated symptoms: no cough, no fever and no headaches        Prior to Admission medications   Medication Sig Start Date End Date Taking? Authorizing Provider  adalimumab  (HUMIRA , 2 PEN,) 40 MG/0.4ML pen Inject 0.4 mLs (40 mg total) into the skin every 14 (fourteen) days. 1 kit - 2 pens 05/15/24   Dolphus Reiter, MD  amLODipine  (NORVASC ) 2.5 MG tablet Take 5 mg by mouth daily.    [provider]  Calcium Carb-Cholecalciferol (CALCIUM 600 + D PO) Take by mouth.    [provider]  cholecalciferol (VITAMIN D3) 25 MCG (1000 UNIT) tablet Take 1,000 Units by mouth daily.    [provider]  famotidine  (PEPCID ) 20 MG tablet Take by mouth. Patient not taking: Reported on 03/20/2024 12/12/23 12/11/24  [provider]  famotidine  (PEPCID ) 40 MG tablet Take 1 tablet (40 mg total) by mouth daily. 12/02/23 01/01/24   Mannie Pac T, DO  loratadine (CLARITIN) 10 MG tablet Take by mouth.    [provider]  losartan  (COZAAR ) 100 MG tablet Take 100 mg by mouth daily. 10/29/20   [provider]  omeprazole (PRILOSEC) 40 MG capsule Take by mouth. 03/19/24 03/19/25  [provider]  pantoprazole  (PROTONIX ) 20 MG tablet Take 20 mg by mouth daily. Patient not taking: Reported on 03/20/2024 07/05/17   [provider]  Polyethylene Glycol 400 (BLINK TEARS OP) Apply to eye daily as needed.    [provider]  Probiotic Product (ALIGN PO) Take by mouth daily. Patient not taking: Reported on 03/20/2024    [provider]    Allergies: Contrast media [iodinated contrast media], Augmentin [amoxicillin-pot clavulanate], Latex, Leflunomide , Methotrexate derivatives, Metronidazole, and Sulfamethoxazole-trimethoprim    Review of Systems  Constitutional:  Negative for fever.  Respiratory:  Negative for cough.   Cardiovascular:  Negative for chest pain.  Gastrointestinal:  Positive for abdominal pain, blood in stool, nausea and vomiting.  Genitourinary:  Negative for dysuria.  Neurological:  Negative for headaches.    Updated Vital Signs BP 121/80 (BP Location: Left Arm)   Pulse 94   Temp 98.2 F (36.8 C) (Oral)   Resp 20   Ht 5' 2 (1.575 m)   Wt 64.9 kg   SpO2 96%   BMI 26.16 kg/m   Physical  Exam Vitals and nursing note reviewed.  Constitutional:      General: She is not in acute distress.    Appearance: Normal appearance. She is well-developed.  HENT:     Head: Normocephalic and atraumatic.   Eyes:     Conjunctiva/sclera: Conjunctivae normal.    Cardiovascular:     Rate and Rhythm: Normal rate and regular rhythm.     Heart sounds: No murmur heard. Pulmonary:     Effort: Pulmonary effort is normal. No respiratory distress.     Breath sounds: Normal breath sounds. No stridor. No wheezing.  Abdominal:     Palpations: Abdomen is soft.      Tenderness: There is no abdominal tenderness. There is no guarding or rebound.   Musculoskeletal:        General: No tenderness or deformity. Normal range of motion.     Cervical back: Neck supple.   Skin:    General: Skin is warm and dry.   Neurological:     General: No focal deficit present.     Mental Status: She is alert.     GCS: GCS eye subscore is 4. GCS verbal subscore is 5. GCS motor subscore is 6.     Gait: Gait normal.     (all labs ordered are listed, but only abnormal results are displayed) Labs Reviewed  BASIC METABOLIC PANEL WITH GFR - Abnormal; Notable for the following components:      Result Value   Potassium 3.4 (*)    CO2 21 (*)    Glucose, Bld 132 (*)    Creatinine, Ser 1.06 (*)    GFR, Estimated 53 (*)    Anion gap 16 (*)    All other components within normal limits  CBC WITH DIFFERENTIAL/PLATELET  URINALYSIS, ROUTINE W REFLEX MICROSCOPIC    EKG: None  Radiology: CT ABDOMEN PELVIS WO CONTRAST Result Date: 06/19/2024 CLINICAL DATA:  Left lower quadrant abdominal pain. EXAM: CT ABDOMEN AND PELVIS WITHOUT CONTRAST TECHNIQUE: Multidetector CT imaging of the abdomen and pelvis was performed following the standard protocol without IV contrast. RADIATION DOSE REDUCTION: This exam was performed according to the departmental dose-optimization program which includes automated exposure control, adjustment of the mA and/or kV according to patient size and/or use of iterative reconstruction technique. COMPARISON:  CT dated 06/23/2023. FINDINGS: Evaluation of this exam is limited in the absence of intravenous contrast. Lower chest: The visualized lung bases are clear. No intra-abdominal free air or free fluid. Hepatobiliary: Fatty liver. No biliary dilatation. The gallbladder is unremarkable. Pancreas: Unremarkable. No pancreatic ductal dilatation or surrounding inflammatory changes. Spleen: Normal in size without focal abnormality. Adrenals/Urinary Tract: The adrenal  glands are unremarkable. There is no hydronephrosis or nephrolithiasis on either side. The visualized ureters appear unremarkable. The urinary bladder is minimally distended and grossly unremarkable. Stomach/Bowel: There is sigmoid diverticulosis. There is focal area of perisigmoid stranding in the left posterior pelvis consistent with acute diverticulitis. No abscess or perforation. There is no bowel obstruction. The The appendix is not visualized with certainty. No inflammatory changes identified in the right lower quadrant. Vascular/Lymphatic: Mild aortoiliac atherosclerotic disease. The IVC is unremarkable. No portal venous gas. There is no adenopathy. Reproductive: Hysterectomy.  No suspicious adnexal masses. Other: None Musculoskeletal: Osteopenia with degenerative changes spine. Lower lumbar posterior fusion. No acute osseous pathology. IMPRESSION: 1. Acute sigmoid diverticulitis. No abscess or perforation. 2. Fatty liver. 3.  Aortic Atherosclerosis (ICD10-I70.0). Electronically Signed   By: Vanetta Chou M.D.   On: 06/19/2024 11:51  Procedures   Medications Ordered in the ED  sodium chloride  0.9 % bolus 500 mL (0 mLs Intravenous Stopped 06/19/24 1220)  ondansetron  (ZOFRAN ) injection 4 mg (4 mg Intravenous Given 06/19/24 1045)    Clinical Course as of 06/19/24 1715  Wed Jun 19, 2024  1158 Patient's lab work fairly unremarkable.  She is going to try to give a urine sample now.  She lists metronidazole as an allergy.  She is willing to try it again because she does not want to do the Augmentin.  CT showing uncomplicated diverticulitis. [MB]    Clinical Course User Index [MB] Towana Ozell BROCKS, MD                                 Medical Decision Making Amount and/or Complexity of Data Reviewed Labs: ordered. Radiology: ordered.  Risk Prescription drug management.   This patient complains of abdominal pain nausea vomiting; this involves an extensive number of treatment Options  and is a complaint that carries with it a high risk of complications and morbidity. The differential includes diverticulitis, colitis, perforation, abscess, medication intolerance, allergic reaction  I ordered, reviewed and interpreted labs, which included CBC normal, chemistries with mildly low potassium low bicarb elevated glucose and creatinine, urinalysis without signs of infection I ordered medication IV fluids nausea medication and reviewed PMP when indicated. I ordered imaging studies which included CT abdomen and pelvis and I independently    visualized and interpreted imaging which showed acute uncomplicated diverticulitis Previous records obtained and reviewed in epic including recent PCP notes Cardiac monitoring reviewed, sinus rhythm Social determinants considered, no acute barriers Critical Interventions: None  After the interventions stated above, I reevaluated the patient and found patient's symptoms to be improved and tolerating p.o. Admission and further testing considered, she is comfortable with switching antibiotics.  Unfortunately we do not have a great choice for her with her allergies but she is going to try to take the metronidazole.  Return instructions discussed.      Final diagnoses:  Acute diverticulitis    ED Discharge Orders          Ordered    ciprofloxacin (CIPRO) 500 MG tablet  2 times daily        06/19/24 1159    metroNIDAZOLE (FLAGYL) 500 MG tablet  2 times daily        06/19/24 1159    ondansetron  (ZOFRAN ) 4 MG tablet  Every 8 hours PRN        06/19/24 1200               Towana Ozell BROCKS, MD 06/19/24 1720

## 2024-06-19 NOTE — ED Notes (Signed)
 Pt alert and oriented X 4 at the time of discharge. RR even and unlabored. No acute distress noted. Pt verbalized understanding of discharge instructions as discussed. Pt ambulatory to lobby at time of discharge.

## 2024-06-19 NOTE — ED Triage Notes (Signed)
 Pt being treated for diverticulitis but has not tolerated PO Augmentin.  Pt states she had severe vomiting from the medications.  Pt able to keep fluids down today.  Pt states the MD office sent her here for evaluation.

## 2024-06-20 ENCOUNTER — Telehealth: Payer: Self-pay | Admitting: Rheumatology

## 2024-06-20 NOTE — Telephone Encounter (Signed)
 Pt called requesting a call back to see if she really needs this appointment on 06/24/24 with Waddell Craze. Pt wanted Waddell to decide.

## 2024-06-20 NOTE — Telephone Encounter (Signed)
 The appointment on 06/24/2024 is scheduled to assess how she is doing since initiating Humira  on 05/06/24--she can postpone the visit by 1 month if she would like to.

## 2024-06-24 ENCOUNTER — Ambulatory Visit: Admitting: Physician Assistant

## 2024-06-24 DIAGNOSIS — L308 Other specified dermatitis: Secondary | ICD-10-CM

## 2024-06-24 DIAGNOSIS — M19071 Primary osteoarthritis, right ankle and foot: Secondary | ICD-10-CM

## 2024-06-24 DIAGNOSIS — R768 Other specified abnormal immunological findings in serum: Secondary | ICD-10-CM

## 2024-06-24 DIAGNOSIS — M4156 Other secondary scoliosis, lumbar region: Secondary | ICD-10-CM

## 2024-06-24 DIAGNOSIS — M8589 Other specified disorders of bone density and structure, multiple sites: Secondary | ICD-10-CM

## 2024-06-24 DIAGNOSIS — M19041 Primary osteoarthritis, right hand: Secondary | ICD-10-CM

## 2024-06-24 DIAGNOSIS — Z853 Personal history of malignant neoplasm of breast: Secondary | ICD-10-CM

## 2024-06-24 DIAGNOSIS — E559 Vitamin D deficiency, unspecified: Secondary | ICD-10-CM

## 2024-06-24 DIAGNOSIS — Z79899 Other long term (current) drug therapy: Secondary | ICD-10-CM

## 2024-06-24 DIAGNOSIS — I10 Essential (primary) hypertension: Secondary | ICD-10-CM

## 2024-06-24 DIAGNOSIS — I341 Nonrheumatic mitral (valve) prolapse: Secondary | ICD-10-CM

## 2024-06-24 DIAGNOSIS — M545 Low back pain, unspecified: Secondary | ICD-10-CM

## 2024-06-24 DIAGNOSIS — Z8719 Personal history of other diseases of the digestive system: Secondary | ICD-10-CM

## 2024-06-24 DIAGNOSIS — M0579 Rheumatoid arthritis with rheumatoid factor of multiple sites without organ or systems involvement: Secondary | ICD-10-CM

## 2024-07-05 ENCOUNTER — Other Ambulatory Visit: Payer: Self-pay

## 2024-07-05 NOTE — Progress Notes (Signed)
 Specialty Pharmacy Refill Coordination Note  Victoria Sharp is a 83 y.o. female contacted today regarding refills of specialty medication(s) Adalimumab  (Humira  (2 Pen))   Patient requested Delivery   Delivery date: 07/10/24   Verified address: [address] [place]. [address]   Medication will be filled on 07/09/24.

## 2024-07-11 NOTE — Progress Notes (Unsigned)
 Office Visit Note  Patient: Victoria Sharp             Date of Birth: Jan 29, 1941           MRN: [id number]             PCP: Andrew Truman GRADE., MD Referring: Andrew Truman GRADE.,* Visit Date: 07/25/2024 Occupation: @GUAROCC @  Subjective:  Medication monitoring  History of Present Illness: Victoria Sharp is a 83 y.o. female with history of seropositive rheumatoid arthritis and osteoarthritis.  Patient remains on  Humira  40 mg sq injections every 14 days.  She is tolerating Humira  without any side effects or injection site reactions.  She has not had any recent gaps in therapy.  Patient denies any signs or symptoms of a rheumatoid arthritis flare.  Her morning stiffness has been lasting for about 10 minutes daily.  She has not had any nocturnal pain.  She denies any difficulty performing ADLs.  She denies any joint swelling at this time. Patient states that she was diagnosed with diverticulitis in June 2025.  Patient states that she postponed her Humira  injection until the infection had completely resolved and she completed the course of antibiotics.  She had a CT performed on 06/19/2024 revealing fatty liver.  Patient states that she is hoping to lose about 5 more pounds.  Activities of Daily Living:  Patient reports morning stiffness for 10 minutes.   Patient Denies nocturnal pain.  Difficulty dressing/grooming: Denies Difficulty climbing stairs: Denies Difficulty getting out of chair: Denies Difficulty using hands for taps, buttons, cutlery, and/or writing: Denies  Review of Systems  Constitutional:  Negative for fatigue.  HENT:  Negative for mouth sores and mouth dryness.   Eyes:  Negative for dryness.  Respiratory:  Negative for shortness of breath.   Cardiovascular:  Negative for chest pain and palpitations.  Gastrointestinal:  Negative for blood in stool, constipation and diarrhea.  Endocrine: Negative for increased urination.  Genitourinary:  Negative for  involuntary urination.  Musculoskeletal:  Positive for morning stiffness. Negative for joint pain, gait problem, joint pain, joint swelling, myalgias, muscle weakness, muscle tenderness and myalgias.  Skin:  Positive for rash. Negative for color change, hair loss and sensitivity to sunlight.  Allergic/Immunologic: Negative for susceptible to infections.  Neurological:  Negative for dizziness and headaches.  Hematological:  Negative for swollen glands.  Psychiatric/Behavioral:  Negative for depressed mood and sleep disturbance. The patient is nervous/anxious.     PMFS History:  Patient Active Problem List   Diagnosis Date Noted   Spondylolisthesis of lumbar region 03/07/2022   TIA (transient ischemic attack) 01/27/2021   Rheumatoid arthritis with rheumatoid factor of multiple sites without organ or systems involvement (HCC) 06/26/2020   Essential hypertension 06/26/2020   MVP (mitral valve prolapse) 06/26/2020   History of Barrett's esophagus 06/26/2020   History of gastroesophageal reflux (GERD) 06/26/2020   History of breast cancer 06/26/2020   High risk medication use 06/26/2020    Past Medical History:  Diagnosis Date   Barrett's esophagus    Cancer (HCC)    Dermatitis    Diverticulitis    History of breast cancer    History of diverticulitis    Hypertension    Migraines    PONV (postoperative nausea and vomiting)    Rheumatoid arthritis (HCC)    Sacroiliitis (HCC)    right side   Tachycardia    TIA (transient ischemic attack) 01/2020   questionable TIA vs migraine per neurologist  Family History  Problem Relation Age of Onset   Heart attack Father    Kidney disease Brother    Cancer Brother    Past Surgical History:  Procedure Laterality Date   ABDOMINAL HYSTERECTOMY     ABLATION  08/2021   Dr. Eldonna   BREAST LUMPECTOMY Left 1998   NEUROMA SURGERY Right 03/2020   TRANSFORAMINAL LUMBAR INTERBODY FUSION W/ MIS 1 LEVEL N/A 03/07/2022   Procedure: LUMBAR FOUR-  FIVE,  MINIMALLY  INVASIVE TRANSFORAMINAL LUMBAR  INTERBODY FUSION  w/ METRIX;  Surgeon: Cheryle Debby LABOR, MD;  Location: [facility] OR;  Service: Neurosurgery;  Laterality: N/A;  3C/RM 21   WEDGE RESECTION     Social History   Social History Narrative   Lives with husband in home   Right Handed   Drinks coffee for caffeine   Immunization History  Administered Date(s) Administered   Moderna Covid-19 Fall Seasonal Vaccine 19yrs & older 10/07/2022   PFIZER(Purple Top)SARS-COV-2 Vaccination 01/09/2020, 01/30/2020, 09/11/2020, 10/18/2021     Objective: Vital Signs: BP 133/80 (BP Location: Left Arm, Patient Position: Sitting, Cuff Size: Normal)   Pulse 73   Resp 12   Ht 5' 2 (1.575 m)   Wt 144 lb (65.3 kg)   BMI 26.34 kg/m    Physical Exam Vitals and nursing note reviewed.  Constitutional:      Appearance: She is well-developed.  HENT:     Head: Normocephalic and atraumatic.  Eyes:     Conjunctiva/sclera: Conjunctivae normal.  Cardiovascular:     Rate and Rhythm: Normal rate and regular rhythm.     Heart sounds: Normal heart sounds.  Pulmonary:     Effort: Pulmonary effort is normal.     Breath sounds: Normal breath sounds.  Abdominal:     General: Bowel sounds are normal.     Palpations: Abdomen is soft.  Musculoskeletal:     Cervical back: Normal range of motion.  Lymphadenopathy:     Cervical: No cervical adenopathy.  Skin:    General: Skin is warm and dry.     Capillary Refill: Capillary refill takes less than 2 seconds.  Neurological:     Mental Status: She is alert and oriented to person, place, and time.  Psychiatric:        Behavior: Behavior normal.      Musculoskeletal Exam: C-spine, thoracic, lumbar spine good range of motion.  Shoulder joints have good range of motion with no discomfort.  Elbow joints and wrist joints have good range of motion.  No tenderness or synovitis over MCP joints.  PIP and DIP thickening consistent with osteoarthritis of both hands.   Hip joints have good range of motion with no groin pain.  Knee joints have good range of motion no warmth or effusion.  Ankle joints have good range of motion with no tenderness or joint swelling.  Severe first MTP bunion formation noted bilaterally.  PIP and DIP thickening consistent with osteoarthritis of both feet.  CDAI Exam: CDAI Score: -- Patient Global: --; Provider Global: -- Swollen: --; Tender: -- Joint Exam 07/25/2024   No joint exam has been documented for this visit   There is currently no information documented on the homunculus. Go to the Rheumatology activity and complete the homunculus joint exam.  Investigation: No additional findings.  Imaging: No results found.   Recent Labs: Lab Results  Component Value Date   WBC 8.1 06/19/2024   HGB 14.3 06/19/2024   PLT 263 06/19/2024   NA 138  06/19/2024   K 3.4 (L) 06/19/2024   CL 101 06/19/2024   CO2 21 (L) 06/19/2024   GLUCOSE 132 (H) 06/19/2024   BUN 23 06/19/2024   CREATININE 1.06 (H) 06/19/2024   BILITOT 0.7 03/20/2024   ALKPHOS 81 12/02/2023   AST 19 03/20/2024   ALT 13 03/20/2024   PROT 6.9 03/20/2024   ALBUMIN 3.9 12/02/2023   CALCIUM 9.5 06/19/2024   GFRAA 62 06/09/2021   QFTBGOLDPLUS NEGATIVE 07/31/2023    Speciality Comments: Methotrexate-diarrhea, Arava -dermatitis Enbrel  08/2020-11/2023 (stopped due to insurance coverage) Humira  started 05/15/24  Procedures:  No procedures performed Allergies: Contrast media [iodinated contrast media], Augmentin [amoxicillin-pot clavulanate], Latex, Leflunomide , Methotrexate derivatives, Metronidazole , and Sulfamethoxazole-trimethoprim     Assessment / Plan:     Visit Diagnoses: Rheumatoid arthritis with rheumatoid factor of multiple sites without organ or systems involvement (HCC) - Positive RF, positive anti-CCP, positive ANA, synovitis: She has no synovitis on examination today.  She has not had any signs or symptoms of a rheumatoid arthritis flare.  Her  morning stiffness has been lasting for about 10 minutes daily.  She has not had any nocturnal pain or difficulty performing ADLs.  She continues to find Humira  to be effective at managing her symptoms.  She will remain on Humira  40 mg sq injections once every 14 days.  She will notify us  if she develops any new or worsening symptoms.  She will follow-up in the office in 5 months or sooner if needed.  High risk medication use - Humira  40 mg sq injections every 14 days.  CBC updated on 06/19/24.  CMP updated on 06/18/24.  Her next lab work will be due in September and every 3 months.  TB gold negative 07/29/24.  Order for TB gold released today.  Discussed the importance of holding humira  if she develops signs or symptoms of an infection and to resume once the infection has completely cleared.  Last dose of Enbrel  administered on 03/13/2024-- Enbrel  50 mg sq every 21 days (started September 24, 2020)--no longer qualifies for patient assistance.  - Plan: QuantiFERON-TB Gold Plus  Screening for tuberculosis -Order for TB gold released today. Plan: QuantiFERON-TB Gold Plus  Positive ANA (antinuclear antibody): No clinical features of systemic lupus.   Primary osteoarthritis of both hands: PIP and DIP thickening consistent with osteoarthritis of both hands. No synovitis noted.    Primary osteoarthritis of both feet: Severe 1st MTP bunion formation noted.  PIP and DIP thickening consistent with osteoarthritis of both feet.  No synovitis noted. Discussed the importance of wearing proper fitting shoes.  Other secondary scoliosis, lumbar region: Not currently symptomatic. Status post fusion March 07, 2022.   Osteopenia of multiple sites - 05/02/2023 DEXA left femoral neck T-score -1.8, BMD 0.794 g/cm.  She is taking a calcium and vitamin D supplement daily.  Due to update DEXA in May 2026.  Vitamin D deficiency: She is taking a calcium and vitamin D supplement daily.  Fatty liver: Noted on CT abdomen and  pelvis on 06/19/2024--patient is hoping to lose about 5 more pounds.  Discussed the importance of dietary changes.  AST and ALT were within normal limits on 06/18/2024.  History of diverticulitis: Diagnosed in June 2025.  Patient had a CT abdomen and pelvis on 06/19/2024 revealing acute sigmoid diverticulitis.  No abscess or perforation noted. Patient postpone her Humira  injection until the infection is completely resolved and she completed the course of antibiotics.  Other medical conditions are listed as follows:  Essential hypertension:  Blood pressure was elevated today in the office and was rechecked prior to leaving.  Patient was advised to monitor blood pressure closely and to reach out to PCP if her blood pressure remains elevated.  MVP (mitral valve prolapse)  Other eczema: Followed by dermatology.  History of Barrett's esophagus  History of gastroesophageal reflux (GERD)  History of breast cancer   Orders: Orders Placed This Encounter  Procedures   QuantiFERON-TB Gold Plus   No orders of the defined types were placed in this encounter.    Follow-Up Instructions: Return in about 5 months (around 12/25/2024) for Rheumatoid arthritis.   Waddell CHRISTELLA Craze, PA-C  Note - This record has been created using Dragon software.  Chart creation errors have been sought, but may not always  have been located. Such creation errors do not reflect on  the standard of medical care.

## 2024-07-17 NOTE — Addendum Note (Signed)
 Addended by: DAYNE SHERRY RAMAN on: 07/17/2024 10:03 AM   Modules accepted: Level of Service

## 2024-07-19 ENCOUNTER — Telehealth: Payer: Self-pay | Admitting: Rheumatology

## 2024-07-19 NOTE — Telephone Encounter (Signed)
 Contacted patient to advise about her labs, Patient had some labs done on 06/19/2024 and is taking Humira , advised patient that her labs are good until 09/19/2024 .

## 2024-07-19 NOTE — Telephone Encounter (Signed)
 Patient called to check if labwork is needed before her appointment with Waddell on Thursday, 07/25/24.  Patient requested a return call.

## 2024-07-25 ENCOUNTER — Ambulatory Visit: Attending: Physician Assistant | Admitting: Physician Assistant

## 2024-07-25 ENCOUNTER — Encounter: Payer: Self-pay | Admitting: Physician Assistant

## 2024-07-25 VITALS — BP 133/80 | HR 73 | Resp 12 | Ht 62.0 in | Wt 144.0 lb

## 2024-07-25 DIAGNOSIS — K760 Fatty (change of) liver, not elsewhere classified: Secondary | ICD-10-CM | POA: Insufficient documentation

## 2024-07-25 DIAGNOSIS — M19071 Primary osteoarthritis, right ankle and foot: Secondary | ICD-10-CM | POA: Diagnosis present

## 2024-07-25 DIAGNOSIS — Z111 Encounter for screening for respiratory tuberculosis: Secondary | ICD-10-CM | POA: Diagnosis present

## 2024-07-25 DIAGNOSIS — M19041 Primary osteoarthritis, right hand: Secondary | ICD-10-CM | POA: Diagnosis present

## 2024-07-25 DIAGNOSIS — M0579 Rheumatoid arthritis with rheumatoid factor of multiple sites without organ or systems involvement: Secondary | ICD-10-CM | POA: Diagnosis present

## 2024-07-25 DIAGNOSIS — E559 Vitamin D deficiency, unspecified: Secondary | ICD-10-CM | POA: Insufficient documentation

## 2024-07-25 DIAGNOSIS — Z853 Personal history of malignant neoplasm of breast: Secondary | ICD-10-CM | POA: Diagnosis present

## 2024-07-25 DIAGNOSIS — M19042 Primary osteoarthritis, left hand: Secondary | ICD-10-CM | POA: Diagnosis present

## 2024-07-25 DIAGNOSIS — Z8719 Personal history of other diseases of the digestive system: Secondary | ICD-10-CM | POA: Diagnosis present

## 2024-07-25 DIAGNOSIS — M8589 Other specified disorders of bone density and structure, multiple sites: Secondary | ICD-10-CM | POA: Insufficient documentation

## 2024-07-25 DIAGNOSIS — M19072 Primary osteoarthritis, left ankle and foot: Secondary | ICD-10-CM | POA: Insufficient documentation

## 2024-07-25 DIAGNOSIS — R768 Other specified abnormal immunological findings in serum: Secondary | ICD-10-CM | POA: Insufficient documentation

## 2024-07-25 DIAGNOSIS — L308 Other specified dermatitis: Secondary | ICD-10-CM | POA: Diagnosis present

## 2024-07-25 DIAGNOSIS — M545 Low back pain, unspecified: Secondary | ICD-10-CM

## 2024-07-25 DIAGNOSIS — Z79899 Other long term (current) drug therapy: Secondary | ICD-10-CM | POA: Insufficient documentation

## 2024-07-25 DIAGNOSIS — I10 Essential (primary) hypertension: Secondary | ICD-10-CM | POA: Diagnosis present

## 2024-07-25 DIAGNOSIS — M4156 Other secondary scoliosis, lumbar region: Secondary | ICD-10-CM | POA: Insufficient documentation

## 2024-07-25 DIAGNOSIS — I341 Nonrheumatic mitral (valve) prolapse: Secondary | ICD-10-CM | POA: Insufficient documentation

## 2024-07-25 NOTE — Patient Instructions (Signed)
 Standing Labs We placed an order today for your standing lab work.  Please have your standing labs drawn at end of September and every 3 months   Please have your labs drawn 2 weeks prior to your appointment so that the provider can discuss your lab results at your appointment, if possible.  Please note that you may see your imaging and lab results in MyChart before we have reviewed them. We will contact you once all results are reviewed. Please allow our office up to 72 hours to thoroughly review all of the results before contacting the office for clarification of your results.  WALK-IN LAB HOURS  Monday through Thursday from 8:00 am -12:30 pm and 1:00 pm-4:30 pm and Friday from 8:00 am-12:00 pm.  Patients with office visits requiring labs will be seen before walk-in labs.  You may encounter longer than normal wait times. Please allow additional time. Wait times may be shorter on  Monday and Thursday afternoons.  We do not book appointments for walk-in labs. We appreciate your patience and understanding with our staff.   Labs are drawn by [facility]. Please bring your co-pay at the time of your lab draw.  You may receive a bill from [facility] for your lab work.  Please note if you are on Hydroxychloroquine and and an order has been placed for a Hydroxychloroquine level,  you will need to have it drawn 4 hours or more after your last dose.  If you wish to have your labs drawn at another location, please call the office 24 hours in advance so we can fax the orders.  The office is located at [address], [address], [place], [place] [address]   If you have any questions regarding directions or hours of operation,  please call [phone].   As a reminder, please drink plenty of water prior to coming for your lab work. Thanks!

## 2024-07-30 ENCOUNTER — Encounter (HOSPITAL_COMMUNITY): Payer: Self-pay

## 2024-07-30 ENCOUNTER — Ambulatory Visit: Payer: Self-pay | Admitting: Physician Assistant

## 2024-07-30 ENCOUNTER — Other Ambulatory Visit (HOSPITAL_COMMUNITY): Payer: Self-pay

## 2024-07-30 LAB — QUANTIFERON-TB GOLD PLUS
Mitogen-NIL: >10 [IU]/mL
NIL: 0.16 [IU]/mL
QuantiFERON-TB Gold Plus: NEGATIVE
TB1-NIL: <0 [IU]/mL
TB2-NIL: 0.05 [IU]/mL

## 2024-07-30 NOTE — Progress Notes (Signed)
 TB gold negative

## 2024-08-01 ENCOUNTER — Other Ambulatory Visit: Payer: Self-pay | Admitting: Rheumatology

## 2024-08-01 ENCOUNTER — Other Ambulatory Visit (HOSPITAL_COMMUNITY): Payer: Self-pay

## 2024-08-01 ENCOUNTER — Other Ambulatory Visit: Payer: Self-pay

## 2024-08-01 DIAGNOSIS — Z79899 Other long term (current) drug therapy: Secondary | ICD-10-CM

## 2024-08-01 DIAGNOSIS — M0579 Rheumatoid arthritis with rheumatoid factor of multiple sites without organ or systems involvement: Secondary | ICD-10-CM

## 2024-08-01 MED ORDER — HUMIRA (2 PEN) 40 MG/0.4ML ~~LOC~~ AJKT
40.0000 mg | AUTO-INJECTOR | SUBCUTANEOUS | 1 refills | Status: DC
  Filled 2024-08-01: qty 2, 28d supply, fill #0
  Filled 2024-08-29 – 2024-09-30 (×3): qty 2, 28d supply, fill #1

## 2024-08-01 NOTE — Progress Notes (Signed)
 Specialty Pharmacy Refill Coordination Note  Victoria Sharp is a 83 y.o. female contacted today regarding refills of specialty medication(s) Adalimumab  (Humira  (2 Pen))   Patient requested Delivery   Delivery date: 08/07/24   Verified address: [address] [place]. [address]   Medication will be filled on 08/06/24.   This fill date is pending response to refill request from provider. Patient is aware and if they have not received fill by intended date they must follow up with pharmacy.

## 2024-08-01 NOTE — Telephone Encounter (Signed)
 Last Fill: 05/15/2024  Labs:06/19/2024 CBC w/ Diff WNL CMP WNL eGFR 50  TB Gold: 07/25/2024 Negative   Next Visit: 01/10/2025  Last Visit: 07/25/2024  IK:Myzlfjunpi arthritis with rheumatoid factor of multiple sites without organ or systems involvement  Current Dose per office note 07/25/2024: Humira  40 mg sq injections once every 14 days.   Okay to refill Humira ?

## 2024-08-29 ENCOUNTER — Other Ambulatory Visit: Payer: Self-pay

## 2024-09-02 ENCOUNTER — Telehealth: Payer: Self-pay | Admitting: Rheumatology

## 2024-09-02 ENCOUNTER — Other Ambulatory Visit: Payer: Self-pay

## 2024-09-02 NOTE — Telephone Encounter (Signed)
 Pt called to inform Dr Dolphus that she is currently taking Doxycycline 100mg  twice a day and is not taking the Humira  until after her eye infection is better and is done with the medication

## 2024-09-25 ENCOUNTER — Other Ambulatory Visit: Payer: Self-pay

## 2024-09-27 ENCOUNTER — Other Ambulatory Visit: Payer: Self-pay

## 2024-09-30 ENCOUNTER — Other Ambulatory Visit: Payer: Self-pay

## 2024-09-30 NOTE — Progress Notes (Signed)
 Specialty Pharmacy Refill Coordination Note  Victoria Sharp is a 83 y.o. female contacted today regarding refills of specialty medication(s) Adalimumab  (Humira  (2 Pen))   Patient requested Delivery   Delivery date: 10/09/24   Verified address: [address] [place]. [address]   Medication will be filled on 10/08/24.

## 2024-10-08 ENCOUNTER — Other Ambulatory Visit: Payer: Self-pay

## 2024-10-29 ENCOUNTER — Other Ambulatory Visit: Payer: Self-pay

## 2024-10-29 ENCOUNTER — Other Ambulatory Visit (HOSPITAL_COMMUNITY): Payer: Self-pay

## 2024-10-29 ENCOUNTER — Other Ambulatory Visit: Payer: Self-pay | Admitting: Physician Assistant

## 2024-10-29 DIAGNOSIS — Z79899 Other long term (current) drug therapy: Secondary | ICD-10-CM

## 2024-10-29 DIAGNOSIS — M0579 Rheumatoid arthritis with rheumatoid factor of multiple sites without organ or systems involvement: Secondary | ICD-10-CM

## 2024-10-29 MED ORDER — HUMIRA (2 PEN) 40 MG/0.4ML ~~LOC~~ AJKT
40.0000 mg | AUTO-INJECTOR | SUBCUTANEOUS | 2 refills | Status: AC
  Filled 2024-10-29 – 2024-11-04 (×3): qty 2, 28d supply, fill #0
  Filled 2024-11-28: qty 2, 28d supply, fill #1
  Filled 2025-01-01 – 2025-01-07 (×3): qty 2, 28d supply, fill #2

## 2024-10-29 NOTE — Telephone Encounter (Signed)
 Last Fill: 08/01/2024  Labs: 10/22/2024 GFR 51, MPV 10.4  TB Gold: 07/25/2024 Neg    Next Visit: 01/10/2025  Last Visit: 07/25/2024  DX: Rheumatoid arthritis with rheumatoid factor of multiple sites without organ or systems involvement    Current Dose per office note 07/25/2024: Humira  40 mg sq injections every 14 days.   Okay to refill Humira ?

## 2024-10-31 ENCOUNTER — Other Ambulatory Visit (HOSPITAL_COMMUNITY): Payer: Self-pay

## 2024-10-31 ENCOUNTER — Other Ambulatory Visit: Payer: Self-pay

## 2024-11-04 ENCOUNTER — Other Ambulatory Visit (HOSPITAL_COMMUNITY): Payer: Self-pay

## 2024-11-04 ENCOUNTER — Other Ambulatory Visit: Payer: Self-pay

## 2024-11-04 NOTE — Progress Notes (Signed)
 Specialty Pharmacy Refill Coordination Note  Victoria Sharp is a 83 y.o. female contacted today regarding refills of specialty medication(s) Adalimumab  (Humira  (2 Pen))   Patient requested Delivery   Delivery date: 11/08/24   Verified address: [address] [place]. [address]   Medication will be filled on: 11/07/24

## 2024-11-06 ENCOUNTER — Other Ambulatory Visit: Payer: Self-pay

## 2024-11-11 ENCOUNTER — Telehealth: Payer: Self-pay

## 2024-11-11 NOTE — Telephone Encounter (Addendum)
 Received fax from [facility] stating that pt's Humira  PA is due for renewal and that a case had already been created on a portal called [employer].  Submitted a Prior Authorization request to [employer] for HUMIRA  via [employer]. Per response, PA has been approved until 12/25/2024. Will await PA approval letter.  Case #: [id number] P Case ID: [id number]

## 2024-11-28 ENCOUNTER — Other Ambulatory Visit (HOSPITAL_COMMUNITY): Payer: Self-pay

## 2024-12-02 ENCOUNTER — Other Ambulatory Visit: Payer: Self-pay

## 2024-12-02 ENCOUNTER — Other Ambulatory Visit (HOSPITAL_COMMUNITY): Payer: Self-pay

## 2024-12-02 NOTE — Progress Notes (Signed)
 Specialty Pharmacy Refill Coordination Note  Victoria Sharp is a 83 y.o. female contacted today regarding refills of specialty medication(s) Adalimumab  (Humira  (2 Pen))   Patient requested Delivery   Delivery date: 12/24/24   Verified address: [address] [place]. [address]   Medication will be filled on: 12/23/24  Patient mentioned plans formulary will be changing next year and will not prefer Humira . Filling before 12.31.25.

## 2024-12-03 ENCOUNTER — Other Ambulatory Visit (HOSPITAL_COMMUNITY): Payer: Self-pay

## 2024-12-03 ENCOUNTER — Other Ambulatory Visit: Payer: Self-pay

## 2024-12-09 NOTE — Telephone Encounter (Signed)
 Pt was seen 06/13/2024

## 2024-12-11 ENCOUNTER — Other Ambulatory Visit: Payer: Self-pay

## 2024-12-29 NOTE — Progress Notes (Signed)
 "  Office Visit Note  Patient: Victoria Sharp             Date of Birth: 1941/10/26           MRN: [id number]             PCP: Andrew Truman GRADE., MD Referring: Andrew Truman GRADE., MD Visit Date: 01/10/2025 Occupation: Data Unavailable  Subjective:  Medication management  History of Present Illness: Victoria Sharp is a 84 y.o. female with seropositive rheumatoid arthritis and osteoarthritis.  She returns today after her last visit in July 2025.  She states she is not had any joint swelling or joint pain.  She notices some cramps in her hands.  She has had no recurrence of diverticulitis since June 2025.  She has been taking Humira  shots every other week without any interruption.  She denies any history of infections since the last visit.    Activities of Daily Living:  Patient reports morning stiffness for a few minutes.   Patient Denies nocturnal pain.  Difficulty dressing/grooming: Denies Difficulty climbing stairs: Denies Difficulty getting out of chair: Denies Difficulty using hands for taps, buttons, cutlery, and/or writing: Denies  Review of Systems  Constitutional:  Negative for fatigue.  HENT:  Negative for mouth sores and mouth dryness.   Eyes:  Positive for dryness.  Respiratory:  Negative for shortness of breath.   Cardiovascular:  Negative for chest pain and palpitations.  Gastrointestinal:  Negative for blood in stool, constipation and diarrhea.  Endocrine: Negative for increased urination.  Genitourinary:  Negative for involuntary urination.  Musculoskeletal:  Positive for joint pain, joint pain, myalgias, morning stiffness and myalgias. Negative for gait problem, joint swelling, muscle weakness and muscle tenderness.  Skin:  Negative for color change, rash, hair loss and sensitivity to sunlight.  Allergic/Immunologic: Negative for susceptible to infections.  Neurological:  Negative for dizziness and headaches.  Hematological:  Negative for swollen  glands.  Psychiatric/Behavioral:  Negative for depressed mood and sleep disturbance. The patient is not nervous/anxious.     PMFS History:  Patient Active Problem List   Diagnosis Date Noted   Spondylolisthesis of lumbar region 03/07/2022   TIA (transient ischemic attack) 01/27/2021   Rheumatoid arthritis with rheumatoid factor of multiple sites without organ or systems involvement (HCC) 06/26/2020   Essential hypertension 06/26/2020   MVP (mitral valve prolapse) 06/26/2020   History of Barrett's esophagus 06/26/2020   History of gastroesophageal reflux (GERD) 06/26/2020   History of breast cancer 06/26/2020   High risk medication use 06/26/2020    Past Medical History:  Diagnosis Date   Barrett's esophagus    Cancer (HCC)    Dermatitis    Diverticulitis    History of breast cancer    History of diverticulitis    Hypertension    Migraines    PONV (postoperative nausea and vomiting)    Rheumatoid arthritis (HCC)    Sacroiliitis    right side   Tachycardia    TIA (transient ischemic attack) 01/2020   questionable TIA vs migraine per neurologist    Family History  Problem Relation Age of Onset   Heart attack Father    Kidney disease Brother    Cancer Brother    Past Surgical History:  Procedure Laterality Date   ABDOMINAL HYSTERECTOMY     ABLATION  08/2021   Dr. Eldonna   BREAST LUMPECTOMY Left 1998   NEUROMA SURGERY Right 03/2020   TRANSFORAMINAL LUMBAR INTERBODY FUSION W/ MIS 1 LEVEL  N/A 03/07/2022   Procedure: LUMBAR FOUR- FIVE,  MINIMALLY  INVASIVE TRANSFORAMINAL LUMBAR  INTERBODY FUSION  w/ METRIX;  Surgeon: Cheryle Debby LABOR, MD;  Location: [facility] OR;  Service: Neurosurgery;  Laterality: N/A;  3C/RM 21   WEDGE RESECTION     Social History[1] Social History   Social History Narrative   Lives with husband in home   Right Handed   Drinks coffee for caffeine     Immunization History  Administered Date(s) Administered   Moderna Covid-19 Fall Seasonal Vaccine  80yrs & older 10/07/2022   PFIZER(Purple Top)SARS-COV-2 Vaccination 01/09/2020, 01/30/2020, 09/11/2020, 10/18/2021     Objective: Vital Signs: BP (!) 159/103   Pulse 70   Temp 97.9 F (36.6 C)   Resp 13   Ht 5' 2 (1.575 m)   Wt 148 lb 9.6 oz (67.4 kg)   BMI 27.18 kg/m    Physical Exam Vitals and nursing note reviewed.  Constitutional:      Appearance: She is well-developed.  HENT:     Head: Normocephalic and atraumatic.  Eyes:     Conjunctiva/sclera: Conjunctivae normal.  Cardiovascular:     Rate and Rhythm: Normal rate and regular rhythm.     Heart sounds: Normal heart sounds.  Pulmonary:     Effort: Pulmonary effort is normal.     Breath sounds: Normal breath sounds.  Abdominal:     General: Bowel sounds are normal.     Palpations: Abdomen is soft.  Musculoskeletal:     Cervical back: Normal range of motion.  Skin:    General: Skin is warm and dry.     Capillary Refill: Capillary refill takes less than 2 seconds.  Neurological:     Mental Status: She is alert and oriented to person, place, and time.  Psychiatric:        Behavior: Behavior normal.      Musculoskeletal Exam: Cervical, thoracic and lumbar spine were in good range of motion.  There was no SI joint tenderness.  Shoulder joints, elbow joints, wrist joints, MCPs, PIPs and DIPs were in good range of motion with no synovitis.  Hip joints and knee joints were in good range of motion without any warmth swelling or effusion.  There was no tenderness over ankles or MTPs.   CDAI Exam: CDAI Score: -- Patient Global: 20 / 100; Provider Global: 0 / 100 Swollen: --; Tender: -- Joint Exam 01/10/2025   No joint exam has been documented for this visit   There is currently no information documented on the homunculus. Go to the Rheumatology activity and complete the homunculus joint exam.  Investigation: No additional findings.  Imaging: No results found.  Recent Labs: Lab Results  Component Value Date    WBC 8.1 06/19/2024   HGB 14.3 06/19/2024   PLT 263 06/19/2024   NA 138 06/19/2024   K 3.4 (L) 06/19/2024   CL 101 06/19/2024   CO2 21 (L) 06/19/2024   GLUCOSE 132 (H) 06/19/2024   BUN 23 06/19/2024   CREATININE 1.06 (H) 06/19/2024   BILITOT 0.7 03/20/2024   ALKPHOS 81 12/02/2023   AST 19 03/20/2024   ALT 13 03/20/2024   PROT 6.9 03/20/2024   ALBUMIN 3.9 12/02/2023   CALCIUM 9.5 06/19/2024   GFRAA 62 06/09/2021   QFTBGOLDPLUS NEGATIVE 07/25/2024    Speciality Comments: Methotrexate-diarrhea, Arava -dermatitis Enbrel  08/2020-11/2023 (stopped due to insurance coverage) Humira  started 05/15/24  Procedures:  No procedures performed Allergies: Contrast media [iodinated contrast media], Augmentin [amoxicillin-pot clavulanate], Latex, Leflunomide ,  Methotrexate and trimetrexate, Metronidazole , and Sulfamethoxazole-trimethoprim   Assessment / Plan:     Visit Diagnoses: Rheumatoid arthritis with rheumatoid factor of multiple sites without organ or systems involvement (HCC) - Positive RF, positive anti-CCP, positive ANA, synovitis: Patient had no synovitis on the examination.  She denies any history of joint pain or joint swelling.  She states she gets muscle cramps in her hands.  She does not recall when she had rheumatoid arthritis flare.  She states has been at least more than 2 years.  I discussed the option of spacing Humira  to every 3 weeks.  She was in agreement.  I advised her if she has a flare on every 3 weeks dosing then she can go back to every 2 weeks dosing.  High risk medication use - Humira  40 mg sq injections every 14 days 05/15/24.Enbrel  dcd 03/13/2024-- Enbrel  50 mg sq every 21 days (started September 24, 2020)lost patient assistance. -June 19, 2024 CBC was normal.  March 20, 2024 CMP was normal.  TB Gold was negative on July 25, 2024.  She was advised to get labs every 3 months.  Information reimmunization was placed in the areas.  She was advised to hold Humira  if she develops  an infection and resume after the infection resolves.  Annual skin examination to screen for skin cancer was advised.  Use of sunscreen and sun protection was discussed.  Plan: CBC with Differential/Platelet, Comprehensive metabolic panel with GFR  Primary osteoarthritis of both hands-bilateral PIP and DIP thickening was noted.  She states she has muscle cramps in her hands.  Joint protection muscle strengthening was discussed.  Primary osteoarthritis of both feet-proper fitting shoes were advised.  She had no tenderness synovitis over her ankles or MTPs.  Other secondary scoliosis, lumbar region-she had good mobility in her lumbar spine.  Lumbar spondylosis - Status post fusion March 07, 2022.  She denies any discomfort or radiculopathy.  Osteopenia of multiple sites - 05/02/2023 DEXA left femoral neck T-score -1.8, BMD 0.794 g/cm.  Calcium rich diet and vitamin D was advised.  Vitamin D deficiency  Essential hypertension-blood pressure was elevated at 159/103.  Repeat blood pressure was 162/92.  She was advised to monitor blood pressure closely and follow-up with her PCP.  MVP (mitral valve prolapse)  History of Barrett's esophagus  History of gastroesophageal reflux (GERD)  Fatty liver - CT abdomen and pelvis on 06/19/2024  History of diverticulitis  History of breast cancer  Other eczema  Orders: Orders Placed This Encounter  Procedures   CBC with Differential/Platelet   Comprehensive metabolic panel with GFR   No orders of the defined types were placed in this encounter.   Follow-Up Instructions: Return in about 5 months (around 06/10/2025) for Rheumatoid arthritis.   Maya Nash, MD  Note - This record has been created using Animal nutritionist.  Chart creation errors have been sought, but may not always  have been located. Such creation errors do not reflect on  the standard of medical care.     [1]  Social History Tobacco Use   Smoking status: Never     Passive exposure: Never   Smokeless tobacco: Never  Vaping Use   Vaping status: Never Used  Substance Use Topics   Alcohol  use: Yes    Comment: rarely   Drug use: Never   "

## 2025-01-01 ENCOUNTER — Other Ambulatory Visit: Payer: Self-pay

## 2025-01-03 ENCOUNTER — Other Ambulatory Visit: Payer: Self-pay

## 2025-01-07 ENCOUNTER — Telehealth: Payer: Self-pay | Admitting: Pharmacist

## 2025-01-07 ENCOUNTER — Other Ambulatory Visit: Payer: Self-pay

## 2025-01-07 NOTE — Progress Notes (Unsigned)
 Called patient regarding Humira . She should like to apply for [employer] PAP. She has appt on 01/10/2025 wth Dr Dolphus - she will plan to complete at [facility] then. Application printed  Arda Daggs, PharmD, MPH, BCPS, CPP Clinical Pharmacist

## 2025-01-07 NOTE — Telephone Encounter (Signed)
 Patient's grant for Humira  expired.  Called patient regarding Humira . She should like to apply for [employer] PAP. She has appt on 01/10/2025 wth Dr Dolphus - she will plan to complete at [facility] then. Application printed  Skyanne Welle, PharmD, MPH, BCPS, CPP Clinical Pharmacist

## 2025-01-10 ENCOUNTER — Encounter: Payer: Self-pay | Admitting: Rheumatology

## 2025-01-10 ENCOUNTER — Other Ambulatory Visit (HOSPITAL_COMMUNITY): Payer: Self-pay

## 2025-01-10 ENCOUNTER — Ambulatory Visit: Admitting: Rheumatology

## 2025-01-10 VITALS — BP 161/92 | HR 74 | Temp 97.9°F | Resp 13 | Ht 62.0 in | Wt 148.6 lb

## 2025-01-10 DIAGNOSIS — M19041 Primary osteoarthritis, right hand: Secondary | ICD-10-CM | POA: Insufficient documentation

## 2025-01-10 DIAGNOSIS — M19072 Primary osteoarthritis, left ankle and foot: Secondary | ICD-10-CM | POA: Diagnosis present

## 2025-01-10 DIAGNOSIS — Z8719 Personal history of other diseases of the digestive system: Secondary | ICD-10-CM | POA: Insufficient documentation

## 2025-01-10 DIAGNOSIS — M47816 Spondylosis without myelopathy or radiculopathy, lumbar region: Secondary | ICD-10-CM | POA: Insufficient documentation

## 2025-01-10 DIAGNOSIS — M8589 Other specified disorders of bone density and structure, multiple sites: Secondary | ICD-10-CM | POA: Insufficient documentation

## 2025-01-10 DIAGNOSIS — M19071 Primary osteoarthritis, right ankle and foot: Secondary | ICD-10-CM | POA: Diagnosis not present

## 2025-01-10 DIAGNOSIS — M0579 Rheumatoid arthritis with rheumatoid factor of multiple sites without organ or systems involvement: Secondary | ICD-10-CM | POA: Insufficient documentation

## 2025-01-10 DIAGNOSIS — M4156 Other secondary scoliosis, lumbar region: Secondary | ICD-10-CM | POA: Insufficient documentation

## 2025-01-10 DIAGNOSIS — M19042 Primary osteoarthritis, left hand: Secondary | ICD-10-CM | POA: Insufficient documentation

## 2025-01-10 DIAGNOSIS — I341 Nonrheumatic mitral (valve) prolapse: Secondary | ICD-10-CM | POA: Diagnosis present

## 2025-01-10 DIAGNOSIS — I10 Essential (primary) hypertension: Secondary | ICD-10-CM | POA: Insufficient documentation

## 2025-01-10 DIAGNOSIS — E559 Vitamin D deficiency, unspecified: Secondary | ICD-10-CM | POA: Insufficient documentation

## 2025-01-10 DIAGNOSIS — K760 Fatty (change of) liver, not elsewhere classified: Secondary | ICD-10-CM | POA: Diagnosis present

## 2025-01-10 DIAGNOSIS — Z853 Personal history of malignant neoplasm of breast: Secondary | ICD-10-CM | POA: Insufficient documentation

## 2025-01-10 DIAGNOSIS — L308 Other specified dermatitis: Secondary | ICD-10-CM | POA: Diagnosis present

## 2025-01-10 DIAGNOSIS — Z79899 Other long term (current) drug therapy: Secondary | ICD-10-CM | POA: Insufficient documentation

## 2025-01-10 NOTE — Patient Instructions (Addendum)
 You may space Victoria Sharp  Victoria Sharp  injections to every 3 weeks  Standing Labs We placed an order today for your standing lab work.   Please have your standing labs drawn in April and every 3 months  Please have your labs drawn 2 weeks prior to your appointment so that the provider can discuss your lab results at your appointment, if possible.  Please note that you may see your imaging and lab results in MyChart before we have reviewed them. We will contact you once all results are reviewed. Please allow our office up to 72 hours to thoroughly review all of the results before contacting the office for clarification of your results.  WALK-IN LAB HOURS  Monday through Thursday from 8:00 am - 4:30 pm and Friday from 8:00 am-12:00 pm.  Patients with office visits requiring labs will be seen before walk-in labs.  You may encounter longer than normal wait times. Please allow additional time. Wait times may be shorter on  Monday and Thursday afternoons.  We do not book appointments for walk-in labs. We appreciate your patience and understanding with our staff.   Labs are drawn by [facility]. Please bring your co-pay at the time of your lab draw.  You may receive a bill from [facility] for your lab work.  Please note if you are on Hydroxychloroquine and and an order has been placed for a Hydroxychloroquine level,  you will need to have it drawn 4 hours or more after your last dose.  If you wish to have your labs drawn at another location, please call the office 24 hours in advance so we can fax the orders.  The office is located at [address], [place], [place] [address]   If you have any questions regarding directions or hours of operation,  please call [phone].   As a reminder, please drink plenty of water prior to coming for your lab work. Thanks!   Vaccines You are taking a medication(s) that can suppress your immune system.  The following immunizations are recommended: Flu  annually Covid-19  RSV Td/Tdap (tetanus, diphtheria, pertussis) every 10 years Pneumonia (Prevnar 15 then Pneumovax 23 at least 1 year apart.  Alternatively, can take Prevnar 20 without needing additional dose) Shingrix: 2 doses from 4 weeks to 6 months apart  Please check with your PCP to make sure you are up to date.   If you have signs or symptoms of an infection or start antibiotics: First, call your PCP for workup of your infection. Hold your medication through the infection, until you complete your antibiotics, and until symptoms resolve if you take the following: Injectable medication (Actemra, Benlysta, Cimzia, Cosentyx, Enbrel , Victoria Sharp , Kevzara, Orencia, Remicade, Simponi, Stelara, Taltz, Tremfya) Methotrexate Leflunomide  (Arava ) Mycophenolate (Cellcept) Xeljanz, Olumiant, or Rinvoq   Please get an annual skin examination to screen for skin cancer by your dermatologist.  Please use sunscreen and sun protection.

## 2025-01-10 NOTE — Telephone Encounter (Signed)
 Patient signed paperwork while she was here in the clinic. Unfortunately her insurance would require clinical documentation explaining rationale as to why the pt could NOT try the formulary alternatives, which we would not be able to provide. I contacted the pt and advised that I would be submitting a PA for formulary alternative Simlandi instead. She verbalized understanding and provided consent to complete the paperwork on her behalf.  Submitted a Prior Authorization request to [employer] for [facility] via CoverMyMeds, however request was CANCELLED stating that no shara is required. Test claim reveals copay of $690.75, will proceed with PAP submission.  Key: [id number]   Application has been completed and signed and all supporting documents have been gathered. Documents sent to [facility], submission is pending pt returning call to provide household size and annual income amounts.

## 2025-01-10 NOTE — Telephone Encounter (Signed)
 Submitted Patient Assistance Application to [employer] for [facility] along with patient portion, provider portion, insurance card copy, prior authorization approval, and current medication list. Will update patient when we receive a response.  Phone#: [phone] Fax#: [phone]

## 2025-01-11 LAB — CBC WITH DIFFERENTIAL/PLATELET
Absolute Lymphocytes: 2039 {cells}/uL (ref 850–3900)
Absolute Monocytes: 568 {cells}/uL (ref 200–950)
Basophils Absolute: 79 {cells}/uL (ref 0–200)
Basophils Relative: 1.2 %
Eosinophils Absolute: 409 {cells}/uL (ref 15–500)
Eosinophils Relative: 6.2 %
HCT: 42.8 % (ref 35.9–46.0)
Hemoglobin: 14 g/dL (ref 11.7–15.5)
MCH: 30 pg (ref 27.0–33.0)
MCHC: 32.7 g/dL (ref 31.6–35.4)
MCV: 91.8 fL (ref 81.4–101.7)
MPV: 11.5 fL (ref 7.5–12.5)
Monocytes Relative: 8.6 %
Neutro Abs: 3505 {cells}/uL (ref 1500–7800)
Neutrophils Relative %: 53.1 %
Platelets: 272 Thousand/uL (ref 140–400)
RBC: 4.66 Million/uL (ref 3.80–5.10)
RDW: 12 % (ref 11.0–15.0)
Total Lymphocyte: 30.9 %
WBC: 6.6 Thousand/uL (ref 3.8–10.8)

## 2025-01-11 LAB — COMPREHENSIVE METABOLIC PANEL WITH GFR
AG Ratio: 1.4 (calc) (ref 1.0–2.5)
ALT: 8 U/L (ref 6–29)
AST: 16 U/L (ref 10–35)
Albumin: 4.2 g/dL (ref 3.6–5.1)
Alkaline phosphatase (APISO): 85 U/L (ref 37–153)
BUN/Creatinine Ratio: 24 (calc) — ABNORMAL HIGH (ref 6–22)
BUN: 23 mg/dL (ref 7–25)
CO2: 28 mmol/L (ref 20–32)
Calcium: 9.7 mg/dL (ref 8.6–10.4)
Chloride: 104 mmol/L (ref 98–110)
Creat: 0.97 mg/dL — ABNORMAL HIGH (ref 0.60–0.95)
Globulin: 2.9 g/dL (ref 1.9–3.7)
Glucose, Bld: 91 mg/dL (ref 65–99)
Potassium: 5.2 mmol/L (ref 3.5–5.3)
Sodium: 138 mmol/L (ref 135–146)
Total Bilirubin: 0.5 mg/dL (ref 0.2–1.2)
Total Protein: 7.1 g/dL (ref 6.1–8.1)
eGFR: 59 mL/min/1.73m2 — ABNORMAL LOW

## 2025-01-12 ENCOUNTER — Ambulatory Visit: Payer: Self-pay | Admitting: Rheumatology

## 2025-01-12 NOTE — Progress Notes (Signed)
 GFR is low and stable.  CBC is normal.  Please forward results to her PCP.

## 2025-01-15 NOTE — Telephone Encounter (Signed)
 Received fax from [facility] for [facility] patient assistance, patient's application has been DENIED due to Unsupported Product. Will reach out to PAP company to confirm what exactly this means, as no additional information has been provided.   Phone#: [phone]

## 2025-01-17 NOTE — Telephone Encounter (Unsigned)
 Contacted [employer] for more information regarding denial. Per rep, Simlandi is NOT a product that is covered by their program despite being a [employer] product. The rep could not tell me who it is that provides patient assistance for this medication as she claims she has never heard of it.   Doing a general online search for Simlandi PAP leads me to a page that no longer exists.

## 2025-06-19 ENCOUNTER — Ambulatory Visit: Admitting: Rheumatology
